# Patient Record
Sex: Female | Born: 1950
Health system: Southern US, Community
[De-identification: ages and names within clinical notes are randomized; demographics above are authoritative.]

## PROBLEM LIST (undated history)

## (undated) ENCOUNTER — Inpatient Hospital Stay: Admission: AD | Payer: Self-pay | Source: Ambulatory Visit | Admitting: Internal Medicine

## (undated) ENCOUNTER — Inpatient Hospital Stay: Payer: Self-pay | Admitting: Family Medicine

## (undated) DIAGNOSIS — Z828 Family history of other disabilities and chronic diseases leading to disablement, not elsewhere classified: Secondary | ICD-10-CM

## (undated) DIAGNOSIS — F32A Depression, unspecified: Secondary | ICD-10-CM

## (undated) DIAGNOSIS — E785 Hyperlipidemia, unspecified: Secondary | ICD-10-CM

## (undated) DIAGNOSIS — I471 Supraventricular tachycardia, unspecified: Secondary | ICD-10-CM

## (undated) DIAGNOSIS — C50919 Malignant neoplasm of unspecified site of unspecified female breast: Secondary | ICD-10-CM

## (undated) DIAGNOSIS — I10 Essential (primary) hypertension: Secondary | ICD-10-CM

## (undated) DIAGNOSIS — E039 Hypothyroidism, unspecified: Secondary | ICD-10-CM

## (undated) DIAGNOSIS — Z9289 Personal history of other medical treatment: Secondary | ICD-10-CM

## (undated) DIAGNOSIS — R002 Palpitations: Secondary | ICD-10-CM

## (undated) DIAGNOSIS — Z9889 Other specified postprocedural states: Secondary | ICD-10-CM

## (undated) DIAGNOSIS — Z720 Tobacco use: Secondary | ICD-10-CM

## (undated) DIAGNOSIS — F329 Major depressive disorder, single episode, unspecified: Secondary | ICD-10-CM

## (undated) DIAGNOSIS — K219 Gastro-esophageal reflux disease without esophagitis: Secondary | ICD-10-CM

## (undated) DIAGNOSIS — F419 Anxiety disorder, unspecified: Secondary | ICD-10-CM

## (undated) DIAGNOSIS — I340 Nonrheumatic mitral (valve) insufficiency: Secondary | ICD-10-CM

## (undated) HISTORY — DX: Major depressive disorder, single episode, unspecified: F32.9

## (undated) HISTORY — DX: Malignant neoplasm of unspecified site of unspecified female breast: C50.919

## (undated) HISTORY — DX: Palpitations: R00.2

## (undated) HISTORY — DX: Supraventricular tachycardia, unspecified: I47.10

## (undated) HISTORY — DX: Hypothyroidism, unspecified: E03.9

## (undated) HISTORY — DX: Essential (primary) hypertension: I10

## (undated) HISTORY — DX: Gastro-esophageal reflux disease without esophagitis: K21.9

## (undated) HISTORY — DX: Family history of other disabilities and chronic diseases leading to disablement, not elsewhere classified: Z82.8

## (undated) HISTORY — DX: Tobacco use: Z72.0

## (undated) HISTORY — DX: Supraventricular tachycardia: I47.1

## (undated) HISTORY — DX: Personal history of other medical treatment: Z92.89

## (undated) HISTORY — DX: Nonrheumatic mitral (valve) insufficiency: I34.0

## (undated) HISTORY — DX: Hyperlipidemia, unspecified: E78.5

## (undated) HISTORY — DX: Depression, unspecified: F32.A

## (undated) HISTORY — DX: Other specified postprocedural states: Z98.890

## (undated) HISTORY — DX: Anxiety disorder, unspecified: F41.9

---

## 1995-03-07 HISTORY — PX: MASTECTOMY: SHX3

## 1996-03-06 DIAGNOSIS — Z8489 Family history of other specified conditions: Secondary | ICD-10-CM

## 1996-03-06 HISTORY — DX: Family history of other specified conditions: Z84.89

## 2000-03-02 ENCOUNTER — Other Ambulatory Visit: Admission: RE | Admit: 2000-03-02 | Discharge: 2000-03-02 | Payer: Self-pay | Admitting: Oral Surgery

## 2000-03-15 ENCOUNTER — Encounter: Admission: RE | Admit: 2000-03-15 | Discharge: 2000-04-30 | Payer: Self-pay | Admitting: *Deleted

## 2003-08-23 ENCOUNTER — Emergency Department (HOSPITAL_COMMUNITY): Admission: EM | Admit: 2003-08-23 | Discharge: 2003-08-23 | Payer: Self-pay | Admitting: Emergency Medicine

## 2006-12-02 HISTORY — PX: CARDIAC CATHETERIZATION: SHX172

## 2007-11-05 DIAGNOSIS — Z9889 Other specified postprocedural states: Secondary | ICD-10-CM

## 2007-11-05 DIAGNOSIS — I340 Nonrheumatic mitral (valve) insufficiency: Secondary | ICD-10-CM

## 2007-11-05 HISTORY — DX: Other specified postprocedural states: Z98.890

## 2007-11-05 HISTORY — DX: Nonrheumatic mitral (valve) insufficiency: I34.0

## 2007-11-30 ENCOUNTER — Inpatient Hospital Stay (HOSPITAL_COMMUNITY): Admission: EM | Admit: 2007-11-30 | Discharge: 2007-12-02 | Payer: Self-pay | Admitting: Emergency Medicine

## 2007-11-30 ENCOUNTER — Ambulatory Visit: Payer: Self-pay | Admitting: *Deleted

## 2007-12-02 ENCOUNTER — Encounter (INDEPENDENT_AMBULATORY_CARE_PROVIDER_SITE_OTHER): Payer: Self-pay | Admitting: Internal Medicine

## 2007-12-24 ENCOUNTER — Ambulatory Visit: Payer: Self-pay | Admitting: Cardiology

## 2007-12-24 LAB — CONVERTED CEMR LAB
Cholesterol: 218 mg/dL (ref 0–200)
Total CHOL/HDL Ratio: 5.5
VLDL: 31 mg/dL (ref 0–40)

## 2007-12-25 ENCOUNTER — Ambulatory Visit: Payer: Self-pay

## 2008-02-04 ENCOUNTER — Ambulatory Visit: Payer: Self-pay | Admitting: Cardiology

## 2008-02-16 DIAGNOSIS — F17200 Nicotine dependence, unspecified, uncomplicated: Secondary | ICD-10-CM | POA: Insufficient documentation

## 2008-02-16 DIAGNOSIS — I471 Supraventricular tachycardia: Secondary | ICD-10-CM

## 2008-06-23 ENCOUNTER — Ambulatory Visit: Payer: Self-pay | Admitting: Cardiology

## 2008-06-23 LAB — CONVERTED CEMR LAB
Cholesterol: 255 mg/dL — ABNORMAL HIGH (ref 0–200)
Direct LDL: 187.6 mg/dL
HDL: 43.6 mg/dL (ref >39.00)
Triglycerides: 129 mg/dL (ref 0.0–149.0)

## 2008-06-30 ENCOUNTER — Encounter (INDEPENDENT_AMBULATORY_CARE_PROVIDER_SITE_OTHER): Payer: Self-pay | Admitting: *Deleted

## 2008-06-30 ENCOUNTER — Telehealth (INDEPENDENT_AMBULATORY_CARE_PROVIDER_SITE_OTHER): Payer: Self-pay | Admitting: *Deleted

## 2008-07-01 ENCOUNTER — Telehealth: Payer: Self-pay | Admitting: Cardiology

## 2008-07-09 ENCOUNTER — Telehealth: Payer: Self-pay | Admitting: Cardiology

## 2008-08-19 DIAGNOSIS — I10 Essential (primary) hypertension: Secondary | ICD-10-CM

## 2008-08-19 DIAGNOSIS — R002 Palpitations: Secondary | ICD-10-CM

## 2008-08-31 ENCOUNTER — Ambulatory Visit: Payer: Self-pay | Admitting: Cardiology

## 2008-08-31 DIAGNOSIS — R5383 Other fatigue: Secondary | ICD-10-CM

## 2008-08-31 DIAGNOSIS — E785 Hyperlipidemia, unspecified: Secondary | ICD-10-CM

## 2008-09-14 ENCOUNTER — Telehealth: Payer: Self-pay | Admitting: Cardiology

## 2008-11-16 ENCOUNTER — Ambulatory Visit: Payer: Self-pay | Admitting: Cardiology

## 2008-11-24 LAB — CONVERTED CEMR LAB
Bilirubin, Direct: 0 mg/dL (ref 0.0–0.3)
Direct LDL: 168.1 mg/dL
HDL: 42.2 mg/dL (ref >39.00)
Total Bilirubin: 0.8 mg/dL (ref 0.3–1.2)
Total CHOL/HDL Ratio: 5
Triglycerides: 97 mg/dL (ref 0.0–149.0)
VLDL: 19.4 mg/dL (ref 0.0–40.0)

## 2009-01-14 ENCOUNTER — Telehealth: Payer: Self-pay | Admitting: Cardiology

## 2009-03-12 ENCOUNTER — Ambulatory Visit: Payer: Self-pay | Admitting: Cardiology

## 2009-03-18 ENCOUNTER — Ambulatory Visit: Payer: Self-pay | Admitting: Cardiology

## 2009-03-18 LAB — CONVERTED CEMR LAB
Albumin: 3.6 g/dL (ref 3.5–5.2)
Alkaline Phosphatase: 73 units/L (ref 39–117)
Cholesterol: 230 mg/dL — ABNORMAL HIGH (ref 0–200)
Direct LDL: 175.3 mg/dL
HDL: 42 mg/dL (ref >39.00)
Total CHOL/HDL Ratio: 5
Total Protein: 6.8 g/dL (ref 6.0–8.3)
Triglycerides: 87 mg/dL (ref 0.0–149.0)
VLDL: 17.4 mg/dL (ref 0.0–40.0)

## 2009-04-02 ENCOUNTER — Telehealth: Payer: Self-pay | Admitting: Cardiology

## 2009-04-07 ENCOUNTER — Telehealth: Payer: Self-pay | Admitting: Cardiology

## 2009-04-21 ENCOUNTER — Ambulatory Visit: Payer: Self-pay | Admitting: Cardiology

## 2009-04-23 ENCOUNTER — Telehealth: Payer: Self-pay | Admitting: Cardiology

## 2009-04-23 LAB — CONVERTED CEMR LAB
BUN: 6 mg/dL (ref 6–23)
Chloride: 93 meq/L — ABNORMAL LOW (ref 96–112)
GFR calc non Af Amer: 67.65 mL/min (ref >60)
Potassium: 4.1 meq/L (ref 3.5–5.1)
Sodium: 133 meq/L — ABNORMAL LOW (ref 135–145)

## 2009-07-23 ENCOUNTER — Encounter (INDEPENDENT_AMBULATORY_CARE_PROVIDER_SITE_OTHER): Payer: Self-pay | Admitting: *Deleted

## 2009-09-07 ENCOUNTER — Ambulatory Visit: Payer: Self-pay | Admitting: Cardiology

## 2010-04-03 LAB — CONVERTED CEMR LAB
CO2: 31 meq/L (ref 19–32)
Calcium: 9.6 mg/dL (ref 8.4–10.5)
GFR calc non Af Amer: 82.12 mL/min (ref >60)
Potassium: 3.3 meq/L — ABNORMAL LOW (ref 3.5–5.1)
Sodium: 131 meq/L — ABNORMAL LOW (ref 135–145)

## 2010-04-05 NOTE — Assessment & Plan Note (Signed)
Summary: PER CHECK OUT/SF   Visit Type:  Follow-up Primary Provider:  Dr. Arlyce Dice, Silvestre Gunner  CC:  No complains.  History of Present Illness: 60 yo with history of SVT, hypertension, and hyperlipidemia presents for followup.  Her LDL has been quite high.  She had myalgias with Crestor, stomach burning with Lipitor, and both myalgias and stomach burning with pravastatin so was unable to take any of these statins.  She is still smoking.  She has lost about 7 lbs since I last saw her and BP is now under good control.  She has had no chest pain or significant exertional dyspnea (though she is not exerting herself much in general).  She has occasional heart fluttering but no long runs consistent with recurrent SVT.   Labs (4/10): TG 129, HDL 44, LDL 188 Labs (6/10): TSH normal Labs (1/11): LDL 175, HDL 42 Labs (2/11): K 4.1, creatinine 0.9  Current Medications (verified): 1)  Toprol Xl 100 Mg Xr24h-Tab (Metoprolol Succinate) .... Take 1 Tablet By Mouth Once A Day 2)  Nexium 40 Mg Pack (Esomeprazole Magnesium) .... Once Daily 3)  Aspirin 81 Mg Tabs (Aspirin) .... Once Daily 4)  Levothroid 25 Mcg Tabs (Levothyroxine Sodium) .... Once Daily 5)  Vitamin D 1000 Unit Tabs (Cholecalciferol) .... Once Daily 6)  Fish Oil  Oil (Fish Oil) .... Once Daily 7)  Chlorthalidone 25 Mg Tabs (Chlorthalidone) .... One Tablet Daily 8)  Potassium Chloride Crys Cr 20 Meq Cr-Tabs (Potassium Chloride Crys Cr) .... Take One Tablet By Mouth Daily- On Hold For About 2 Weeks  Allergies (verified): No Known Drug Allergies  Past History:  Past Medical History: 1. Supraventricular tachycardia.  The patient had an episode of SVT in September 2009.  Her heart rate was reported to be around 240 by EMS, However, there were no strips to evaluate.  It did terminate with adenosine.  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  The patient did have an event monitor done in October 2009 that showed only normal sinus rhythm. 2. Left  heart catheterization in September 2009.  This showed only  luminal irregularities.  EF was 60%.  Transthoracic echocardiogram also in September 2009 showed an EF of 55-60% with mild mitral regurgitation. 3. History of breast cancer.  The patient did have a mastectomy in 1997.  She had Adriamycin chemotherapy in 1998.  She had radiation to her left chest in 2008. 4. Hypothyroidism. 5. Gastroesophageal reflux disease. 6. Anxiety. 7. Tobacco abuse.  8. HYPERTENSION (ICD-401.9) 9. PALPITATIONS (ICD-785.1) 10. Hyperlipidemia: Unable to tolerate Crestor, Lipitor, or pravastatin.  11. Depression.  Family History: Reviewed history from 03/18/2009 and no changes required. The patient's father had a myocardial infarction at the age of 1.  She had a sister who had a pacemaker put in after an episode of syncope and she has a brother who died of esophageal cancer.   Social History: Reviewed history from 08/31/2008 and no changes required. The patient is a divorced Counsellor.  She does smoke a pack a day, has done so far about 40 years.  She drinks minimal alcohol.   Vital Signs:  Patient profile:   60 year old female Height:      66 inches Weight:      204.50 pounds BMI:     33.13 Pulse rate:   69 / minute Pulse rhythm:   regular Resp:     18 per minute BP sitting:   104 / 80  (right arm) Cuff size:  large  Vitals Entered By: Vikki Ports (September 07, 2009 8:43 AM)  Physical Exam  General:  Well developed, well nourished, in no acute distress. Obese.  Neck:  Neck supple, no JVD. No masses, thyromegaly or abnormal cervical nodes. Lungs:  Clear bilaterally to auscultation and percussion. Heart:  Non-displaced PMI, chest non-tender; regular rate and rhythm, S1, S2 without murmurs, rubs or gallops. Carotid upstroke normal, no bruit.  Pedals normal pulses. No edema, no varicosities. Abdomen:  Bowel sounds positive; abdomen soft and non-tender without masses, organomegaly, or hernias noted.  No hepatosplenomegaly. Extremities:  No clubbing or cyanosis. Neurologic:  Alert and oriented x 3. Psych:  Normal affect.   Impression & Recommendations:  Problem # 1:  HYPERLIPIDEMIA-MIXED (ICD-272.4) Patient has been unable to tolerate multiple statins.  LDL is quite high.  She does not have known CAD.  She has begun to lose some weight so hopefully lipids are improving.  At this time, I think that the best strategy would be diet and exercise.  She could try Zetia, but I do not have any evidence that this would alter her long-term outcomes.   Problem # 2:  HYPERTENSION (ICD-401.9) BP is at goal.  Continue current meds.  Check K on chlorthalidone since she is no longer taking her KCl supplement.   Problem # 3:  PALPITATIONS (ICD-785.1) History of SVT.  No symptomatic recurrence.  Continue Toprol XL.   Problem # 4:  TOBACCO ABUSE (ICD-305.1) Needs to quit smoking.  I have suggested nicotine patches.   Other Orders: TLB-BMP (Basic Metabolic Panel-BMET) (80048-METABOL)  Patient Instructions: 1)  Your physician recommends that you have lab today--BMP 401.1 v58.69/al 2)  Your physician wants you to follow-up in: 1 year with Dr Shirlee Latch.  You will receive a reminder letter in the mail two months in advance. If you don't receive a letter, please call our office to schedule the follow-up appointment.

## 2010-04-05 NOTE — Letter (Signed)
Summary: Appointment - Reminder 2  [facility], Main Office  [address]   [place], [place] [address]   Phone: [phone]  Fax: [phone]     Jul 23, 2009 MRN: [id number]   Theresa Levine [address] [place], [place]  [address]   Dear Ms. [name],  Our records indicate that it is time to schedule a follow-up appointment with Dr. Shirlee Latch. It is very important that we reach you to schedule this appointment. We look forward to participating in your health care needs. Please contact us at the number listed above at your earliest convenience to schedule your appointment.  If you are unable to make an appointment at this time, give [place] a call so we can update our records.     Sincerely,   Migdalia Dk [facility] Scheduling Team

## 2010-04-05 NOTE — Progress Notes (Signed)
Summary: started new meds  Phone Note Call from Patient Call back at Home Phone [phone] Call back at Work Phone [phone]   Caller: Patient Reason for Call: Talk to Nurse Details for Reason: started new med on yesterday has question .  Initial call taken by: Lorne Skeens,  April 07, 2009 11:06 AM  Follow-up for Phone Call        talked with patient--she asked if she needed to take Chlothalidone and Toprol together

## 2010-04-05 NOTE — Progress Notes (Signed)
Summary: B/P readings--04-23-09  Phone Note Outgoing Call   Call placed by: Katina Dung, RN, BSN,  April 23, 2009 10:40 AM Call placed to: Patient Summary of Call: B/P readings  Follow-up for Phone Call        discussed B/P readings with pt--B/P for the last week  110-120/60-78--seems to be staying in this range--pt feeling better --will forward to Dr Shirlee Latch for review     Appended Document: B/P readings--04-23-09 good BP   Appended Document: B/P readings--04-23-09 discussed with patient by telephone

## 2010-04-05 NOTE — Progress Notes (Signed)
Summary: B/P reading**04-02-09  Phone Note Outgoing Call   Call placed by: Katina Dung, RN, BSN,  April 02, 2009 12:38 PM Call placed to: Patient Summary of Call: B/P readings  Follow-up for Phone Call        called pt to get B/P readings -- 03-25-09 109/93 64 03-27-09 174/98  66 03-29-09 155/85  61 03-30-09 170/98 63  168/87  62  146/76  62 148/85  58  03-31-09 146/85  62  146/75  65 04-01-09 150/85 66  145/83  63 04-02-09  151/83  62  162/84  57  reviewed meds with patient  pt took Pravastatin for 1 week --hurt in esophagus and made constipation worse will review with Dr Shirlee Latch         Current Medications (verified): 1)  Toprol Xl 100 Mg Xr24h-Tab (Metoprolol Succinate) .... Take 1 Tablet By Mouth Once A Day 2)  Nexium 40 Mg Pack (Esomeprazole Magnesium) .... Once Daily 3)  Aspirin 81 Mg Tabs (Aspirin) .... Once Daily 4)  Levothroid 25 Mcg Tabs (Levothyroxine Sodium) .... Once Daily 5)  Vitamin D 1000 Unit Tabs (Cholecalciferol) .... Once Daily 6)  Fish Oil  Oil (Fish Oil) .... Once Daily  Allergies: No Known Drug Allergies   Appended Document: B/P reading**04-02-09 BP running high.  Would have her start chlorthalidone 25 mg daily with KCl 20 mEq daily.  BMET and BP check in 2 wks.  She did not tolerate pravastatin. She has been intolerant to Crestor, lipitor, and pravastatin.  Doubt she would be able to tolerate Niaspan.  See if she will try Zetia 10 mg daily.   Appended Document: B/P reading**04-02-09 talked with patient--agreed to start Chlorthalidone and KCL--repeat BMP 04-21-09--will continue to take and record B/P--she will think about Zetia   Clinical Lists Changes  Medications: Added new medication of CHLORTHALIDONE 25 MG TABS (CHLORTHALIDONE) one tablet daily - Signed Added new medication of POTASSIUM CHLORIDE CRYS CR 20 MEQ CR-TABS (POTASSIUM CHLORIDE CRYS CR) Take one tablet by mouth daily - Signed Rx of CHLORTHALIDONE 25 MG TABS (CHLORTHALIDONE) one tablet  daily;  #30 x 3;  Signed;  Entered by: Katina Dung, RN, BSN;  Authorized by: Marca Ancona, MD;  Method used: [facility]  [place] [address], [place], [place]  [address], Ph: [phone] or [phone], Fax: [phone] Rx of POTASSIUM CHLORIDE CRYS CR 20 MEQ CR-TABS (POTASSIUM CHLORIDE CRYS CR) Take one tablet by mouth daily;  #30 x 3;  Signed;  Entered by: Katina Dung, RN, BSN;  Authorized by: Marca Ancona, MD;  Method used: [facility]  [place] [address], [place], [place]  [address], Ph: [phone] or [phone], Fax: [phone] Observations: Added new observation of MEDRECON: current updated (04/06/2009 9:14)    Prescriptions: POTASSIUM CHLORIDE CRYS CR 20 MEQ CR-TABS (POTASSIUM CHLORIDE CRYS CR) Take one tablet by mouth daily  #30 x 3   Entered by:   Katina Dung, RN, BSN   Authorized by:   Marca Ancona, MD   Signed by:   Katina Dung, RN, BSN on 04/06/2009   Method used:   [facility]        [facility]  [place] [address]       [address]       [place], [place]  [address]       Ph: [phone] or [phone]       Fax: [phone]   RxID:  [id number] CHLORTHALIDONE 25 MG TABS (CHLORTHALIDONE) one tablet daily  #30 x 3   Entered by:   Katina Dung, RN, BSN   Authorized by:   Marca Ancona, MD   Signed by:   Katina Dung, RN, BSN on 04/06/2009   Method used:   [facility]        [facility]  [place] [address] (retail)       [address]       [place], [place]  [address]       Ph: [phone] or [phone]       Fax: [phone]   RxID:   [id number]     Current Medications (verified): 1)  Toprol Xl 100 Mg Xr24h-Tab (Metoprolol Succinate) .... Take 1 Tablet By Mouth Once A Day 2)  Nexium 40 Mg Pack (Esomeprazole Magnesium) .... Once Daily 3)  Aspirin 81 Mg Tabs (Aspirin) .... Once Daily 4)  Levothroid 25 Mcg Tabs (Levothyroxine Sodium) .... Once Daily 5)  Vitamin D 1000 Unit Tabs (Cholecalciferol)  .... Once Daily 6)  Fish Oil  Oil (Fish Oil) .... Once Daily 7)  Chlorthalidone 25 Mg Tabs (Chlorthalidone) .... One Tablet Daily 8)  Potassium Chloride Crys Cr 20 Meq Cr-Tabs (Potassium Chloride Crys Cr) .... Take One Tablet By Mouth Daily  Allergies: No Known Drug Allergies

## 2010-04-05 NOTE — Assessment & Plan Note (Signed)
Summary: PER CHECK OUT/SF   CC:  pt following up on blood work.  Pt reports that she is feeling well. She has discontinued the Lipitor and Chantix.  She did not like the way they made her feel.  Theresa Levine  History of Present Illness: 60 yo with history of SVT, hypertension, and hyperlipidemia presents for followup.  Her LDL is quite high.  She had myalgias with Crestor and stomach burning with Lipitor so was unable to take either.  She is still smoking.  BP is elevated today at 152/89.  She has gained about 8 lbs since last appointment and is not exercising.  She is now on Prozac for depression.  She has had no chest pain or significant exertional dyspnea (though she is not exerting herself much in general).  No recurrence of SVT or significant palpitations.    Labs (4/10): TG 129, HDL 44, LDL 188 Labs (6/10): TSH normal Labs (1/11): LDL 175, HDL 42  Current Medications (verified): 1)  Toprol Xl 100 Mg Xr24h-Tab (Metoprolol Succinate) .... Take 1 Tablet By Mouth Once A Day 2)  Nexium 40 Mg Pack (Esomeprazole Magnesium) .... Once Daily 3)  Aspirin 81 Mg Tabs (Aspirin) .... Once Daily 4)  Levothroid 25 Mcg Tabs (Levothyroxine Sodium) .... Once Daily 5)  Vitamin D 1000 Unit Tabs (Cholecalciferol) .... Once Daily 6)  Fish Oil  Oil (Fish Oil) .... Once Daily 7)  Pravastatin Sodium 40 Mg Tabs (Pravastatin Sodium) .... One Tablet in The Evening  Allergies (verified): No Known Drug Allergies  Past History:  Past Surgical History: Last updated: 08/19/2008   CARDIAC CATHETERIZATION   12/02/2007  Bruce R. Juanda Chance, MD  lumpectomy and radical node dissection of the left breast  Family History: Last updated: 03/18/2009 The patient's father had a myocardial infarction at the age of 55.  She had a sister who had a pacemaker put in after an episode of syncope and she has a brother who died of esophageal cancer.   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  She drinks minimal alcohol.   Past Medical History: 1. Supraventricular tachycardia.  The patient had an episode of SVT in September 2009.  Her heart rate was reported to be around 240 by EMS, However, there were no strips to evaluate.  It did terminate with adenosine.  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  The patient did have an event monitor done in October 2009 that showed only normal sinus rhythm. 2. Left heart catheterization in September 2009.  This showed only  luminal irregularities.  EF was 60%.  Transthoracic echocardiogram also in September 2009 showed an EF of 55-60% with mild mitral regurgitation. 3. History of breast cancer.  The patient did have a mastectomy in 1997.  She had Adriamycin chemotherapy in 1998.  She had radiation to her left chest in 2008. 4. Hypothyroidism. 5. Gastroesophageal reflux disease. 6. Anxiety. 7. Tobacco abuse.  8. HYPERTENSION (ICD-401.9) 9. PALPITATIONS (ICD-785.1) 10. Hyperlipidemia: Unable to tolerate Crestor or Lipitor.  11. Depression.  Family History: Reviewed history from 08/31/2008 and no changes required. The patient's father had a myocardial infarction at the age of 1.  She had a sister who had a pacemaker put in after an episode of syncope and she has a brother who died of esophageal cancer.   Social History: Reviewed history from 08/31/2008 and no changes required. The  patient is a divorced Counsellor.  She does smoke a pack a day, has done so far about 40 years.  She drinks minimal alcohol.   Review of Systems       All systems reviewed and negative except as per HPI.   Vital Signs:  Patient profile:   60 year old female Height:      66 inches Weight:      211 pounds BMI:     34.18 Pulse rate:   60 / minute Pulse rhythm:   regular BP sitting:   152 / 89  (right arm) Cuff size:   large  Vitals Entered By: Judithe Modest CMA (March 18, 2009 8:42 AM)  Physical  Exam  General:  Well developed, well nourished, in no acute distress. Obese.  Head:  normocephalic and atraumatic Nose:  no deformity, discharge, inflammation, or lesions Mouth:  Teeth, gums and palate normal. Oral mucosa normal. Neck:  Neck supple, no JVD. No masses, thyromegaly or abnormal cervical nodes. Lungs:  Clear bilaterally to auscultation and percussion. Heart:  Non-displaced PMI, chest non-tender; regular rate and rhythm, S1, S2 without murmurs, rubs or gallops. Carotid upstroke normal, no bruit.  Pedals normal pulses. No edema, no varicosities. Abdomen:  Bowel sounds positive; abdomen soft and non-tender without masses, organomegaly, or hernias noted. Obese.  Extremities:  No clubbing or cyanosis. Neurologic:  Alert and oriented x 3. Psych:  Normal affect.   Impression & Recommendations:  Problem # 1:  HYPERTENSION (ICD-401.9) BP is elevated today.  This is in the setting of an 8 lb weight gain.  I will have her check BPs at home for 2 wks and we will call her to see what BP is running.  If high, will start HCTZ.  I also counseled her on the need for weight loss.   Problem # 2:  HYPERLIPIDEMIA-MIXED (ICD-272.4) Intolerance to Lipitor because of stomach burning.  Will try pravastatin 40 mg daily. Lipids/LFTs in 2 months.   Problem # 3:  SUPRAVENTRICULAR TACHYCARDIA (ICD-427.89) No symptomatic recurrences.  Continue Toprol XL.   Problem # 4:  TOBACCO ABUSE (ICD-305.1) Encouraged her to use a nicotine patch to try to quit.  She could not tolerate Chantix.   Patient Instructions: 1)  Your physician has recommended you make the following change in your medication:  2)  Start Pravastatin 40mg  one in the evening 3)  Your physician recommends that you return for a FASTING lipid profile/liver profile in 2 months --272.0   401.9  4)  Take and record your blood pressure and pulse--I will call you in 2 weeks to get the readings Luana Shu [phone]  5)  Your physician  recommends that you schedule a follow-up appointment in: 6 months with Dr. Marca Ancona  Prescriptions: PRAVASTATIN SODIUM 40 MG TABS (PRAVASTATIN SODIUM) one tablet in the evening  #30 x 3   Entered by:   Katina Dung, RN, BSN   Authorized by:   Marca Ancona, MD   Signed by:   Katina Dung, RN, BSN on 03/18/2009   Method used:   [facility]        [facility]  [place] [address] [facility]       [address]       [place], [place]  [address]       Ph: [phone] or [phone]       Fax: [phone]   RxID:   [id number]

## 2010-04-05 NOTE — Cardiovascular Report (Signed)
Summary: [facility]   [facility]   Imported By: Roderic Ovens 03/18/2009 14:26:27  _____________________________________________________________________  External Attachment:    Type:   Image     Comment:   External Document

## 2010-07-19 NOTE — Assessment & Plan Note (Signed)
[facility]                            CARDIOLOGY OFFICE NOTE   Theresa Levine, Theresa Levine                       MRN:          [id number]  DATE:12/24/2007                            DOB:          [birth year]    PRIMARY CARE PHYSICIAN:  Rema Fendt, FNP, at [facility]   HISTORY OF PRESENT ILLNESS:  This is a 60 year old with a history of  hypertension, anxiety, and hypothyroidism who presents for evaluation of  symptomatic supraventricular tachycardia.  The patient had her first  episode of palpitations in early September 2009.  This episode was  relatively mild, but did make her nauseated.  It resolved in about 5 or  10 minutes.  She then had a second episode of severe palpitations with  her heart racing and severe substernal chest pain later in September  2009.  She called EMS at this time and she was found to be in a  supraventricular tachycardia with a rate in the 240s according to  report.  This resolved with 6 mg of adenosine.  There were no strips  available to evaluate and we are not sure exactly what type of SVT that  she did have.  She came into the emergency department.  Her troponin was  elevated to maximum of 0.32.  She did have a PE CT that was negative for  PE.  Given her mildly elevated troponin and chest pain, she had a left  heart catheterization.  This showed only mild luminal irregularities.  It was thought that her chest pain was demand ischemia from the  extremely high heart rate with her SVT.  She was started on Toprol-XL  100 mg daily, had no further arrhythmias during her stay in the  hospital, and was discharged home.  Since discharge home, the patient  has been doing well.  She has had no episodes of palpitations or  tachycardia.  She has had no chest pain.  She does state that prior to  the two episodes of palpitations in September, she had never noticed  such symptoms before.  She is now back at work.  She  is avoiding  caffeine and over-the-counter cold medications.  She does not exercise  much.  She does not get a whole lot of exercise.  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  Finally, she does continue to smoke.  She did  nicotine gum, but has not tried the use yet.  The patient states that  for the last 6 months, she has had a lot of stress in her life and she  thinks that may be one of the possible triggers of her SVT episodes.   MEDICATIONS:  1. Toprol-XL 100 mg daily.  2. Aspirin 81 mg daily.  3. Nexium 40 mg daily.  4. Levoxyl 25 mcg daily.  5. Famotidine 10 mg daily.  6. Xanax p.r.n.   PAST MEDICAL HISTORY:  1. Supraventricular tachycardia.  The patient had an episode of SVT in      September 2009.  Her heart rate was reported as being around 240 by  EMS; however, there are no strips to evaluate.  We are not sure      what type of SVT she had.  It was terminated with adenosine and has      not recurred.  2. September 2009, left heart catheterization with luminal      irregularities only, EF was 60%.  Transthoracic echocardiogram in      September 2009 showed an EF of 55-60% with mild mitral      regurgitation.  3. History of breast cancer.  The patient did have a mastectomy in      1997.  She had Adriamycin chemotherapy in 1998.  She had radiation      to her left chest in 2008.  4. Hypothyroidism.  5. Gastroesophageal reflux disease.  6. Anxiety.  7. Tobacco abuse.   MEDICATION INTOLERANCES AND ALLERGIES:  The patient did have mild  allergies with use of ZOCOR in the past.   SOCIAL HISTORY:  The patient is a divorced Counsellor.  She does  smoke a pack a day, has done so far about 40 years.  She drinks minimal  alcohol.   FAMILY HISTORY:  The patient's father had a myocardial infarction at the  age of 21.  She had a sister who had a pacemaker put in after an episode  of syncope and she has a brother who died of esophageal cancer.   REVIEW OF SYSTEMS:   Negative except as noted in the history of present  illness.   PHYSICAL EXAMINATION:  VITAL SIGNS:  Blood pressure 134/84, heart rate  76 and regular, weight is 202 pounds.  GENERAL:  This is an obese female in no apparent distress.  NEUROLOGIC:  Alert and oriented x3.  Normal affect.  CARDIOVASCULAR:  Heart regular, S1 and S2.  No S3, no S4.  No murmur.  There is no carotid bruit.  There is no peripheral edema.  There is  dorsalis pedis pulses bilaterally.  ABDOMEN:  Soft, obese, nontender.  No hepatosplenomegaly.  LUNGS:  Clear to auscultation bilaterally with normal respiratory  effort.  NECK:  No thyromegaly, no thyroid nodule, no JVD.  HEENT:  Normal.  EXTREMITIES:  No clubbing or cyanosis.  MUSCULOSKELETAL:  Normal.  SKIN:  Normal.   EKG today shows normal sinus rhythm.  It is a normal EKG.  There is no  evidence for pre-excitation or long QT   ASSESSMENT AND PLAN:  This is a 60 year old with two episodes of  symptomatic palpitations.  She was noted to have supraventricular  tachycardia with a rate in the 240s at the time of the palpitations.  1. Supraventricular tachycardia.  The patient's rate got as high as      about 240 according to the EMS and the supraventricular tachycardia      resolved with adenosine.  Most likely, this is atrioventricular      node reentry tachycardia; however, we do not have any actual      documentation of the episodes and we are not sure exactly what type      of supraventricular tachycardia she had.  Atrial fibrillation would      be unlikely to resolve with adenosine and even if this were atrial      fibrillation, her CHADS2 score is only 1, so it would be reasonable      for her to be maintained on aspirin alone without Coumadin.  She is      currently on Toprol-XL 100 mg daily  and has had no further      symptoms.  I am going to continue her on metoprolol.  I will set      her up for 83-month event monitor.  We will see if we can catch any       episodes of supraventricular tachycardia to help determine exactly      what it is that she has.  If it does turn out she has      atrioventricular nodal reentry tachycardia and she has breakthrough      episodes while on her metoprolol, she would be at that point be a      candidate for supraventricular tachycardia ablation especially      given her symptoms and chest pain with elevated troponin at her      most recent hospitalization.  2. Coronary artery disease.  The patient had luminal irregularities      only in her coronaries.  She did have mildly elevated troponin at      the time of her supraventricular tachycardia admission.  This was      likely a demand ischemia in the setting of very high heart rate.  3. Hypothyroidism.  The patient's free T4 and TSH were normal when      checked in September.  4. We do not have lipids available.  We will go ahead and check the      patient's lipids today.  5. I did talk to the patient about smoking cessation.  She will start      trying to use her nicotine gum.  6. The patient was told to avoid caffeine and over-the-counter cold      medications.     Marca Ancona, MD  Electronically Signed    DM/MedQ  DD: 12/24/2007  DT: 12/25/2007  Job #: [id number]   cc:   Rema Fendt, FNP

## 2010-07-19 NOTE — Assessment & Plan Note (Signed)
[facility]                            CARDIOLOGY OFFICE NOTE   SIMI, BRIEL                       MRN:          [id number]  DATE:02/04/2008                            DOB:          02-28-1951    PRIMARY CARE PHYSICIAN:  Rema Fendt, NP.   HISTORY OF PRESENT ILLNESS:  This is a 60 year old with a history of  hypertension, anxiety, hypothyroidism, and symptomatic supraventricular  tachycardia who presents for followup.  The patient did have an episode  of symptomatic SVT in September 2009.  At this time EMS found her heart  rate to be in the 240s.  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 Since that time the patient has been on  Toprol-XL 100 mg daily.  She has had no further significant symptoms.  She did had an event monitor done in October and that showed only normal  sinus rhythm. She has been doing well.  No syncope or presyncope. She is  not short of breath or exertion.  She has had no episodes of chest pain.  She does have some acid reflux symptoms after meals which is controlled  by famotidine.  She does continue to smoke about a pack a day.   MEDICATIONS:  1. Toprol-XL 100 mg daily.  2. Aspirin 81 mg daily.  3. Nexium 40 mg daily.  4. Levoxyl 25 mcg daily.  5. Xanax 0.25 mg p.r.n.  6. Famotidine 10 mg daily.  7. Omega 3 fatty acids 1200 mg daily.   PAST MEDICAL HISTORY:  1. Supraventricular tachycardia.  The patient had an episode of SVT in      September 2009.  Her heart rate was reported to be around 240 by      EMS, However, there were no strips to evaluate.  It did terminate      with adenosine.  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  The patient      did have an event monitor done in October 2009, that showed only      normal sinus rhythm.  2. Left heart catheterization in September 2009.  This showed only  luminal irregularities.  EF was 60%.  Transthoracic echocardiogram      also in September 2009 showed an EF of 55-60% with mild mitral      regurgitation.  3. History of breast cancer.  The patient did have a mastectomy in      1997.  She had Adriamycin chemotherapy in 1998.  She had radiation      to her left chest in 2008.  4. Hypothyroidism.  5. Gastroesophageal reflux disease.  6. Anxiety.  7. Tobacco abuse.   MEDICATION INTOLERANCES:  The patient did have weakness in her arms and  legs with the use of Zocor in the past.   SOCIAL HISTORY:  The patient is a divorced, Counsellor.  She does  smoke a pack a day.  She has done this for over 40  years.  She drinks  minimal alcohol.   FAMILY HISTORY:  The patient's father had myocardial infarction at the  age of 49.  She had a sister who had a pacemaker put in after an episode  of syncope, and a brother who died of esophageal cancer.   LABORATORY DATA:  Her most recent labs in October 2009.  Total  cholesterol 218, triglycerides 155, HDL 39.5, and LDL 137.   PHYSICAL EXAMINATION:  VITAL SIGNS:  Blood pressure 144/84, weight is  206 pounds up from her 202 pounds at last appointment, heart rate 68 and  regular.  GENERAL:  This is an obese female in no apparent distress.  NEUROLOGIC:  Alert and oriented x3.  Normal affect.  CARDIOVASCULAR:  Heart regular.  S1 and S2.  No S3. No S4.  No murmur.  No carotid bruit.  No peripheral edema.  ABDOMEN:  Soft, obese, and nontender.  No hepatosplenomegaly.  LUNGS:  Clear to auscultation bilaterally with no respiratory effort.  NECK:  No thyromegaly.  No thyroid nodule.  No JVD.   ASSESSMENT AND PLAN:  This 60 year old with history of hypertension and  hyperlipidemia had an episode of symptomatic supraventricular  tachycardia  who presents to Cardiology Clinic for followup.  1. Supraventricular tachycardia.  The patient likely had an episode of      atrioventricular nodal reentry  tachycardia as the heart rate was      240 and the arrhythmia terminated with adenosine. She has been      started on Toprol-XL and has had no further symptoms and had an      event monitor that showed only sinus rhythm.  At this time we will      continue to monitor her for further symptoms.  She will continue on      her Toprol-XL. If she does have breakthrough atrioventricular nodal      reentry tachycardia while taking Toprol-XL, we would probably refer      her for atrioventricular nodal reentry tachycardia ablation if it      is indeed atrioventricular nodal reentry tachycardia.  2. Coronary artery disease.  The patient had luminal irregularities on      her left heart catheterization in September. She had mildly      elevated troponin at the time of her supraventricular tachycardia      admission.  However, this is likely demand ischemia in the setting      of a very high heart rate.  3. Hypothyroidism.  The patient's free T4 and TSH were normal when      checked in September.  4. Hyperlipidemia.  The patient's LDLs were 137.  This is higher than      I would like it to be especially given that she does have the      evidence for some mild coronary artery disease.  She has had      trouble with taking Zocor in the past.  We will have her start on      Crestor 5 mg every other day and see if she has any symptoms with      this.  We will have her back in 3 months to check her lipids.  She      will also increase her omega 3 fatty acids to 2400 mg a day.  5. Smoking.  I did talk to the patient at length about smoking      cessation.  She will try Chantix.  6. I did talk to the patient also about attempt at weight loss with      increased activity and dieting.      She will try to walk 4-5 times a week on her lunch break.  7. The patient was told again to avoid caffeine and over-the-counter      cold medicines.     Marca Ancona, MD  Electronically Signed    DM/MedQ  DD:  02/04/2008  DT: 02/04/2008  Job #: [id number]   cc:   Rema Fendt

## 2010-07-19 NOTE — Consult Note (Signed)
NAME:  Theresa Levine, Theresa Levine NO.:  [id number]   MEDICAL RECORD NO.:  [id number]          PATIENT TYPE:  EMS   LOCATION:  [facility]                         FACILITY:  [facility]   PHYSICIAN:  Renee Ramus, MD       DATE OF BIRTH:  22-Apr-1950   DATE OF CONSULTATION:  11/30/2007  DATE OF DISCHARGE:                                 CONSULTATION   HISTORY OF PRESENT ILLNESS:  The patient is a 60 year old female with  palpitations occurring 1 hour prior to admission.  The patient had a  single episode of approximately 30 minutes of palpitations associated  with tightness in her chest, and some mild dizziness.  The patient  denies having any associated emesis, fevers, chills, night sweats,  diarrhea, constipation, chest pain, or syncope.  The patient says she  has had a previous episode approximately 2 weeks prior.  She has  recently been started on Synthroid, but her first episode preceded her  Synthroid and it is a very small dose at 25 mcg p.o. daily.  The patient  does have a sister that had a problem with bradycardia, and is now  status post pacemaker.  The patient is currently asymptomatic, and  wishes to go home.   PAST MEDICAL HISTORY:  1. Anxiety.  2. Breast cancer.  3. Gastroesophageal reflux disease.  4. Hypothyroid.  5. Status post mastectomy.   FAMILY HISTORY:  Not available.   SOCIAL HISTORY:  The patient does smoke, but note, denies alcohol or any  type of drugs of abuse.  Denies excessive caffeine use.   DRUG ALLERGIES:  NKDA.   MEDICATIONS:  Currently:  1. Nexium 40 mg p.o. daily.  2. Synthroid 25 mcg p.o. daily.  3. Xanax 1 half tablet p.o. daily.   REVIEW OF SYSTEMS:  All other comprehensive review of systems are  negative.   PHYSICAL EXAMINATION:  GENERAL:  This is a 60 year old white female  currently in no apparent distress.  VITAL SIGNS:  Temperature 98.1; pulse 109; respiratory rate 17; and  blood pressure 160/87 initially and decreasing to  123/89.  HEENT:  No jugular venous distention or lymphadenopathy.  Oropharynx is  clear.  Mucous membranes pink and moist.  TMs are clear bilaterally.  Pupils are equal and reactive to light and accommodation.  Extraocular  muscles are intact.  CARDIOVASCULAR:  She has a regular rate and rhythm without murmurs,  rubs, or gallops.  PULMONARY:  Lungs are clear to auscultation bilaterally.  ABDOMEN:  Soft.  Somewhat obese.  Nontender and nondistended without  hepatosplenomegaly.  Bowel sounds are present.  She has no rebound or  guarding.  EXTREMITIES:  She has no clubbing, cyanosis, or edema.  She has good  peripheral pulses in dorsalis pedis, radial artery.  She is able to move  all extremities.  NEUROLOGIC:  Cranial nerves II through XII are grossly intact.  She has  no focal neurological deficits.   LABORATORY DATA:  White count 5.9, H and H 15.2, MCV 91, platelets 171,  and hematocrit of 44.  Sodium 137, potassium 3.5, chloride 109, bicarb  23,  BUN 6, creatinine 0.8, and glucose 118.   STUDIES:  1. EKG shows sinus tachycardia without evidence of acute ischemia or      infarction.  2. Chest x-ray shows postoperative changes in the left wall and      axilla, but no acute cardiopulmonary abnormality.   ASSESSMENT AND PLAN:  1. Chest pressure with supraventricular tachycardia of unknown origin.      The patient is currently in sinus rhythm, and per EMS she was in a      sinus rhythm during her tachycardiac episode.  I am unsure what is      causing these episodes.  It is possible that the patient is      experiencing AVNRT or AVRT.  It is possible that the patient is      experiencing panic attacks, although I find this less likely.  A D-      dimer is currently pending, but I do not believe the patient has      high probability for pulmonary embolism.  I have advised the      patient that we could keep her here to further investigate her      symptoms, keep her on telemetry, and  possibly on Monday get an      echocardiogram or she could be discharged to home with follow up      with Cardiology as an outpatient for probable event monitor and      echocardiogram done as an outpatient.  She prefers this option.      The patient will be discharged to home, pending review of D-dimer      and TSH, both of which were pending.  2. Anxiety, currently stable as above.  The patient does not have      history of panic attacks consistent with these events, and do not      believe that this is the etiology of her symptoms.  3. Gastroesophageal reflux disease.  Continue proton pump inhibitor.  4. Tobacco use.  The patient has been counseled with respect to      tobacco smoking, especially in light of her history of breast      cancer and she recognizes that this is a problem.  5. Hypothyroid.  TSH is currently pending, and I believe it is safe      for her to continue taking her Synthroid.  6. Disposition.  As above, we will review D-dimer and TSH, and if      negative then discharge to home.   A consult note was constructed by reviewing past medical history,  conferring with the emergency medical room physician and reviewing the  emergency medical record.   TIME SPENT:  One hour.      Renee Ramus, MD  Electronically Signed     JF/MEDQ  D:  11/30/2007  T:  12/01/2007  Job:  [id number]

## 2010-07-19 NOTE — Cardiovascular Report (Signed)
NAMEJENNETTE, LEASK NO.:  [id number]   MEDICAL RECORD NO.:  [id number]          PATIENT TYPE:  INP   LOCATION:  2022                         FACILITY:  [facility]   PHYSICIAN:  Bruce R. Juanda Chance, MD, FACCDATE OF BIRTH:  [birth year]   DATE OF PROCEDURE:  12/02/2007  DATE OF DISCHARGE:  12/02/2007                            CARDIAC CATHETERIZATION   CLINICAL HISTORY:  Mrs. Al-Shawa is 60 years old and on Saturday  developed the onset of rapid palpitations and chest pain.  She was  admitted to the hospital and converted before she arrived at the  hospital.  She was told her rate was 240 beats per minute by EMS.  We  did not have any documentation of the strips.  Her troponins returned  positive and she was scheduled for evaluation and catheterization today.  She had an echocardiogram today which preliminary was normal.   PROCEDURE:  The procedure was performed via the right femoral artery  using arterial sheath and 5-French preformed coronary catheters.  A  front-wall arteriogram was performed and Omnipaque contrast was used.  The patient tolerated the procedure well and left the laboratory in  satisfactory condition.   RESULTS:  Left main coronary artery:  The left main coronary artery was  free of significant disease.   Left anterior descending artery:  The left anterior descending artery  gave rise to 2 diagonal branches and several septal perforators.  The  LAD was ectatic in its proximal portion and was irregular throughout.  There was no major obstruction.   Circumflex artery:  The circumflex artery gave rise to an atrial branch  and 2 small marginal branches and second large posterolateral branch.  This was irregular and there was some ectasia in the proximal portion  with no significant obstruction.   Right coronary artery:  The right coronary artery was a moderately large  vessel that gave rise to conus branch, 2 right ventricular branches, a  posterior  descending branch, and 2 posterolateral branches.  There was  30% narrowing in the proximal vessel and 30% narrowing in the mid vessel  and there were irregularities in the proximal mid vessel.   Left ventriculogram:  The left ventriculogram performed in the RAO  projection showed good wall motion with no areas of hypokinesis.  The  estimated ejection fraction was 60%.   CONCLUSION:  Nonobstructive coronary artery disease with ectasia and  irregularities in the left anterior descending and circumflex artery  with TIMI 2 flow in the left anterior descending and 30% narrowing in  the proximal and 30% narrowing in the mid-right coronary artery with  normal left ventricular function.   RECOMMENDATIONS:  It appears that the patient had a demand non-ST-  elevation myocardial infarction related to her arrhythmia which was not  documented.  I think she should have medical treatment for an acute  coronary syndrome including aspirin, Plavix, and statin, and I think she  should be treated with a beta-blocker for her SVT.  Her CHADS score is 0.  So, even if this was atrial fibrillation, I do  not think she will require  Coumadin and also for this reason I think it  is important that we document the arrhythmia and that she has atrial  fibrillation with an event monitor.  I think she can go home and we will  arrange cardiac followup in a couple of weeks.      Bruce Elvera Lennox Juanda Chance, MD, [facility]  Electronically Signed     BRB/MEDQ  D:  12/02/2007  T:  12/03/2007  Job:  [id number]   cc:   [facility]  Rosezella Florida, FNP  Duke Salvia, MD, [facility]  Marca Ancona, MD

## 2010-07-19 NOTE — H&P (Signed)
NAMEMarland Levine  Theresa, TEBBETTS NO.:  [id number]   MEDICAL RECORD NO.:  [id number]          PATIENT TYPE:  INP   LOCATION:  2022                         FACILITY:  [facility]   PHYSICIAN:  Della Goo, M.D. DATE OF BIRTH:  [birth year]   DATE OF ADMISSION:  11/30/2007  DATE OF DISCHARGE:                              HISTORY & PHYSICAL   PRIMARY CARE PHYSICIAN:  [facility]   CHIEF COMPLAINT:  Palpitations.   HISTORY OF PRESENT ILLNESS:  This is a 60 year old female who presents  to the emergency department with complaints of palpitations and symptoms  of her heart racing which started 2 hours prior to her arrival in the  emergency department.  She reports the episode lasted approximately 30  minutes and the patient does report having associated symptoms of chest  tightness, shortness of breath and lightheadedness.  The patient denies  having any nausea, vomiting or syncopal episodes.   The patient was initially evaluated in the emergency department by Dr.  Janice Norrie.  Please refer to the consultation by Dr. Renee Ramus.  The  patient also had laboratory studies which were ordered by Dr. Janice Norrie  which returned with an elevated D-dimer, and the patient was referred  for admission.  Her initial cardiac enzymes were negative with  creatinine kinase of 99, CK-MB of 1.9.  A troponin level of 0.04 and an  EKG which revealed a normal sinus rhythm without acute ST-segment  changes..  The patient also had a beta natriuretic peptide performed,  results of which returned less than 30.0.  The patient also had a chest  x-ray which returned without acute findings.  When the results of the D-  dimer returned, the patient was converted to admission status, and her D-  dimer had been found to be 1.18.  A CT angiogram study was ordered along  with ultrasound studies of both lower extremities.  However, the CT  angiogram study would be performed this evening.   PAST MEDICAL  HISTORY:  Significant for breast CA in the past with  lumpectomy and radical node dissection of the left breast,  hypothyroidism, gastroesophageal reflux disease, and anxiety.   MEDICATIONS:  1. Levothyroxine 25 mcg one p.o. daily.  2. Xanax 0.25 mg one half a tablet p.o. b.i.d. p.r.n.   ALLERGIES:  NO KNOWN DRUG ALLERGIES.   SOCIAL HISTORY:  The patient is a nonsmoker, nondrinker.   FAMILY HISTORY:  Noncontributory.   REVIEW OF SYSTEMS:  Pertinents are mentioned above.   PHYSICAL EXAMINATION:  GENERAL:  This is a pleasant 60 year old well-  nourished, well-developed female in no discomfort or acute distress.  VITAL SIGNS:  Temperature 98.1, blood pressure 160/87, heart rate 109,  respirations 17, O2 saturations 100% on 2 liters nasal cannula oxygen.  HEENT: Normocephalic, atraumatic.  Pupils equally round reactive to  light.  Extraocular movements are intact.  Funduscopic benign.  Oropharynx is clear.  NECK:  Supple.  Full range of motion.  No thyromegaly, adenopathy,  jugulovenous distention.  CARDIOVASCULAR:  Regular rate and rhythm.  No murmurs, gallops or rubs.  LUNGS:  Clear to auscultation  bilaterally.  ABDOMEN:  Positive bowel sounds, soft, nontender, nondistended.  EXTREMITIES:  Without cyanosis, clubbing or edema.  NEUROLOGIC:  Examination nonfocal.   LABORATORY STUDIES:  White blood cell count 5.9, hemoglobin 15.2,  hematocrit 44.5, platelets 171, neutrophils 60%, lymphocytes 29%.  Chemistry with a sodium of 137, potassium 3.5, chloride 109, CO2 23, BUN  6, creatinine 0.79, glucose 118.  Cardiac enzymes as mentioned above.  CK 99, CK-MB 1.9 and troponin 0.04, beta natriuretic peptide less than  30.0 and D-dimer 1.18.  EKG findings mentioned above and chest x-ray  findings mentioned above.   ASSESSMENT:  A 60 year old female being admitted with:  1. Palpitations.  2. Elevated D-dimer.  3. Hypertension.  4. Hypothyroidism.   PLAN:  The patient will be admitted to  telemetry area for cardiac  monitoring.  A CT angiogram study has been ordered, results of which  have returned negative for an acute pulmonary embolism or a pulmonary  embolism.  An ultrasound study of both lower extremities has also been  ordered for the a.m. to evaluate further for a deep venous thrombosis.  The patient does not wish at this time to have Lovenox therapy, and at  this time does not wish to be on Coumadin therapy.  However, if her  results do reveal a pulmonary embolism or an acute cardiopulmonary  problem, this will be revisited with her.  Her regular medications have  been written.  Cardiac enzymes will continue to be performed.  Her  second set of cardiac enzymes have returned more elevated.  Creatinine  kinase 145, CK-MB 3.3, relative index 2.3 and troponin 0.32.  These  results have been discussed with the patient.  She is currently  asymptomatic.  Denies any chest pain or discomfort, and has not been  seen by a cardiologist in the past.  A cardiology consultation will be  placed with  who is on-call for unassigned patients.  A second  EKG has been performed, results of which reveal a normal sinus rhythm  without acute ST-segment changes.  The patient will be placed on topical  nitrate therapy and aspirin therapy at this time.  Beta blocker therapy  will also be started.      Della Goo, M.D.  Electronically Signed     HJ/MEDQ  D:  12/01/2007  T:  12/01/2007  Job:  [id number]   cc:   Ursula Beath, MD

## 2010-07-19 NOTE — Discharge Summary (Signed)
NAMEMarland Kitchen  Theresa Levine, Theresa Levine NO.:  [id number]   MEDICAL RECORD NO.:  [id number]         PATIENT TYPE:  CINP   LOCATION:                               FACILITY:  [facility]   PHYSICIAN:  Renee Ramus, MD       DATE OF BIRTH:  [birth year]   DATE OF ADMISSION:  DATE OF DISCHARGE:                               DISCHARGE SUMMARY   PRIMARY DIAGNOSIS:  Supraventricular tachycardia with rate-related  ischemia.   SECONDARY DIAGNOSES:  1. Hypertension.  2. Hypothyroid.  3. Anxiety.   HOSPITAL COURSE:  1. SVT with rate-related ischemia:  The patient is a 60 year old      female who was admitted secondary to evidence of rate-related      ischemia.  The patient did have an episode of SVT that was self      limited but no associated chest pain.  The patient did have some      chest pressure.  The patient did have an elevation in her troponin      but no elevation in CK or MB.  The patient's total CK was 99 to 145      on 4 sets of enzymes.  Her initial troponin was 0.04, increasing to      0.32, and decreasing to 0.07.  The patient did have a normal EKG.      She had a cardiac catheterization showing a small amount of plaque      in the right circ, 30% proximal and 20% mid but no stentable      lesions and not a large appreciable amount of coronary artery      stenosis.  The patient is now being discharged to home on aspirin      as prophylaxis with followup with Cardiology as an outpatient if      needed for an event monitor for her SVT.  2. Hypertension:  This is relatively well controlled on beta blocker,      and she will be continued on this postdischarge.  3. Hypothyroid:  The patient is currently stable on her thyroid      medication.  She had TSH of 2.3 with a free T4 of 1.2 upon      admission.  4. Anxiety:  The patient will be continued on her antianxiety      medication postdischarge.   Labs of note:  1. Mild increase in blood glucose at 118.  2. Elevated troponin as  previously dictated.  3. Normal thyroid as previously dictated.   STUDIES:  1. Heart catheterization results as previously dictated.  2. CT pulmonary angiogram showing no evidence of acute pulmonary      embolus or acute chest process.  3. Plain chest x-ray showing no acute cardiopulmonary abnormality.   MEDICATIONS ON DISCHARGE:  1. Aspirin 81 mg p.o. daily.  2. Levothyroxine 25 mcg p.o. daily.  3. Toprol-XL 100 mg p.o. daily.  4. Nexium 40 mg p.o. daily.  5. Xanax 0.25 mg one half tablet p.o. b.i.d. p.r.n. anxiety.   No labs or studies are pending at the time of  discharge.  The patient is  in stable condition and anxious for discharge.  Time spent 35 minutes.      Renee Ramus, MD  Electronically Signed     JF/MEDQ  D:  12/02/2007  T:  12/03/2007  Job:  [id number]

## 2010-07-19 NOTE — Consult Note (Signed)
[name], [name] NO.:  [id number]   MEDICAL RECORD NO.:  [id number]          PATIENT TYPE:  INP   LOCATION:  2022                         FACILITY:  [facility]   PHYSICIAN:  Glennie Isle, MD   DATE OF BIRTH:  [birth year]   DATE OF CONSULTATION:  12/01/2007  DATE OF DISCHARGE:                                 CONSULTATION   CHIEF COMPLAINT:  Positive troponins and palpitations.   HISTORY OF PRESENT ILLNESS:  The patient is a 60 year old female with a  history of breast cancer with radiation to the left side of her chest  back in 2008, positive family history of coronary artery disease,  tobacco use, hypothyroidism, currently presents with palpitations.  The  patient states that she had palpitations at 1 p.m. yesterday.  She  stated that the palpitations were also associated with substernal chest  pain radiating to the anterior neck and at 1 p.m. she called EMS.  Palpitations were sudden onset.  The chest pain was 5 out of 10. No  radiation though associated with shortness of breath.  No diaphoresis.  Chest pain resolved by the time EMS arrived.  The patient stated her  heart rate was in the 240s and she was given 6 mg of adenosine and the  SVT was resolved.  Unfortunately, no rhythm strips were obtained.  The  patient has been complaining of fatigue for the past month.  Two weeks  ago she had an episode of lightheadedness/dizziness after urinating and  vomiting.  Lightheadedness was going from sitting to standing.  Currently the patient is chest pain free.  She states never having any  symptoms like this before.   PAST MEDICAL HISTORY:  1. Left breast cancer, status post mastectomy in 1997.  The patient      had radiation to her left chest, 33 treatments, in 2008.  She also      had Adriamycin in 1998, status post four treatments.  2. GERD.  3. Anxiety.  4. Hypothyroidism.  5. History of tobacco use.   ALLERGIES:  ZOCOR causes myalgias.   MEDICATIONS:  1. Xanax 0.25 mg p.o. b.i.d.  2. Synthroid 25 mcg daily.  3. Nexium 40 mg daily.   SOCIAL HISTORY:  Lives alone.  She is divorced.  Positive tobacco one  pack a day x 40 years.  No alcohol or IV drug use.   FAMILY HISTORY:  Father had an MI in age 62.  A sister has a pacemaker  secondary to syncope.  A brother with esophageal cancer and is currently  deceased,.   REVIEW OF SYSTEMS:  A thorough 12-point review of systems was performed  and was negative except as noted per HPI.   CODE STATUS:  Full code.   PHYSICAL EXAMINATION:  VITAL SIGNS:  T-max 96.9, pulse 77, blood  pressure 144/94, oxygen saturation 99% on room air.  GENERAL APPEARANCE:  No apparent distress.  HEENT:  Normocephalic.  Pupils equal and round, reactive to light.  Extraocular movements intact.  NECK:  Supple.  No thyromegaly noted.  No JVD.  No lymphadenopathy.  CARDIOVASCULAR:  S1  and S2 are normal.  There is no systolic murmur.  Grade 1/6 at the apex.  LUNGS:  Clear to auscultation bilaterally.  No crackles or wheezes.  SKIN:  No rashes or lesions are noted.  ABDOMEN:  Soft, nontender.  No rebound or guarding.  Negative for  hepatosplenomegaly.  EXTREMITIES:  No clubbing, cyanosis or edema is noted.  MUSCULOSKELETAL: No joint deformities.  No CVA tenderness.  NEUROLOGIC:  Alert and oriented x3.  Cranial nerves II-XII grossly  intact.  Strength is 5/5 upper and lower extremities.  Normal sensation  throughout.   RADIOLOGY:  CT scan angiogram for PE protocol was performed and was  negative per PE.  EKG shows normal sinus rhythm, no ST changes; however,  low voltage.  The patient's heart rate was 92.   LABORATORY DATA:  Troponin was 0.04, then 0.032 which is positive.  CK-  MB was 1.9 to 3.3.   CBC:  Hemoglobin was 15.2, platelets 171, white count of 5.9.  BMP:  Potassium 3.5, BUN 6, creatinine 0.8.  D-dimer 1.18, TSH 2.3, T4 1.2.   ASSESSMENT/PLAN:  1. Palpitations, questionable supraventricular  tachycardia.  We have      no rhythm strips.  In the meantime we will continue beta blocker.      Goal heart rate of 60 to 70.  The patient may need an event      recorder as an outpatient.  In the meantime we will continue      telemetry and continue beta blocker, goal heart rate of 60 to 70.  2. Positive troponins.  The patient does have multiple risk factors.      This may be due to demand ischemia from supraventricular      tachycardia.  However, the patient will probably require cardiac      catheterization for further risk stratification. In the meantime I      would treat this as an acute coronary syndrome as the patient did      say that she had some chest pain and such that this is a positive      stress test with the tachycardia.  I would recommend continuing      aspirin 81 mg daily, titrating with Metoprolol to heart rate of 60      to 70.  We may initiate Lipitor 20 mg daily and start the patient      on heparin drip.  In the meantime I will hold ACE inhibitors as the      patient does state that her blood pressure typically runs in the      120s and as we titrate the beta blocker, we may consider adding ACE      inhibitor in case she is still hypertensive.  We will should check      an echocardiogram in the morning.  We recommend continuing to cycle      enzymes and monitor  the patient's telemetry.      Glennie Isle, MD  Electronically Signed     SS/MEDQ  D:  12/01/2007  T:  12/01/2007  Job:  [id number]

## 2010-07-25 ENCOUNTER — Telehealth: Payer: Self-pay | Admitting: Cardiology

## 2010-07-25 NOTE — Telephone Encounter (Signed)
Pt needs chlorthalione 25 mg once a day to be call in to [facility] [phone]

## 2010-07-26 MED ORDER — CHLORTHALIDONE 25 MG PO TABS: 25.0000 mg | ORAL_TABLET | Freq: Every day | ORAL | Status: DC

## 2010-08-09 ENCOUNTER — Encounter: Payer: Self-pay | Admitting: Cardiology

## 2010-09-08 ENCOUNTER — Ambulatory Visit (INDEPENDENT_AMBULATORY_CARE_PROVIDER_SITE_OTHER): Payer: BC Managed Care – PPO | Admitting: Cardiology

## 2010-09-08 ENCOUNTER — Encounter: Payer: Self-pay | Admitting: Cardiology

## 2010-09-08 DIAGNOSIS — I10 Essential (primary) hypertension: Secondary | ICD-10-CM

## 2010-09-08 DIAGNOSIS — E785 Hyperlipidemia, unspecified: Secondary | ICD-10-CM

## 2010-09-08 DIAGNOSIS — F17200 Nicotine dependence, unspecified, uncomplicated: Secondary | ICD-10-CM

## 2010-09-08 MED ORDER — CHLORTHALIDONE 25 MG PO TABS: 12.5000 mg | ORAL_TABLET | Freq: Every day | ORAL | Status: DC

## 2010-09-08 NOTE — Progress Notes (Signed)
PCP: Arlyce Dice  60 yo with history of SVT, hypertension, and hyperlipidemia presents for followup. Her LDL has been quite high. She had myalgias with Crestor, stomach burning with Lipitor, and both myalgias and stomach burning with pravastatin so was unable to take any of these statins. She is still smoking and has no desire to quit.  She has had no chest pain or significant exertional dyspnea (though she is not exerting herself much in general).  Rare palpitations.  She has lost 10 lbs over the last year and BP has been running lower, always < 140 and sometimes in the 100s systolic.  Main complaint is stress at work.  She recently had lipids done at [facility].   Labs (4/10): TG 129, HDL 44, LDL 188  Labs (6/10): TSH normal  Labs (1/11): LDL 175, HDL 42  Labs (2/11): K 4.1, creatinine 0.9   Allergies (verified):  No Known Drug Allergies   Past Medical History:  1. Supraventricular tachycardia. The patient had an episode of SVT in September 2009. Her heart rate was reported to be around 240 by EMS, However, there were no strips to evaluate. It did terminate with adenosine. 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 The patient did have an event monitor done in October 2009 that showed only normal sinus rhythm.  2. Left heart catheterization in September 2009. This showed only luminal irregularities. EF was 60%. Transthoracic echocardiogram also in September 2009 showed an EF of 55-60% with mild mitral regurgitation.  3. History of breast cancer. The patient did have a mastectomy in 1997. She had Adriamycin chemotherapy in 1998. She had radiation to her left chest in 2008.  4. Hypothyroidism.  5. Gastroesophageal reflux disease.  6. Anxiety.  7. Tobacco abuse.  8. HYPERTENSION (ICD-401.9)  9. PALPITATIONS (ICD-785.1)  10. Hyperlipidemia: Unable to tolerate Crestor, Lipitor, or pravastatin.  11. Depression.   Family History:  Reviewed history from 03/18/2009 and no changes required.   The patient's father had a myocardial infarction at the age of 23. She had a sister who had a pacemaker put in after an episode of syncope and she has a brother who died of esophageal cancer.   Social History:  Reviewed history from 08/31/2008 and no changes required.  The patient is a divorced Counsellor. She does smoke a pack a day, has done so far about 40 years. She drinks minimal alcohol.   Current Outpatient Prescriptions  Medication Sig Dispense Refill  . aspirin 81 MG tablet Take 81 mg by mouth daily.        . chlorthalidone (HYGROTON) 25 MG tablet Take 1 tablet (25 mg total) by mouth daily.  30 tablet  11  . cholecalciferol (VITAMIN D) 1000 UNITS tablet 1 tab a couple times weekly per pt      . esomeprazole (NEXIUM) 40 MG packet Take 40 mg by mouth daily.        Marland Kitchen levothyroxine (LEVOTHROID) 25 MCG tablet Take 25 mcg by mouth daily.        . metoprolol (TOPROL-XL) 100 MG 24 hr tablet Take 100 mg by mouth daily.        . potassium chloride SA (K-DUR,KLOR-CON) 20 MEQ tablet 2 tabs a week per pt      . DISCONTD: fish oil-omega-3 fatty acids 1000 MG capsule Take 1 capsule by mouth daily.          BP 100/80  Pulse 75  Resp 14  Ht 5\' 6"  (1.676 m)  Wt 195 lb (88.451 kg)  BMI 31.47 kg/m2 General: NAD, overweight.  Neck: No JVD, no thyromegaly or thyroid nodule.  Lungs: Clear to auscultation bilaterally with normal respiratory effort. CV: Nondisplaced PMI.  Heart regular S1/S2, no S3/S4, no murmur.  No peripheral edema.  No carotid bruit.  Normal pedal pulses.  Abdomen: Soft, nontender, no hepatosplenomegaly, no distention.  Neurologic: Alert and oriented x 3.  Psych: Normal affect. Extremities: No clubbing or cyanosis.

## 2010-09-08 NOTE — Assessment & Plan Note (Signed)
BP running lower with weight loss. Think it would be ok to lower chlorthalidone to 12.5 mg daily.  Will get a BMET in 2 wks on lower dose as PCP recently told her that her potassium level was low.

## 2010-09-08 NOTE — Patient Instructions (Signed)
Your physician has recommended you make the following change in your medication: decrease Chlorithiladone  Your physician recommends that you have a bmet drawn at your primary care doctor in 2 weeks.  Your physician recommends that you schedule a follow-up appointment in: 1 year with Dr. Shirlee Latch

## 2010-09-08 NOTE — Assessment & Plan Note (Signed)
LDL has been quite high in the past.  She cannot tolerate statins.  Will get recent lipids from PCP, would consider Niaspan if LDL remains very high.

## 2010-09-08 NOTE — Assessment & Plan Note (Signed)
I again strongly advised her to quit smoking.  She is not ready yet.

## 2010-10-14 ENCOUNTER — Other Ambulatory Visit: Payer: Self-pay | Admitting: *Deleted

## 2010-10-14 MED ORDER — METOPROLOL SUCCINATE ER 100 MG PO TB24: 100.0000 mg | ORAL_TABLET | Freq: Every day | ORAL | Status: DC

## 2010-11-14 ENCOUNTER — Telehealth: Payer: Self-pay | Admitting: Cardiology

## 2010-11-14 NOTE — Telephone Encounter (Signed)
LMTCB

## 2010-11-14 NOTE — Telephone Encounter (Signed)
Pt calling wanting to schedule appt to see MD. Pt c/o not feeling well, pt said maybe meds need to be adjusted. Pt says her BP and pulse are low and she stays tired all the time. Please return call to discuss further.

## 2010-11-14 NOTE — Telephone Encounter (Signed)
Returning call to [place]. Please call back.

## 2010-11-16 ENCOUNTER — Telehealth: Payer: Self-pay | Admitting: Cardiology

## 2010-11-16 ENCOUNTER — Ambulatory Visit (INDEPENDENT_AMBULATORY_CARE_PROVIDER_SITE_OTHER): Payer: BC Managed Care – PPO | Admitting: Cardiology

## 2010-11-16 VITALS — BP 121/84 | HR 78 | Ht 66.0 in | Wt 195.5 lb

## 2010-11-16 DIAGNOSIS — R42 Dizziness and giddiness: Secondary | ICD-10-CM

## 2010-11-16 MED ORDER — MECLIZINE HCL 25 MG PO TABS: ORAL_TABLET | ORAL | Status: DC

## 2010-11-16 NOTE — Telephone Encounter (Signed)
LMTCB

## 2010-11-16 NOTE — Patient Instructions (Signed)
Use Meclizine 25mg  every 8 hours as needed for vertigo/dizziness.  Lab today--BMP   Your physician wants you to follow-up in: July 2013. You will receive a reminder letter in the mail two months in advance. If you don't receive a letter, please call our office to schedule the follow-up appointment.

## 2010-11-16 NOTE — Telephone Encounter (Signed)
I talked with pt . Pt states when she woke up Sunday morning she had dizziness and feeling like the room was spinning. She went back to bed for a couple of hours and when she woke up she had the same feeling of dizziness. Pt states her BP was in the 90/60 range and her heart rate was 76. Pt states she has felt better the last couple of days. I reviewed with Dr Shirlee Latch. He recommended follow-up appt for pt with him in the office. Pt to see Dr Shirlee Latch today at 3:15pm.

## 2010-11-16 NOTE — Telephone Encounter (Signed)
I talked with pt. See phone note dated 11/16/10.

## 2010-11-16 NOTE — Telephone Encounter (Signed)
Pt called and requested appt to be seen for issues with blood pressure.  I offered 10-4.  She stated she had called several times and just played phone tag with nurse.  She would not accept the appt. And did not want a call put into the nurse.  She stated she could not believe that we were that busy.  Patient did not wish to be called back.

## 2010-11-17 ENCOUNTER — Encounter: Payer: Self-pay | Admitting: Cardiology

## 2010-11-17 DIAGNOSIS — R42 Dizziness and giddiness: Secondary | ICD-10-CM | POA: Insufficient documentation

## 2010-11-17 LAB — BASIC METABOLIC PANEL
BUN: 5 mg/dL — ABNORMAL LOW (ref 6–23)
CO2: 29 mEq/L (ref 19–32)
Chloride: 93 mEq/L — ABNORMAL LOW (ref 96–112)
Potassium: 3.2 mEq/L — ABNORMAL LOW (ref 3.5–5.1)

## 2010-11-17 NOTE — Progress Notes (Signed)
PCP: Arlyce Dice  60 yo with history of SVT, hypertension, and hyperlipidemia presents for acute visit.  On Sunday morning, she woke up at 7:30 am and noted that she was dizzy, with a spinning sensation when she stood up.  It was not really a lightheaded feeling.  She went back to bed and noted a similar spinning sensation when she got up later in the morning.  She went back to bed again, and when she got up the third time, the sensation was gone.  No further dizzy spells since Sunday.  No tachypalpitations.  She did not pass out.    Labs (4/10): TG 129, HDL 44, LDL 188  Labs (6/10): TSH normal  Labs (1/11): LDL 175, HDL 42  Labs (2/11): K 4.1, creatinine 0.9  Labs (5/12): LDL 146, HDL 45  Allergies (verified):  No Known Drug Allergies   Past Medical History:  1. Supraventricular tachycardia. The patient had an episode of SVT in September 2009. Her heart rate was reported to be around 240 by EMS, However, there were no strips to evaluate. It did terminate with adenosine. 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 The patient did have an event monitor done in October 2009 that showed only normal sinus rhythm.  2. Left heart catheterization in September 2009. This showed only luminal irregularities. EF was 60%. Transthoracic echocardiogram also in September 2009 showed an EF of 55-60% with mild mitral regurgitation.  3. History of breast cancer. The patient did have a mastectomy in 1997. She had Adriamycin chemotherapy in 1998. She had radiation to her left chest in 2008.  4. Hypothyroidism.  5. Gastroesophageal reflux disease.  6. Anxiety.  7. Tobacco abuse.  8. HYPERTENSION (ICD-401.9)  9. PALPITATIONS (ICD-785.1)  10. Hyperlipidemia: Unable to tolerate Crestor, Lipitor, or pravastatin.  11. Depression.   Family History:  The patient's father had a myocardial infarction at the age of 73. She had a sister who had a pacemaker put in after an episode of syncope and she has a brother who died of  esophageal cancer.   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 She drinks minimal alcohol.   Current Outpatient Prescriptions  Medication Sig Dispense Refill  . aspirin 81 MG tablet Take 81 mg by mouth daily.        . chlorthalidone (HYGROTON) 25 MG tablet Take 0.5 tablets (12.5 mg total) by mouth daily.  30 tablet  11  . cholecalciferol (VITAMIN D) 1000 UNITS tablet 1 tab a couple times weekly per pt      . esomeprazole (NEXIUM) 40 MG packet Take 40 mg by mouth daily.        Marland Kitchen levothyroxine (LEVOTHROID) 25 MCG tablet Take 25 mcg by mouth daily.        . metoprolol (TOPROL-XL) 100 MG 24 hr tablet Take 1 tablet (100 mg total) by mouth daily.  30 tablet  6  . potassium chloride SA (K-DUR,KLOR-CON) 20 MEQ tablet 2 tabs a week per pt.  Pt. Buys OTC and only takes half tablet      . meclizine (ANTIVERT) 25 MG tablet Take one every 8 hours as needed for dizziness/vertigo  30 tablet  0    BP 121/84  Pulse 78  Ht 5\' 6"  (1.676 m)  Wt 195 lb 8 oz (88.678 kg)  BMI 31.55 kg/m2 General: NAD, overweight.  Neck: No JVD, no thyromegaly or thyroid nodule.  Lungs: Clear to auscultation bilaterally with normal respiratory effort. CV: Nondisplaced PMI.  Heart regular S1/S2, no S3/S4, no murmur.  No peripheral edema.  No carotid bruit.  Normal pedal pulses.  Abdomen: Soft, nontender, no hepatosplenomegaly, no distention.  Neurologic: Alert and oriented x 3.  Psych: Normal affect. Extremities: No clubbing or cyanosis.

## 2010-11-17 NOTE — Assessment & Plan Note (Signed)
Patient's symptoms last Sunday are concerning for vertigo.  They have resolved since then.  ? BPPV versus labyrinthitis.  I will give her a prescription for meclizine.  If they return, she may need to see an ENT.

## 2010-12-05 LAB — CBC
Hemoglobin: 15.2 — ABNORMAL HIGH
MCHC: 34
MCHC: 34.2
MCV: 91.4
Platelets: 154
RBC: 4.38
RBC: 4.87
RDW: 13.3
RDW: 13.4
RDW: 13.5

## 2010-12-05 LAB — BASIC METABOLIC PANEL
BUN: 5 — ABNORMAL LOW
CO2: 23
Calcium: 8.9
Chloride: 109
GFR calc Af Amer: >60
GFR calc non Af Amer: >60
Glucose, Bld: 108 — ABNORMAL HIGH
Glucose, Bld: 118 — ABNORMAL HIGH
Sodium: 137

## 2010-12-05 LAB — CARDIAC PANEL(CRET KIN+CKTOT+MB+TROPI)
CK, MB: 1.9
CK, MB: 2.5
Relative Index: 1.8
Total CK: 126

## 2010-12-05 LAB — DIFFERENTIAL
Lymphocytes Relative: 29
Lymphs Abs: 1.7
Monocytes Absolute: 0.5
Monocytes Relative: 9
Neutro Abs: 3.5

## 2010-12-05 LAB — D-DIMER, QUANTITATIVE: D-Dimer, Quant: 1.18 — ABNORMAL HIGH

## 2010-12-05 LAB — B-NATRIURETIC PEPTIDE (CONVERTED LAB): Pro B Natriuretic peptide (BNP): <30

## 2010-12-05 LAB — PROTIME-INR: Prothrombin Time: 15.2

## 2010-12-05 LAB — TSH: TSH: 2.302

## 2010-12-05 LAB — CK TOTAL AND CKMB (NOT AT ARMC)
CK, MB: 3.3
Total CK: 145

## 2010-12-05 LAB — HEPARIN LEVEL (UNFRACTIONATED): Heparin Unfractionated: 0.63

## 2011-05-16 ENCOUNTER — Other Ambulatory Visit: Payer: Self-pay | Admitting: Cardiology

## 2011-12-15 ENCOUNTER — Other Ambulatory Visit: Payer: Self-pay | Admitting: Cardiology

## 2012-01-14 ENCOUNTER — Other Ambulatory Visit: Payer: Self-pay | Admitting: Cardiology

## 2012-01-30 ENCOUNTER — Ambulatory Visit (INDEPENDENT_AMBULATORY_CARE_PROVIDER_SITE_OTHER): Payer: BC Managed Care – PPO | Admitting: Physician Assistant

## 2012-01-30 ENCOUNTER — Encounter: Payer: Self-pay | Admitting: Physician Assistant

## 2012-01-30 VITALS — BP 124/80 | HR 74 | Ht 66.0 in | Wt 192.6 lb

## 2012-01-30 DIAGNOSIS — I2510 Atherosclerotic heart disease of native coronary artery without angina pectoris: Secondary | ICD-10-CM | POA: Insufficient documentation

## 2012-01-30 DIAGNOSIS — F17200 Nicotine dependence, unspecified, uncomplicated: Secondary | ICD-10-CM

## 2012-01-30 DIAGNOSIS — I10 Essential (primary) hypertension: Secondary | ICD-10-CM

## 2012-01-30 DIAGNOSIS — I498 Other specified cardiac arrhythmias: Secondary | ICD-10-CM

## 2012-01-30 MED ORDER — LISINOPRIL 10 MG PO TABS
10.0000 mg | ORAL_TABLET | Freq: Every day | ORAL | Status: DC
Start: 2012-01-30 — End: 2012-04-01

## 2012-01-30 NOTE — Patient Instructions (Addendum)
Your physician recommends that you return for lab work in: 02/09/12 FOR BMET THIS WILL BE 10 DAYS AFTER STARTING THE LISINOPRIL  STOP NORVASC  CHECK BLOOD PRESSURE ONCE DAILY AND BRING RECORDINGS WITH YOU WHEN YOU COME IN FOR LAB 02/09/12 AND PLEASE GIVE TO SCOTT WEAVER,PAC OR CAROL FIATO, CMA  Your physician wants you to follow-up in: 6 MONTHS WITH DR. Shirlee Latch. You will receive a letter in the mail two months in advance. If you don't receive a letter, please call our office to schedule the follow-up appointment.

## 2012-01-30 NOTE — Progress Notes (Signed)
[address], [address] [place], [place]  [address] Phone: [phone], Fax:  [phone]  Date:  01/30/2012   Name:  Theresa Levine   DOB:  1951/01/23   MRN:  [id number]  PCP:  Mady Gemma, PA  Primary Cardiologist:  Dr. Marca Ancona  Primary Electrophysiologist:  None    History of Present Illness: Theresa Levine is a 61 y.o. female who returns for follow up on BP.  She has a hx of SVT, HTN, HL, vertigo.  She had a Type II NSTEMI in the setting of SVT in 2009.  LHC demonstrated minimal non-obstructive CAD.  Her LDL has been quite high. She had myalgias with Crestor, stomach burning with Lipitor, and both myalgias and stomach burning with pravastatin so was unable to take any of these statins.   She was recently taken off of chlorthalidone by her PCP secondary to hyponatremia. She has been placed on amlodipine. She does not like the way that this has made her feel. She feels swollen. She also feels congestion and chest tightness. Prior to starting amlodipine, she denies having any chest pain or shortness of breath. She did feel fatigued with her hyponatremia. This is resolved. She denies orthopnea, PND. She has minimal pedal edema. She denies syncope.  Labs (4/10):    TG 129, HDL 44, LDL 188  Labs (6/10):    TSH normal  Labs (1/11):    LDL 175, HDL 42  Labs (2/11):    K 4.1, creatinine 0.9  Labs (5/12):    LDL 146, HDL 45  Labs (9/12):    K 3.2, creatinine 0.8  Wt Readings from Last 3 Encounters:  01/30/12 192 lb 9.6 oz (87.363 kg)  11/16/10 195 lb 8 oz (88.678 kg)  09/08/10 195 lb (88.451 kg)    Past Medical History:  1. Supraventricular tachycardia. The patient had an episode of SVT in September 2009. Her heart rate was reported to be around 240 by EMS, However, there were no strips to evaluate. It did terminate with adenosine. 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 The patient did have an event monitor done in October 2009 that showed only normal sinus rhythm.    2. Left heart catheterization in September 2009. This showed only luminal irregularities. EF was 60%. Transthoracic echocardiogram also in September 2009 showed an EF of 55-60% with mild mitral regurgitation.  3. History of breast cancer. The patient did have a mastectomy in 1997. She had Adriamycin chemotherapy in 1998. She had radiation to her left chest in 2008.  4. Hypothyroidism.  5. Gastroesophageal reflux disease.  6. Anxiety.  7. Tobacco abuse.  8. HYPERTENSION (ICD-401.9)  9. PALPITATIONS (ICD-785.1)  10. Hyperlipidemia: Unable to tolerate Crestor, Lipitor, or pravastatin.  11. Depression.    Current Outpatient Prescriptions  Medication Sig Dispense Refill  . amLODipine (NORVASC) 5 MG tablet       . aspirin 81 MG tablet Take 81 mg by mouth daily.        . cholecalciferol (VITAMIN D) 1000 UNITS tablet 1 tab a couple times weekly per pt      . esomeprazole (NEXIUM) 40 MG packet Take 40 mg by mouth daily.        Marland Kitchen levothyroxine (LEVOTHROID) 25 MCG tablet Take 25 mcg by mouth daily.        . meclizine (ANTIVERT) 25 MG tablet Take one every 8 hours as needed for dizziness/vertigo  30 tablet  0  . metoprolol succinate (TOPROL-XL) 100 MG 24 hr  tablet TAKE 1 TABLET BY MOUTH DAILY  30 tablet  0  . potassium chloride SA (K-DUR,KLOR-CON) 20 MEQ tablet 2 tabs a week per pt.  Pt. Buys OTC and only takes half tablet      . chlorthalidone (HYGROTON) 25 MG tablet Take 0.5 tablets (12.5 mg total) by mouth daily.  30 tablet  11    Allergies: Allergies  Allergen Reactions  . Sulfa Antibiotics     Social History:  The patient  reports that she has been smoking Cigarettes.  She has a 40 pack-year smoking history. She does not have any smokeless tobacco history on file. She reports that she drinks alcohol.   ROS:  Please see the history of present illness.   All other systems reviewed and negative.   PHYSICAL EXAM: VS:  BP 124/80  Pulse 74  Ht 5\' 6"  (1.676 m)  Wt 192 lb 9.6 oz (87.363  kg)  BMI 31.09 kg/m2 Well nourished, well developed, in no acute distress HEENT: normal Neck: no JVD Vascular: No carotid bruits Endocrine: No thyromegaly Cardiac:  normal S1, S2; RRR; no murmur Lungs:  clear to auscultation bilaterally, no wheezing, rhonchi or rales Abd: soft, nontender, no hepatomegaly Ext: no edema Skin: warm and dry Neuro:  CNs 2-12 intact, no focal abnormalities noted  EKG:  NSR, HR 74, nonspecific ST-T wave changes      ASSESSMENT AND PLAN:  1. Hypertension:   Blood pressure is controlled. However, she is having side effects to amlodipine. We had a long discussion regarding changing to a different medication. I would try to avoid diuretics given that she has a hx of hyponatremia.  I have recommended stopping amlodipine and starting lisinopril 10 mg daily. I have asked her to refrain from taking chlorthalidone as well as her potassium. She may use chlorthalidone on a when necessary basis for LE edema only. She will keep an eye on her blood pressures over the next one to 2 weeks. She will come in for a repeat basic metabolic panel in one week. If her blood pressures are stable, she can continue her current dose. We will make adjustments as necessary.  2. Supraventricular Tachycardia:   Well controlled on Toprol. Continue current therapy.  3. Coronary Artery Disease:   She had minimal non-obstructive coronary artery disease at catheterization in 2009. She has been intolerant to statins. Continue aspirin.  4. Tobacco Abuse:   She is not interested in quitting smoking.  5. Disposition:   Followup with Dr. Shirlee Latch in 6 months.  Luna Glasgow, PA-C  8:33 AM 01/30/2012

## 2012-02-09 ENCOUNTER — Ambulatory Visit (INDEPENDENT_AMBULATORY_CARE_PROVIDER_SITE_OTHER): Payer: BC Managed Care – PPO | Admitting: *Deleted

## 2012-02-09 DIAGNOSIS — I10 Essential (primary) hypertension: Secondary | ICD-10-CM

## 2012-02-09 DIAGNOSIS — R0989 Other specified symptoms and signs involving the circulatory and respiratory systems: Secondary | ICD-10-CM

## 2012-02-10 LAB — BASIC METABOLIC PANEL
BUN: 5 mg/dL — ABNORMAL LOW (ref 6–23)
Potassium: 3.8 mEq/L (ref 3.5–5.3)
Sodium: 136 mEq/L (ref 135–145)

## 2012-02-12 ENCOUNTER — Telehealth: Payer: Self-pay | Admitting: Cardiology

## 2012-02-12 NOTE — Telephone Encounter (Signed)
F/u   Returning call back to lynn

## 2012-02-12 NOTE — Telephone Encounter (Signed)
Spoke with pt about recent lab results 

## 2012-02-14 ENCOUNTER — Other Ambulatory Visit: Payer: Self-pay | Admitting: Cardiovascular Disease

## 2012-03-16 ENCOUNTER — Other Ambulatory Visit: Payer: Self-pay | Admitting: Cardiovascular Disease

## 2012-03-28 ENCOUNTER — Telehealth: Payer: Self-pay | Admitting: Cardiology

## 2012-03-28 DIAGNOSIS — I10 Essential (primary) hypertension: Secondary | ICD-10-CM

## 2012-03-28 NOTE — Telephone Encounter (Signed)
Pt requesting a call from Dr Shirlee Latch re medication scott put her on back in November, doesn't want the nurse to call , aware Dr Shirlee Latch out until Monday

## 2012-04-01 MED ORDER — LOSARTAN POTASSIUM 25 MG PO TABS: 25.0000 mg | ORAL_TABLET | Freq: Every day | ORAL | Status: DC

## 2012-04-01 NOTE — Telephone Encounter (Signed)
Pt advised,verbalized understanding. 

## 2012-04-01 NOTE — Telephone Encounter (Signed)
Please have Theresa Levine stop lisinopril and start losartan 25 mg daily.  See me in the office in 1 month with a BMET at that time.

## 2012-05-02 ENCOUNTER — Encounter: Payer: Self-pay | Admitting: Cardiology

## 2012-05-02 ENCOUNTER — Ambulatory Visit (INDEPENDENT_AMBULATORY_CARE_PROVIDER_SITE_OTHER): Payer: BC Managed Care – PPO | Admitting: Cardiology

## 2012-05-02 ENCOUNTER — Telehealth: Payer: Self-pay | Admitting: Cardiology

## 2012-05-02 VITALS — BP 128/76 | HR 68 | Resp 16 | Ht 66.0 in | Wt 189.4 lb

## 2012-05-02 DIAGNOSIS — I498 Other specified cardiac arrhythmias: Secondary | ICD-10-CM

## 2012-05-02 DIAGNOSIS — F17200 Nicotine dependence, unspecified, uncomplicated: Secondary | ICD-10-CM

## 2012-05-02 DIAGNOSIS — E785 Hyperlipidemia, unspecified: Secondary | ICD-10-CM

## 2012-05-02 DIAGNOSIS — I10 Essential (primary) hypertension: Secondary | ICD-10-CM

## 2012-05-02 LAB — BASIC METABOLIC PANEL
CO2: 25 mEq/L (ref 19–32)
GFR: 67.86 mL/min (ref >60.00)
Glucose, Bld: 101 mg/dL — ABNORMAL HIGH (ref 70–99)
Potassium: 4.1 mEq/L (ref 3.5–5.1)
Sodium: 135 mEq/L (ref 135–145)

## 2012-05-02 LAB — LIPID PANEL
HDL: 35.2 mg/dL — ABNORMAL LOW (ref >39.00)
Triglycerides: 167 mg/dL — ABNORMAL HIGH (ref 0.0–149.0)
VLDL: 33.4 mg/dL (ref 0.0–40.0)

## 2012-05-02 MED ORDER — BUPROPION HCL ER (SR) 150 MG PO TB12: ORAL_TABLET | ORAL | Status: DC

## 2012-05-02 NOTE — Telephone Encounter (Signed)
Pt called because she said Dr. Shirlee Latch and the order for Niacin is not in the [facility] when she went to peak it up. Pt was made aware that on today's office note  Dr. Shirlee Latch writes " Pt does not tolerates multiple statins. will see what her numbers looks like"  Pt had   lab today. Md will review labs  then MD will decide if pt needs to be on this medication. Pt verbalized understanding.

## 2012-05-02 NOTE — Telephone Encounter (Signed)
Pt was to get a new rx for niacin called into [facility], one rx was there but not niacin

## 2012-05-02 NOTE — Progress Notes (Signed)
Patient ID: Theresa Levine, female   DOB: April 12, 1950, 62 y.o.   MRN: [id number] PCP: Arlyce Dice  62 yo with history of SVT, hypertension, and hyperlipidemia presents for cardiology followup.  She has had trouble recently with her BP meds.  Chlorthalidone apparently was stopped due to hyponatremia.  She was started on lisinopril but it made her feel "bad."  She then started amlodipine but this made her ankles swell.  Finally, I put her on losartan which she has tolerated.  BP is good today and has been ok at home when she checks.  She is under a lot of stress.  Chronically ill sister is now living with her.  Still working full-time.  No tachypalpitations.  Mild dyspnea with stairs.  No shortness of breath on flat ground and no exertional chest pain.  She continues to smoke.   Labs (4/10): TG 129, HDL 44, LDL 188  Labs (6/10): TSH normal  Labs (1/11): LDL 175, HDL 42  Labs (2/11): K 4.1, creatinine 0.9  Labs (5/12): LDL 146, HDL 45 Labs (12/13): K 3.8, creatinine 0.8  Allergies (verified):  No Known Drug Allergies   Past Medical History:  1. Supraventricular tachycardia. The patient had an episode of SVT in September 2009. Her heart rate was reported to be around 240 by EMS, However, there were no strips to evaluate. It did terminate with adenosine. 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 The patient did have an event monitor done in October 2009 that showed only normal sinus rhythm.  2. Left heart catheterization in September 2009. This showed only luminal irregularities. EF was 60%. Transthoracic echocardiogram also in September 2009 showed an EF of 55-60% with mild mitral regurgitation.  3. History of breast cancer. The patient did have a mastectomy in 1997. She had Adriamycin chemotherapy in 1998. She had radiation to her left chest in 2008.  4. Hypothyroidism.  5. Gastroesophageal reflux disease.  6. Anxiety.  7. Tobacco abuse.  8. HYPERTENSION (ICD-401.9): has not tolerated lisinopril or  amlodipine.  Hyponatremia on chlorthalidone.  9. PALPITATIONS (ICD-785.1)  10. Hyperlipidemia: Unable to tolerate Crestor, Lipitor, or pravastatin.  11. Depression.   Family History:  The patient's father had a myocardial infarction at the age of 68. She had a sister who had a pacemaker put in after an episode of syncope and she has a brother who died of esophageal cancer.   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 She drinks minimal alcohol.   ROS: All systems reviewed and negative except as per HPI.   Current Outpatient Prescriptions  Medication Sig Dispense Refill  . aspirin 81 MG tablet Take 81 mg by mouth daily.        Marland Kitchen levothyroxine (LEVOTHROID) 25 MCG tablet Take 25 mcg by mouth daily.        Marland Kitchen losartan (COZAAR) 25 MG tablet Take 1 tablet (25 mg total) by mouth daily.  30 tablet  3  . meclizine (ANTIVERT) 25 MG tablet Take one every 8 hours as needed for dizziness/vertigo  30 tablet  0  . metoprolol succinate (TOPROL-XL) 100 MG 24 hr tablet TAKE 1 TABLET BY MOUTH EVERY DAY  30 tablet  3  . buPROPion (WELLBUTRIN SR) 150 MG 12 hr tablet Take one tablet by mouth once daily for 3 days and then increase to 1 tablet by mouth twice daily  60 tablet  6  . chlorthalidone (  HYGROTON) 25 MG tablet Take 0.5 tablets (12.5 mg total) by mouth daily.  30 tablet  11  . cholecalciferol (VITAMIN D) 1000 UNITS tablet 1 tab a couple times weekly per pt      . esomeprazole (NEXIUM) 40 MG packet Take 40 mg by mouth daily.        . potassium chloride SA (K-DUR,KLOR-CON) 20 MEQ tablet 2 tabs a week per pt.  Pt. Buys OTC and only takes half tablet       No current facility-administered medications for this visit.    BP 128/76  Pulse 68  Resp 16  Ht 5\' 6"  (1.676 m)  Wt 189 lb 6.4 oz (85.911 kg)  BMI 30.58 kg/m2 General: NAD, overweight.  Neck: No JVD, no thyromegaly or thyroid nodule.  Lungs: Clear to auscultation  bilaterally with normal respiratory effort. CV: Nondisplaced PMI.  Heart regular S1/S2, no S3/S4, no murmur.  No peripheral edema.  No carotid bruit.  Normal pedal pulses.  Abdomen: Soft, nontender, no hepatosplenomegaly, no distention.  Neurologic: Alert and oriented x 3.  Psych: Normal affect. Extremities: No clubbing or cyanosis.   Assessment/Plan: 1. HTN: She seems to be tolerating losartan and BP is controlled.  Continue this medication.  Will get BMET today.  2. SVT: No tachypalpitations.  Continue Toprol XL.  3. Hyperlipidemia: Will get lipids today.  LDL has been high in the past.  She has not tolerated multiple statins.  Will see what her numbers look like but she would be willing to try Niaspan.  4. Smoking: She continues to smoke but is thinking about quitting now.  She will try Wellbutrin for smoking cessation.   Marca Ancona 05/02/2012 9:18 AM

## 2012-05-02 NOTE — Patient Instructions (Addendum)
Your physician has recommended you make the following change in your medication: START Wellbutrin 150 mg once daily for 3 days and then increase to one tablet twice daily.   Otherwise, your physician recommends that you continue on your current medications as directed. Please refer to the Current Medication list given to you today.   Your physician recommends that you return for lab work in: today (bmet, lipid)  Your physician wants you to follow-up in: 1 year.   You will receive a reminder letter in the mail two months in advance. If you don't receive a letter, please call our office to schedule the follow-up appointment.

## 2012-05-06 ENCOUNTER — Other Ambulatory Visit: Payer: Self-pay | Admitting: *Deleted

## 2012-05-06 DIAGNOSIS — E785 Hyperlipidemia, unspecified: Secondary | ICD-10-CM

## 2012-05-06 MED ORDER — NIACIN ER (ANTIHYPERLIPIDEMIC) 500 MG PO TBCR: 500.0000 mg | EXTENDED_RELEASE_TABLET | Freq: Every day | ORAL | Status: DC

## 2012-07-09 ENCOUNTER — Ambulatory Visit (INDEPENDENT_AMBULATORY_CARE_PROVIDER_SITE_OTHER): Payer: BC Managed Care – PPO

## 2012-07-09 DIAGNOSIS — I10 Essential (primary) hypertension: Secondary | ICD-10-CM

## 2012-07-09 DIAGNOSIS — E785 Hyperlipidemia, unspecified: Secondary | ICD-10-CM

## 2012-07-09 LAB — HEPATIC FUNCTION PANEL
ALT: 16 U/L (ref 0–35)
AST: 19 U/L (ref 0–37)
Alkaline Phosphatase: 66 U/L (ref 39–117)
Bilirubin, Direct: 0.1 mg/dL (ref 0.0–0.3)
Total Protein: 7 g/dL (ref 6.0–8.3)

## 2012-07-09 LAB — BASIC METABOLIC PANEL
Calcium: 9.2 mg/dL (ref 8.4–10.5)
Creatinine, Ser: 0.8 mg/dL (ref 0.4–1.2)
GFR: 74.54 mL/min (ref >60.00)
Glucose, Bld: 96 mg/dL (ref 70–99)
Sodium: 135 mEq/L (ref 135–145)

## 2012-07-09 LAB — LIPID PANEL: HDL: 39.1 mg/dL (ref >39.00)

## 2012-07-12 ENCOUNTER — Other Ambulatory Visit: Payer: Self-pay | Admitting: Cardiovascular Disease

## 2012-07-26 ENCOUNTER — Other Ambulatory Visit: Payer: Self-pay | Admitting: Cardiology

## 2012-09-23 ENCOUNTER — Other Ambulatory Visit: Payer: Self-pay | Admitting: Cardiology

## 2012-09-24 ENCOUNTER — Other Ambulatory Visit: Payer: Self-pay | Admitting: Cardiovascular Disease

## 2012-12-06 ENCOUNTER — Telehealth: Payer: Self-pay | Admitting: Cardiology

## 2012-12-06 NOTE — Telephone Encounter (Signed)
LMTCB 12:42pm 12/06/12 PE

## 2012-12-06 NOTE — Telephone Encounter (Addendum)
Pt having problem with ankles swelling since yesterday, right one worse, pls advise, pt has gained 3lbs since then, pls call [phone]

## 2013-01-21 ENCOUNTER — Other Ambulatory Visit: Payer: Self-pay | Admitting: Cardiology

## 2013-03-13 ENCOUNTER — Other Ambulatory Visit: Payer: Self-pay | Admitting: Cardiology

## 2013-03-13 ENCOUNTER — Other Ambulatory Visit: Payer: Self-pay

## 2013-03-13 MED ORDER — METOPROLOL SUCCINATE ER 100 MG PO TB24: ORAL_TABLET | ORAL | Status: DC

## 2013-07-14 ENCOUNTER — Other Ambulatory Visit: Payer: Self-pay | Admitting: Cardiology

## 2013-07-23 NOTE — Telephone Encounter (Signed)
We cannot refill meds if she has never been seen here.

## 2013-07-25 ENCOUNTER — Other Ambulatory Visit: Payer: Self-pay | Admitting: Cardiology

## 2013-07-25 NOTE — Telephone Encounter (Signed)
Can this be refilled? I don't see where the patient has ever been seen here. Please advise. Thanks, MI

## 2013-07-25 NOTE — Telephone Encounter (Signed)
If the patient has never been seen here we cannot refill her medication.

## 2013-07-29 NOTE — Telephone Encounter (Signed)
It looks like her name has been changed, we may have her as Catherine Harrison. She note from pharmacy.

## 2013-08-03 NOTE — Telephone Encounter (Signed)
OK to approve. 

## 2013-09-08 ENCOUNTER — Other Ambulatory Visit: Payer: Self-pay | Admitting: Family Medicine

## 2013-09-08 DIAGNOSIS — E049 Nontoxic goiter, unspecified: Secondary | ICD-10-CM

## 2013-09-09 ENCOUNTER — Other Ambulatory Visit: Payer: Self-pay

## 2013-09-10 ENCOUNTER — Other Ambulatory Visit: Payer: Self-pay | Admitting: Family Medicine

## 2013-09-10 ENCOUNTER — Ambulatory Visit
Admission: RE | Admit: 2013-09-10 | Discharge: 2013-09-10 | Disposition: A | Discharge status: Home / Self Care | Payer: BC Managed Care – PPO | Source: Ambulatory Visit | Attending: Family Medicine | Admitting: Family Medicine

## 2013-09-10 ENCOUNTER — Encounter (INDEPENDENT_AMBULATORY_CARE_PROVIDER_SITE_OTHER): Payer: Self-pay

## 2013-09-10 ENCOUNTER — Other Ambulatory Visit: Payer: Self-pay

## 2013-09-10 DIAGNOSIS — E010 Iodine-deficiency related diffuse (endemic) goiter: Secondary | ICD-10-CM

## 2013-10-09 ENCOUNTER — Ambulatory Visit (INDEPENDENT_AMBULATORY_CARE_PROVIDER_SITE_OTHER): Payer: BC Managed Care – PPO | Admitting: Physician Assistant

## 2013-10-09 ENCOUNTER — Telehealth: Payer: Self-pay | Admitting: Cardiology

## 2013-10-09 ENCOUNTER — Encounter: Payer: Self-pay | Admitting: Physician Assistant

## 2013-10-09 VITALS — BP 130/82 | HR 70 | Ht 66.0 in | Wt 189.0 lb

## 2013-10-09 DIAGNOSIS — F411 Generalized anxiety disorder: Secondary | ICD-10-CM

## 2013-10-09 DIAGNOSIS — I498 Other specified cardiac arrhythmias: Secondary | ICD-10-CM

## 2013-10-09 DIAGNOSIS — F419 Anxiety disorder, unspecified: Secondary | ICD-10-CM

## 2013-10-09 DIAGNOSIS — R079 Chest pain, unspecified: Secondary | ICD-10-CM

## 2013-10-09 DIAGNOSIS — I10 Essential (primary) hypertension: Secondary | ICD-10-CM

## 2013-10-09 DIAGNOSIS — R002 Palpitations: Secondary | ICD-10-CM

## 2013-10-09 DIAGNOSIS — F17200 Nicotine dependence, unspecified, uncomplicated: Secondary | ICD-10-CM

## 2013-10-09 NOTE — Patient Instructions (Signed)
Your physician recommends that you schedule a follow-up appointment in: 3 MONTHS WITH DR. Aundra Dubin  YOU CAN TRY INCREASING NEXIUM TO TWICE A DAY FOR 4-5 DAYS THEN GO BACK TO ONCE DAY TO SEE IF THIS HELPS YOUR SYMPTOMS  IF YOUR SYMPTOMS CONTINUE OR GET WORSE THEN CALL [phone] TO SCHEDULE FOR A STRESS TEST AND TO WEAR AN EVENT HEART MONITOR

## 2013-10-09 NOTE — Progress Notes (Signed)
Cardiology Office Note    Date:  10/09/2013   ID:  Theresa Levine, DOB [birth year], MRN [id number]  PCP:  Tula Nakayama  Cardiologist:  Dr. Loralie Champagne      History of Present Illness: Theresa Levine is a 63 y.o. female with a history of SVT, HTN, HL.  Patient called in today with complaints of chest discomfort and dizziness. She was added on to my schedule for further evaluation.  Patient had 2 episodes this week of a strange feeling in her chest that lasted about 60 seconds or less.  She does note increased stress over the last 2 years. 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 She denies any radiating symptoms or associated shortness of breath. She may have been nauseated. She denies diaphoresis. She denies syncope or near syncope. She denies a recent history of exertional chest pain or shortness breath. She denies orthopnea, PND or edema.  Studies:  - LHC (9/09):  LAD and circumflex with irregularities and ectasia, proximal and mid RCA 30%, EF 60%.  - Echo (9/09):  EF 55-65%, mild MR   Recent Labs/Images: No results found for requested labs within last 365 days.     Wt Readings from Last 3 Encounters:  10/09/13 189 lb (85.73 kg)  05/02/12 189 lb 6.4 oz (85.911 kg)  01/30/12 192 lb 9.6 oz (87.363 kg)     Past Medical History: 1. Supraventricular tachycardia. The patient had an episode of SVT in September 2009. Her heart rate was reported to be around 240 by EMS, However, there were no strips to evaluate. It did terminate with adenosine. 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 The patient did have an event monitor done in October 2009 that showed only normal sinus rhythm.  2. Left heart catheterization in September 2009. This showed only luminal irregularities. EF was 60%. Transthoracic echocardiogram also in September 2009 showed an EF of 55-60% with mild mitral regurgitation.  3. History of breast cancer. The patient did have a  mastectomy in 1997. She had Adriamycin chemotherapy in 1998. She had radiation to her left chest in 2008.  4. Hypothyroidism.  5. Gastroesophageal reflux disease.  6. Anxiety.  7. Tobacco abuse.  8. HYPERTENSION (ICD-401.9): has not tolerated lisinopril or amlodipine. Hyponatremia on chlorthalidone.  9. PALPITATIONS (ICD-785.1)  10. Hyperlipidemia: Unable to tolerate Crestor, Lipitor, or pravastatin.  11. Depression.   Past Medical History  Diagnosis Date  . Supraventricular tachycardia     The patient had an episode of SVT in September 2009. Her heart rate was reported  to be around 240 by EMS., However, there were no strips to evaluate. It did terminate with  adenosine. 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 The patient did have an event monitor done in October 2009 that showed only normal sinus rhythm.   . History of percutaneous left heart catheterization September 2009    This showed only luminal irregularities, EF was 60%   . Mild mitral regurgitation by prior echocardiogram September 2009    Showed an EF of 55-60%  . FHx: chemotherapy 1998    Radiation left in chest in 2008  . Breast cancer   . Hypothyroidism   . Gastroesophageal reflux disease   . Anxiety   . Hypertension   . Tobacco user   . Palpitations   . Hyperlipidemia     unable to tolerate Crestor, Lipitor, or pravastatin  . Depression     Current  Outpatient Prescriptions  Medication Sig Dispense Refill  . aspirin 81 MG tablet Take 81 mg by mouth daily.        Marland Kitchen buPROPion (WELLBUTRIN SR) 150 MG 12 hr tablet Take one tablet by mouth once daily for 3 days and then increase to 1 tablet by mouth twice daily  60 tablet  6  . chlorthalidone (HYGROTON) 25 MG tablet Take 0.5 tablets (12.5 mg total) by mouth daily.  30 tablet  11  . cholecalciferol (VITAMIN D) 1000 UNITS tablet 1 tab a couple times weekly per pt      . esomeprazole (NEXIUM) 40 MG packet Take 40 mg by mouth daily.        Marland Kitchen levothyroxine (LEVOTHROID) 25 MCG  tablet Take 25 mcg by mouth daily.        Marland Kitchen losartan (COZAAR) 25 MG tablet TAKE 1 TABLET BY MOUTH DAILY  30 tablet  11  . meclizine (ANTIVERT) 25 MG tablet Take one every 8 hours as needed for dizziness/vertigo  30 tablet  0  . metoprolol succinate (TOPROL-XL) 100 MG 24 hr tablet TAKE 1 TABLET BY MOUTH EVERY DAY  30 tablet  3  . niacin (NIASPAN) 500 MG CR tablet TAKE 1 TABLET BY MOUTH AT BEDTIME  30 tablet  3  . potassium chloride SA (K-DUR,KLOR-CON) 20 MEQ tablet 2 tabs a week per pt.  Pt. Buys OTC and only takes half tablet       No current facility-administered medications for this visit.     Allergies:   Sulfa antibiotics   Social History:  The patient  reports that she has been smoking Cigarettes.  She has a 40 pack-year smoking history. She does not have any smokeless tobacco history on file. She reports that she drinks alcohol.   Family History:  The patient's family history includes Esophageal cancer in her brother; Fainting in her sister; Heart attack (age of onset: 16) in her father.   ROS:  Please see the history of present illness.      All other systems reviewed and negative.   PHYSICAL EXAM: VS:  BP 130/82  Pulse 70  Ht 5\' 6"  (1.676 m)  Wt 189 lb (85.73 kg)  BMI 30.52 kg/m2 Well nourished, well developed, in no acute distress HEENT: normal Neck: no JVD Cardiac:  normal S1, S2; RRR; no murmur Lungs:  clear to auscultation bilaterally, no wheezing, rhonchi or rales Abd: soft, nontender, no hepatomegaly Ext: no edema Skin: warm and dry Neuro:  CNs 2-12 intact, no focal abnormalities noted  EKG:  NSR, HR 70, normal axis, no ST changes     ASSESSMENT AND PLAN:  Chest pain, unspecified chest pain type:  Symptoms are very atypical. They were quite brief. They seem to be related to emotional stress. I offered to arrange an exercise treadmill test for risk stratification. She prefers to hold off for now. If she has recurrent symptoms, she will call to arrange an  ETT.  Unspecified essential hypertension:  controlled.  SUPRAVENTRICULAR TACHYCARDIA:  She has not had any clear symptoms of rapid palpitations. Question if the symptoms she had today and 2 days ago may have been related to recurrent SVT. If she has more symptoms, consider event monitor.  TOBACCO ABUSE:  I have recommended cessation.  Anxiety:  I have recommended that she follow up with her primary care provider to discuss further evaluation and management.    Disposition:  Follow up with Dr. Aundra Dubin in 3 months.   Signed,  Versie Starks, MHS 10/09/2013 3:42 PM    [facility] [address], [place], [place]  [address] Phone: [phone]; Fax: [phone]

## 2013-10-09 NOTE — Telephone Encounter (Signed)
New problem   Pt need to speak to nurse concerning mild chest pain and dizziness. Pt isn't having any chest pain at present.

## 2013-10-09 NOTE — Telephone Encounter (Signed)
Pt states on Tuesday while at work at her desk she had chest tightness/aching in her chest associated with a feeling of coming apart inside. The chest tightness lasted a few minutes. Pt states she went home for the rest of the day.   Shortly after arriving at work this morning she had some dizziness associated with chest tightness. This lasted a couple of minutes. Pt denies any other symptoms, including lightheadedness/dizziness or palpitations.   Pt feels her symptoms are anxiety related but is requesting an appt for an EKG.  Pt is asymptomatic at this time.   I reviewed with Dr Angelena Form and he agreed OK for pt to come to office today to see PA and have EKG.   I have given pt appt at 3pm today with [facility].  Pt advised not to drive to appt.  Pt advised if she has any more symptoms at all, she seek medical care at [facility].   Pt verbalized understanding.

## 2013-11-14 ENCOUNTER — Other Ambulatory Visit: Payer: Self-pay | Admitting: Cardiology

## 2013-11-26 ENCOUNTER — Other Ambulatory Visit: Payer: Self-pay | Admitting: Cardiology

## 2013-11-27 ENCOUNTER — Other Ambulatory Visit: Payer: Self-pay

## 2013-12-18 ENCOUNTER — Other Ambulatory Visit: Payer: Self-pay | Admitting: Cardiology

## 2013-12-19 NOTE — Telephone Encounter (Signed)
I cannot find a record that Dr Shirlee LatchMcLean has seen this patient.

## 2014-01-14 ENCOUNTER — Other Ambulatory Visit: Payer: Self-pay | Admitting: Cardiology

## 2014-01-20 NOTE — Telephone Encounter (Signed)
If the patient has never been seen here we should not refill the medication.

## 2014-01-21 ENCOUNTER — Ambulatory Visit (INDEPENDENT_AMBULATORY_CARE_PROVIDER_SITE_OTHER): Payer: BC Managed Care – PPO | Admitting: Cardiology

## 2014-01-21 ENCOUNTER — Encounter: Payer: Self-pay | Admitting: Cardiology

## 2014-01-21 VITALS — BP 110/80 | HR 79 | Ht 66.0 in | Wt 194.0 lb

## 2014-01-21 DIAGNOSIS — Z720 Tobacco use: Secondary | ICD-10-CM

## 2014-01-21 DIAGNOSIS — R002 Palpitations: Secondary | ICD-10-CM

## 2014-01-21 DIAGNOSIS — F17200 Nicotine dependence, unspecified, uncomplicated: Secondary | ICD-10-CM

## 2014-01-21 DIAGNOSIS — I10 Essential (primary) hypertension: Secondary | ICD-10-CM

## 2014-01-21 NOTE — Patient Instructions (Signed)
Your physician recommends that you have  lab work today--Lipid profile/BMET.   Your physician wants you to follow-up in: 1 year with Dr Aundra Dubin. (November 2016).  You will receive a reminder letter in the mail two months in advance. If you don't receive a letter, please call our office to schedule the follow-up appointment.

## 2014-01-22 LAB — LIPID PANEL
CHOLESTEROL: 224 mg/dL — AB (ref 0–200)
HDL: 37.3 mg/dL — ABNORMAL LOW (ref >39.00)
NonHDL: 186.7
Total CHOL/HDL Ratio: 6
Triglycerides: 300 mg/dL — ABNORMAL HIGH (ref 0.0–149.0)
VLDL: 60 mg/dL — ABNORMAL HIGH (ref 0.0–40.0)

## 2014-01-22 LAB — BASIC METABOLIC PANEL
BUN: 6 mg/dL (ref 6–23)
CO2: 27 mEq/L (ref 19–32)
CREATININE: 0.9 mg/dL (ref 0.4–1.2)
Calcium: 9.1 mg/dL (ref 8.4–10.5)
Chloride: 102 mEq/L (ref 96–112)
GFR: 68.38 mL/min (ref >60.00)
GLUCOSE: 99 mg/dL (ref 70–99)
Potassium: 3.6 mEq/L (ref 3.5–5.1)
Sodium: 136 mEq/L (ref 135–145)

## 2014-01-22 LAB — LDL CHOLESTEROL, DIRECT: Direct LDL: 143.9 mg/dL

## 2014-01-22 NOTE — Progress Notes (Signed)
Patient ID: Theresa Levine, female   DOB: Jul 29, 1950, 63 y.o.   MRN: [id number] PCP: Deatra Ina  63 yo with history of SVT, hypertension, and hyperlipidemia presents for cardiology followup.  She is tolerating her current blood pressure regimen and pressure is controlled.  She is still smoking.  She has not had any recent chest pain.  No exertional dyspnea.  No palpitations.  Overall, she feels ok.   Still working full time.   Labs (4/10): TG 129, HDL 44, LDL 188  Labs (6/10): TSH normal  Labs (1/11): LDL 175, HDL 42  Labs (2/11): K 4.1, creatinine 0.9  Labs (5/12): LDL 146, HDL 45 Labs (12/13): K 3.8, creatinine 0.8  Allergies (verified):  No Known Drug Allergies   Past Medical History:  1. Supraventricular tachycardia. The patient had an episode of SVT in September 2009. Her heart rate was reported to be around 240 by EMS, However, there were no strips to evaluate. It did terminate with adenosine. 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 The patient did have an event monitor done in October 2009 that showed only normal sinus rhythm.  2. Left heart catheterization in September 2009. This showed only luminal irregularities. EF was 60%. Transthoracic echocardiogram also in September 2009 showed an EF of 55-60% with mild mitral regurgitation.  3. History of breast cancer. The patient did have a mastectomy in 1997. She had Adriamycin chemotherapy in 1998. She had radiation to her left chest in 2008.  4. Hypothyroidism.  5. Gastroesophageal reflux disease.  6. Anxiety.  7. Tobacco abuse.  8. HYPERTENSION (ICD-401.9): has not tolerated lisinopril or amlodipine.  Hyponatremia on chlorthalidone.  9. PALPITATIONS (ICD-785.1)  10. Hyperlipidemia: Unable to tolerate Crestor, Lipitor, or pravastatin.  11. Depression.   Family History:  The patient's father had a myocardial infarction at the age of 65. She had a sister who had a pacemaker put in after an episode of syncope and she has a brother who died of  esophageal cancer.   Social History:  The patient is a divorced Occupational hygienist. She does smoke a pack a day, has done so far about 40 years. She drinks minimal alcohol.   ROS: All systems reviewed and negative except as per HPI.   Current Outpatient Prescriptions  Medication Sig Dispense Refill  . aspirin 81 MG tablet Take 81 mg by mouth daily.      . cholecalciferol (VITAMIN D) 1000 UNITS tablet 1 tab a couple times weekly per pt    . esomeprazole (NEXIUM) 40 MG packet Take 40 mg by mouth daily.      Marland Kitchen levothyroxine (LEVOTHROID) 25 MCG tablet Take 25 mcg by mouth daily.      Marland Kitchen losartan (COZAAR) 25 MG tablet TAKE 1 TABLET BY MOUTH DAILY 30 tablet 11  . meclizine (ANTIVERT) 25 MG tablet Take one every 8 hours as needed for dizziness/vertigo 30 tablet 0  . metoprolol succinate (TOPROL-XL) 100 MG 24 hr tablet TAKE 1 TABLET BY MOUTH EVERY DAY 30 tablet 3  . niacin (NIASPAN) 500 MG CR tablet TAKE 1 TABLET BY MOUTH AT BEDTIME 30 tablet 3   No current facility-administered medications for this visit.    BP 110/80 mmHg  Pulse 79  Ht 5\' 6"  (1.676 m)  Wt 194 lb (87.998 kg)  BMI 31.33 kg/m2 General: NAD, overweight.  Neck: No JVD, no thyromegaly or thyroid nodule.  Lungs: Clear to auscultation bilaterally with normal respiratory effort. CV: Nondisplaced PMI.  Heart regular S1/S2, no S3/S4,  no murmur.  No peripheral edema.  No carotid bruit.  Normal pedal pulses.  Abdomen: Soft, nontender, no hepatosplenomegaly, no distention.  Neurologic: Alert and oriented x 3.  Psych: Normal affect. Extremities: No clubbing or cyanosis.   Assessment/Plan: 1. HTN: Good BP control on losartan and Toprol XL. BMET today.   2. SVT: No tachypalpitations.  Continue Toprol XL.  3. Hyperlipidemia: Will get lipids today.  LDL has been high in the past.  She has not tolerated multiple statins.  She is currently on Niaspan.   4. Smoking: I strongly counseled her to quit.    Loralie Champagne 01/22/2014 9:30 PM

## 2014-01-28 ENCOUNTER — Telehealth: Payer: Self-pay

## 2014-01-28 DIAGNOSIS — E785 Hyperlipidemia, unspecified: Secondary | ICD-10-CM

## 2014-01-28 MED ORDER — EZETIMIBE 10 MG PO TABS: 10.0000 mg | ORAL_TABLET | Freq: Every day | ORAL | Status: DC

## 2014-01-28 NOTE — Telephone Encounter (Signed)
I spoke with the pt and made her aware of lab results.  The pt will start Zetia 10mg  daily and have a lipid and liver profile checked in 2 months.

## 2014-02-12 ENCOUNTER — Other Ambulatory Visit: Payer: Self-pay | Admitting: Cardiology

## 2014-03-21 ENCOUNTER — Other Ambulatory Visit: Payer: Self-pay | Admitting: Cardiology

## 2014-04-03 ENCOUNTER — Other Ambulatory Visit: Payer: Self-pay

## 2014-04-24 ENCOUNTER — Other Ambulatory Visit: Payer: Self-pay | Admitting: Cardiology

## 2014-05-26 ENCOUNTER — Other Ambulatory Visit: Payer: Self-pay | Admitting: Cardiology

## 2014-05-28 ENCOUNTER — Other Ambulatory Visit: Payer: Self-pay

## 2014-05-28 MED ORDER — METOPROLOL SUCCINATE ER 100 MG PO TB24: ORAL_TABLET | ORAL | Status: DC

## 2014-06-24 ENCOUNTER — Other Ambulatory Visit: Payer: Self-pay | Admitting: *Deleted

## 2014-06-24 MED ORDER — METOPROLOL SUCCINATE ER 100 MG PO TB24: ORAL_TABLET | ORAL | Status: DC

## 2014-06-24 MED ORDER — LOSARTAN POTASSIUM 25 MG PO TABS: 25.0000 mg | ORAL_TABLET | Freq: Every day | ORAL | Status: DC

## 2014-06-25 ENCOUNTER — Other Ambulatory Visit: Payer: Self-pay | Admitting: Cardiology

## 2014-10-29 ENCOUNTER — Other Ambulatory Visit: Payer: Self-pay | Admitting: Cardiology

## 2015-01-26 ENCOUNTER — Ambulatory Visit (INDEPENDENT_AMBULATORY_CARE_PROVIDER_SITE_OTHER): Payer: PPO | Admitting: Nurse Practitioner

## 2015-01-26 ENCOUNTER — Encounter: Payer: Self-pay | Admitting: Nurse Practitioner

## 2015-01-26 VITALS — BP 140/92 | HR 76 | Ht 66.0 in | Wt 192.8 lb

## 2015-01-26 DIAGNOSIS — I471 Supraventricular tachycardia: Secondary | ICD-10-CM | POA: Diagnosis not present

## 2015-01-26 DIAGNOSIS — Z720 Tobacco use: Secondary | ICD-10-CM | POA: Diagnosis not present

## 2015-01-26 DIAGNOSIS — R002 Palpitations: Secondary | ICD-10-CM

## 2015-01-26 DIAGNOSIS — E785 Hyperlipidemia, unspecified: Secondary | ICD-10-CM | POA: Diagnosis not present

## 2015-01-26 LAB — BASIC METABOLIC PANEL
BUN: 6 mg/dL — ABNORMAL LOW (ref 7–25)
CO2: 25 mmol/L (ref 20–31)
Calcium: 9.2 mg/dL (ref 8.6–10.4)
Chloride: 100 mmol/L (ref 98–110)
Creat: 0.77 mg/dL (ref 0.50–0.99)
Glucose, Bld: 92 mg/dL (ref 65–99)
Potassium: 4.2 mmol/L (ref 3.5–5.3)
Sodium: 134 mmol/L — ABNORMAL LOW (ref 135–146)

## 2015-01-26 LAB — HEPATIC FUNCTION PANEL
ALT: 16 U/L (ref 6–29)
AST: 19 U/L (ref 10–35)
Albumin: 4 g/dL (ref 3.6–5.1)
Alkaline Phosphatase: 68 U/L (ref 33–130)
Bilirubin, Direct: 0.1 mg/dL (ref <=0.2)
Indirect Bilirubin: 0.5 mg/dL (ref 0.2–1.2)
Total Bilirubin: 0.6 mg/dL (ref 0.2–1.2)
Total Protein: 6.7 g/dL (ref 6.1–8.1)

## 2015-01-26 LAB — LIPID PANEL
Cholesterol: 202 mg/dL — ABNORMAL HIGH (ref 125–200)
HDL: 39 mg/dL — ABNORMAL LOW (ref >=46)
LDL Cholesterol: 130 mg/dL — ABNORMAL HIGH (ref <130)
Total CHOL/HDL Ratio: 5.2 Ratio — ABNORMAL HIGH (ref <=5.0)
Triglycerides: 164 mg/dL — ABNORMAL HIGH (ref <150)
VLDL: 33 mg/dL — ABNORMAL HIGH (ref <30)

## 2015-01-26 LAB — TSH: TSH: 3.291 u[IU]/mL (ref 0.350–4.500)

## 2015-01-26 NOTE — Patient Instructions (Addendum)
We will be checking the following labs today - BMET, HPF, Lipids & TSH   Medication Instructions:    Continue with your current medicines.     Testing/Procedures To Be Arranged:  N/A  Follow-Up:   See Dr. Aundra Dubin in one year    Other Special Instructions:   Continue to monitor your BP at home  Try to work on your smoking  More regular exercise is encouraged  Consider seeing the pharmacist in the lipid clinic  Let us know if you would like to have CT scanning of your chest     If you need a refill on your cardiac medications before your next appointment, please call your pharmacy.   Call the [place] office at [phone] if you have any questions, problems or concerns.

## 2015-01-26 NOTE — Progress Notes (Signed)
CARDIOLOGY OFFICE NOTE  Date:  01/26/2015    Theresa Levine Date of Birth: 10/23/1950 Medical Record [id number]  PCP:  Tula Nakayama  Cardiologist:  Aundra Dubin    Chief Complaint  Patient presents with  . Irregular Heart Beat    Annual check - seen for Dr. Aundra Dubin  . Hypertension  . Hyperlipidemia    History of Present Illness: Theresa Levine is a 64 y.o. female who presents today for a follow up visit. Seen for Dr. Aundra Dubin - this is a one year check. She has a history of SVT, hypertension, and hyperlipidemia.  She continues to smoke.   Last seen one year ago and other than her smoking, was doing well.  Comes back today.  Here alone. Says she is doing well. No chest pain. Does have some DOE - this is even with housework. She says it is the same. She continues to smoke - 1 1/2 packs per day and has done so since the age of 62. BP typically ok at home. She has stopped all of her cholesterol medicines - says she does not tolerate anything. She is fasting today. Says she is tolerating her medicines. No refills needed today. No regular exercise. Has gained a little weight since last here.   Past Medical History:  1. Supraventricular tachycardia. The patient had an episode of SVT in September 2009. Her heart rate was reported to be around 240 by EMS, However, there were no strips to evaluate. It did terminate with adenosine. 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 The patient did have an event monitor done in October 2009 that showed only normal sinus rhythm.  2. Left heart catheterization in September 2009. This showed only luminal irregularities. EF was 60%. Transthoracic echocardiogram also in September 2009 showed an EF of 55-60% with mild mitral regurgitation.  3. History of breast cancer. The patient did have a mastectomy in 1997. She had Adriamycin chemotherapy in 1998. She had radiation to her left chest in 2008.  4. Hypothyroidism.  5. Gastroesophageal reflux disease.  6.  Anxiety.  7. Tobacco abuse.  8. HYPERTENSION (ICD-401.9): has not tolerated lisinopril or amlodipine. Hyponatremia on chlorthalidone.  9. PALPITATIONS (ICD-785.1)  10. Hyperlipidemia: Unable to tolerate Crestor, Lipitor, or pravastatin.  11. Depression.    Past Medical History  Diagnosis Date  . Supraventricular tachycardia ([place])     The patient had an episode of SVT in September 2009. Her heart rate was reported  to be around 240 by EMS., However, there were no strips to evaluate. It did terminate with  adenosine. 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 The patient did have an event monitor done in October 2009 that showed only normal sinus rhythm.   . History of percutaneous left heart catheterization September 2009    This showed only luminal irregularities, EF was 60%   . Mild mitral regurgitation by prior echocardiogram September 2009    Showed an EF of 55-60%  . FHx: chemotherapy 1998    Radiation left in chest in 2008  . Breast cancer ([place])   . Hypothyroidism   . Gastroesophageal reflux disease   . Anxiety   . Hypertension   . Tobacco user   . Palpitations   . Hyperlipidemia     unable to tolerate Crestor, Lipitor, or pravastatin  . Depression     Past Surgical History  Procedure Laterality Date  . Mastectomy  1997  . Cardiac catheterization  12/02/2006    [facility]  Olevia Perches, MD. Lumpectomy and radical nide dissection of the left breast     Medications: Current Outpatient Prescriptions  Medication Sig Dispense Refill  . aspirin 81 MG tablet Take 81 mg by mouth daily.      Marland Kitchen esomeprazole (NEXIUM) 40 MG packet Take 40 mg by mouth daily.      Marland Kitchen levothyroxine (LEVOTHROID) 25 MCG tablet Take 25 mcg by mouth daily.      Marland Kitchen losartan (COZAAR) 25 MG tablet TAKE 1 TABLET (25 MG TOTAL) BY MOUTH DAILY. 90 tablet 0  . meclizine (ANTIVERT) 25 MG tablet Take one every 8 hours as needed for dizziness/vertigo 30 tablet 0  . metoprolol succinate (TOPROL-XL) 100 MG 24 hr tablet TAKE 1  TABLET BY MOUTH EVERY DAY 90 tablet 0  . Vitamin D, Ergocalciferol, (DRISDOL) 50000 UNITS CAPS capsule TAKE 1 CAPSULE WEEKLY.  1   No current facility-administered medications for this visit.    Allergies: Allergies  Allergen Reactions  . Sulfa Antibiotics     Social History: The patient  reports that she has been smoking Cigarettes.  She has a 40 pack-year smoking history. She does not have any smokeless tobacco history on file. She reports that she drinks alcohol.   Family History: The patient's family history includes Esophageal cancer in her brother; Fainting in her sister; Heart attack (age of onset: 15) in her father; Stroke in her paternal grandfather.   Review of Systems: Please see the history of present illness.   Otherwise, the review of systems is positive for DOE, depression, back pain, easy bruising and anxiety.   All other systems are reviewed and negative.   Physical Exam: VS:  BP 140/92 mmHg  Pulse 76  Ht 5\' 6"  (1.676 m)  Wt 192 lb 12.8 oz (87.454 kg)  BMI 31.13 kg/m2 .  BMI Body mass index is 31.13 kg/(m^2).  Wt Readings from Last 3 Encounters:  01/26/15 192 lb 12.8 oz (87.454 kg)  01/21/14 194 lb (87.998 kg)  10/09/13 189 lb (85.73 kg)    General: Pleasant. She is obese. She is in no acute distress. Seems a little depressed to me.  HEENT: Normal. Neck: Supple, no JVD, carotid bruits, or masses noted.  Cardiac: Regular rate and rhythm. No murmurs, rubs, or gallops. No edema.  Respiratory:  Lungs are clear to auscultation bilaterally with normal work of breathing.  GI: Soft and nontender.  MS: No deformity or atrophy. Gait and ROM intact. Skin: Warm and dry. Color is normal.  Neuro:  Strength and sensation are intact and no gross focal deficits noted.  Psych: Alert, appropriate and with normal affect.   LABORATORY DATA:  EKG:  EKG is ordered today. This demonstrates NSR.  Lab Results  Component Value Date   WBC 5.2 12/02/2007   HGB 13.1 12/02/2007    HCT 38.1 12/02/2007   PLT 154 12/02/2007   GLUCOSE 99 01/21/2014   CHOL 224* 01/21/2014   TRIG 300.0* 01/21/2014   HDL 37.30* 01/21/2014   LDLDIRECT 143.9 01/21/2014   ALT 16 07/09/2012   AST 19 07/09/2012   NA 136 01/21/2014   K 3.6 01/21/2014   CL 102 01/21/2014   CREATININE 0.9 01/21/2014   BUN 6 01/21/2014   CO2 27 01/21/2014   TSH 3.22 08/31/2008   INR 1.2 12/02/2007    BNP (last 3 results) No results for input(s): BNP in the last 8760 hours.  ProBNP (last 3 results) No results for input(s): PROBNP in the last 8760 hours.  Other Studies Reviewed Today:   Assessment/Plan:  1. HTN: Recheck by me is 130/90 - says she has better control at home. Encouraged her to continue to monitor.    2. SVT: No tachypalpitations. Continue Toprol XL.   3. Hyperlipidemia: Will get lipids today. LDL has been high in the past. She has not tolerated multiple agents.    4. Smoking: I strongly counseled her to quit. She does not seem ready to quit. Did discuss the CT scanning for lung cancer - she will think about.   5. Obesity - needs to work on diet/exercise/weight loss. She does not seem to be motivated to make changes.   Current medicines are reviewed with the patient today.  The patient does not have concerns regarding medicines other than what has been noted above.  The following changes have been made:  See above.  Labs/ tests ordered today include:    Orders Placed This Encounter  Procedures  . Basic metabolic panel  . Hepatic function panel  . Lipid panel  . TSH  . EKG 12-Lead     Disposition:   FU with Dr. Aundra Dubin in one year.  Patient is agreeable to this plan and will call if any problems develop in the interim.   Signed: Burtis Junes, RN, ANP-C 01/26/2015 11:03 AM  [facility] [address] [place], [place]  [address] Phone: [phone] Fax: [phone]

## 2015-02-03 ENCOUNTER — Ambulatory Visit (INDEPENDENT_AMBULATORY_CARE_PROVIDER_SITE_OTHER): Payer: PPO | Admitting: Pharmacist

## 2015-02-03 DIAGNOSIS — E785 Hyperlipidemia, unspecified: Secondary | ICD-10-CM | POA: Diagnosis not present

## 2015-02-03 MED ORDER — ROSUVASTATIN CALCIUM 10 MG PO TABS: ORAL_TABLET | ORAL | Status: DC

## 2015-02-03 NOTE — Patient Instructions (Signed)
Start taking Crestor 10mg  3 times a week. Start going for walks for 30 minutes each day. Switch your Suezanne Jacquet and Jerry's ice cream to a different brand with lower fat You can pick up Nicorette gum over the counter (4mg ) to help with quitting smoking Call Nalla Purdy in lipid clinic with any questions at [phone] [facility] labs on February 28. Lab opens at 7:30am, come in any time after for fasting lipid panel

## 2015-02-03 NOTE — Progress Notes (Signed)
Patient ID: Theresa Levine                 DOB: Aug 26, 1950, 64 yo                         MRN: [id number]     HPI: Theresa Levine is a 64 y.o. female patient referred to lipid clinic by Truitt Merle. PMH is significant for HTN, HLD, SVT, non-obstructive CAD, and NSTEMI in 2009. Patient has a history of intolerances to multiple statins and presents to lipid clinic for further management of her cholesterol.  Current Medications: none Intolerances: Lipitor (stomach tightness), Crestor (myalgias), pravastatin (stomach tightness and myalgias), Zetia - was discontinued at last office visit but patient does not recall any intolerances with this. She also took niacin in the past and reports that she tolerated it but that it was not effective. Risk Factors: ASCVD - NSTEMI in 2009, non-obstructive CAD LDL goal: 70mg /dL  Diet: Eats about once a day. Likes lean cuisines, PB crackers, yogurt, sandwich. Doesn't cook much, doesn't eat much meat or veggies. Loves ice cream, eats [place] and Jerry's.  Exercise: Does housework, no other real exercise.  Family History: Heart attack in her father (age 76), stroke in her paternal grandfather.  Social History: Patient reports that she has been smoking cigarettes. She has a 40 pack-year smoking history. She smokes 1 and 1/2 packs per day. She reports that she drinks alcohol.  Labs: 01/2015: TC 202, TG 164, HDL 39, LDL 130, LFTs wnl (on no therapy)  Past Medical History  Diagnosis Date  . Supraventricular tachycardia ([place])     The patient had an episode of SVT in September 2009. Her heart rate was reported  to be around 240 by EMS., However, there were no strips to evaluate. It did terminate with  adenosine. 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 The patient did have an event monitor done in October 2009 that showed only normal sinus rhythm.   . History of percutaneous left heart catheterization September 2009    This showed only luminal irregularities, EF was 60%     . Mild mitral regurgitation by prior echocardiogram September 2009    Showed an EF of 55-60%  . FHx: chemotherapy 1998    Radiation left in chest in 2008  . Breast cancer ([place])   . Hypothyroidism   . Gastroesophageal reflux disease   . Anxiety   . Hypertension   . Tobacco user   . Palpitations   . Hyperlipidemia     unable to tolerate Crestor, Lipitor, or pravastatin  . Depression     Current Outpatient Prescriptions on File Prior to Visit  Medication Sig Dispense Refill  . aspirin 81 MG tablet Take 81 mg by mouth daily.      Marland Kitchen esomeprazole (NEXIUM) 40 MG packet Take 40 mg by mouth daily.      Marland Kitchen levothyroxine (LEVOTHROID) 25 MCG tablet Take 25 mcg by mouth daily.      Marland Kitchen losartan (COZAAR) 25 MG tablet TAKE 1 TABLET (25 MG TOTAL) BY MOUTH DAILY. 90 tablet 0  . meclizine (ANTIVERT) 25 MG tablet Take one every 8 hours as needed for dizziness/vertigo 30 tablet 0  . metoprolol succinate (TOPROL-XL) 100 MG 24 hr tablet TAKE 1 TABLET BY MOUTH EVERY DAY 90 tablet 0  . Vitamin D, Ergocalciferol, (DRISDOL) 50000 UNITS CAPS capsule TAKE 1 CAPSULE WEEKLY.  1   No current facility-administered medications on file  prior to visit.    Allergies  Allergen Reactions  . Sulfa Antibiotics     Assessment/Plan:  1. Hyperlipidemia - Patient's LDL currently above goal 70mg /dL given ASCVD (CAD and NSTEMI in 2009). Pt has intolerances to Crestor, Lipitor, and pravastatin although she does not recall doses. Pt willing to rechallenge with low dose statin. Will start Crestor 10mg  TIW with directions to titrate up or down as tolerated. She will call clinic if she cannot tolerate it. Would start Zetia at that time. Patient will also work to increase her exercise and go for daily walks and switch her ice cream to a type with less fat.  Will recheck labs in 3 months.  2. Smoking cessation - Discussed with patient that quitting smoking is one of the most important decisions she can make for her health. She  states that some days she wants to quit, other days she does not. Educated patient on Nicorette gum and chewing technique - she will consider buying this OTC to help with cravings.   Megan E. Supple, PharmD [place] [id number], [place], [place] [address] Phone: [phone]; Fax: [phone] 02/03/2015 12:16 PM

## 2015-03-11 DIAGNOSIS — K1121 Acute sialoadenitis: Secondary | ICD-10-CM | POA: Diagnosis not present

## 2015-03-22 ENCOUNTER — Other Ambulatory Visit: Payer: Self-pay | Admitting: Cardiology

## 2015-04-19 ENCOUNTER — Other Ambulatory Visit: Payer: Self-pay | Admitting: Cardiology

## 2015-05-04 ENCOUNTER — Other Ambulatory Visit: Payer: PPO

## 2015-05-11 ENCOUNTER — Other Ambulatory Visit (INDEPENDENT_AMBULATORY_CARE_PROVIDER_SITE_OTHER): Payer: PPO | Admitting: *Deleted

## 2015-05-11 DIAGNOSIS — E785 Hyperlipidemia, unspecified: Secondary | ICD-10-CM | POA: Diagnosis not present

## 2015-05-11 DIAGNOSIS — Z1231 Encounter for screening mammogram for malignant neoplasm of breast: Secondary | ICD-10-CM | POA: Diagnosis not present

## 2015-05-12 LAB — HEPATIC FUNCTION PANEL
ALBUMIN: 4.2 g/dL (ref 3.6–5.1)
ALT: 16 U/L (ref 6–29)
AST: 22 U/L (ref 10–35)
Alkaline Phosphatase: 64 U/L (ref 33–130)
Bilirubin, Direct: 0.1 mg/dL (ref <=0.2)
Indirect Bilirubin: 0.6 mg/dL (ref 0.2–1.2)
Total Bilirubin: 0.7 mg/dL (ref 0.2–1.2)
Total Protein: 7.3 g/dL (ref 6.1–8.1)

## 2015-05-12 LAB — LIPID PANEL
CHOL/HDL RATIO: 3.3 ratio (ref <=5.0)
CHOLESTEROL: 145 mg/dL (ref 125–200)
HDL: 44 mg/dL — ABNORMAL LOW (ref >=46)
LDL Cholesterol: 66 mg/dL (ref <130)
Triglycerides: 177 mg/dL — ABNORMAL HIGH (ref <150)
VLDL: 35 mg/dL — ABNORMAL HIGH (ref <30)

## 2015-05-21 DIAGNOSIS — M25561 Pain in right knee: Secondary | ICD-10-CM | POA: Diagnosis not present

## 2015-05-21 DIAGNOSIS — M25562 Pain in left knee: Secondary | ICD-10-CM | POA: Diagnosis not present

## 2015-05-26 ENCOUNTER — Telehealth: Payer: Self-pay | Admitting: Pharmacist

## 2015-05-26 NOTE — Telephone Encounter (Signed)
Pt called and stated she is having problems tolerating Crestor.  She started 10mg  three days a week in December.  She did fine until middle of January when she started noticing knee pain and difficulty walking.  She states she also started having more dizziness and forgetfulness.  She stopped the Crestor on 3/15 but has not noticed much of a difference in her symptoms.  She did see her PCP last week.  They thought she may have osteoarthritis or brusitis in her knee and did an Xray.  This came back normal.    Informed patient that these side effects are not unusual with Crestor.  Given she has not taken a dose in a week, would expect more of an improvement in symptoms.  Suggest she stay off Crestor and if her symptoms have not improved within another week would suggest follow up with PCP to look for other causes of her symptoms.

## 2015-06-03 ENCOUNTER — Telehealth: Payer: Self-pay | Admitting: Pharmacist

## 2015-06-03 NOTE — Telephone Encounter (Signed)
Pt called to report that her joint aches are still persisting despite discontinuing Crestor about 2 weeks ago. She states that her joints feel better when she is active, but that they freeze up when she sits down. Advised her to stop her Crestor. Symptoms should resolve if aches related to statin use. Advised that if myalgias continue to see her PCP again. She has been using about 2g of Tylenol a day to help with the pain and that BioFreeze helps a bit. Advised her that BioFreeze is fine to take, and that she could try using diclofenac sparingly to better help with inflammation. She does know to limit its use d/t it being an NSAID with her cardiac disease. She will call once she is feeling better - goal is to start a trial of Zetia if she is agreeable to trying another medication.

## 2015-06-21 DIAGNOSIS — M25562 Pain in left knee: Secondary | ICD-10-CM | POA: Diagnosis not present

## 2015-06-21 DIAGNOSIS — M25561 Pain in right knee: Secondary | ICD-10-CM | POA: Diagnosis not present

## 2015-07-08 ENCOUNTER — Telehealth: Payer: Self-pay | Admitting: Pharmacist

## 2015-07-08 NOTE — Telephone Encounter (Signed)
Patient called to report that she is still having knee pain although it is improved since stopping Crestor a month ago. She is already intolerant to Lipitor and pravastatin. She states that she does not want to take any more statins and that she is done with cholesterol medicine. Discussed trialing Zetia when we spoke a few weeks ago, she states that she has made up her mind and will not take any cholesterol lowering medications. Discussed benefits of lowering her cholesterol extensively to help prevent a future heart attack and she is aware that her risk for future events is higher if she does not take any lipid lowering therapy. Also discussed PCSK9i briefly - she has Medicare and copay would be too expensive. She is not interested in being in one of the upcoming lipid trials at [facility], either. Asked pt to call if she changes her mind about a trial of Zetia and she expressed understanding.

## 2015-07-17 ENCOUNTER — Other Ambulatory Visit: Payer: Self-pay | Admitting: Cardiology

## 2015-08-11 ENCOUNTER — Telehealth: Payer: Self-pay | Admitting: Cardiology

## 2015-08-11 NOTE — Telephone Encounter (Signed)
New message   Patient c/o Palpitations:  High priority if patient c/o lightheadedness and shortness of breath.  1. How long have you been having palpitations? On and off for 2 weeks 2. Are you currently experiencing lightheadedness and shortness of breath? no 3. Have you checked your BP and heart rate? (document readings) no  4. Are you experiencing any other symptoms? no

## 2015-08-13 NOTE — Telephone Encounter (Signed)
Pt states that she has been having palpitations at night for about a 1 -1 1/2 weeks.  Pt states palpitations usually lasts for 5-10 minutes.

## 2015-08-13 NOTE — Telephone Encounter (Signed)
Pt states she has not had palpitations in 2 days.  Pt states she feels tired frequently and feels this may be a result of taking rosuvastatin in the past. Pt scheduled to see Richardson Dopp, PA 08/16/15 at 11:45 AM.    Note--this message was sent to me 08/11/15. I was off 08/11/15 and 08/12/15, my out of office notification was on for the dates.  I have discussed with Corene Cornea and apologized to the patient for the delay in calling back.

## 2015-08-15 NOTE — Progress Notes (Signed)
Cardiology Office Note:    Date:  08/16/2015   ID:  Theresa Levine, DOB [birth year], MRN [id number]  PCP:  Tula Nakayama  Cardiologist:  Dr. Loralie Champagne   Electrophysiologist:  n/a  Referring MD: Aletha Halim., PA-C   Chief Complaint  Patient presents with  . Palpitations    History of Present Illness:     Theresa Levine is a 65 y.o. female with a hx of SVT, HTN, HL  Last seen by Truitt Merle, NP in 11/16.  She was referred to the [facility] for management of HL given intol of statins in the past.  She tried Crestor again but could not tolerate it.    She returns today for further evaluation of palpitations. She notes skipped heartbeats most nights for the past 2 weeks. However, for the past several days, she has not had further symptoms. She denies associated symptoms. She has not started any new medications. She denies excessive caffeine use. She is concerned that it is all related to Crestor. When she resumed Crestor in January, she started having significant arthralgias. She had difficulty ambulating secondary to knee pain. She notes significant fatigue. She also notes dyspnea with moderate to extreme activities. She denies orthopnea, PND or edema. She denies syncope. She continues to smoke cigarettes. She notes occasional cough as well as occasional wheezing. She's not certain if she has a history of snoring.   Past Medical History  Diagnosis Date  . Supraventricular tachycardia ([place])     The patient had an episode of SVT in September 2009. Her heart rate was reported  to be around 240 by EMS., However, there were no strips to evaluate. It did terminate with  adenosine. 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 The patient did have an event monitor done in October 2009 that showed only normal sinus rhythm.   . History of percutaneous left heart catheterization September 2009    This showed only luminal irregularities, EF was 60%   . Mild mitral regurgitation by prior  echocardiogram September 2009    Showed an EF of 55-60%  . FHx: chemotherapy 1998    Radiation left in chest in 2008  . Breast cancer ([place])   . Hypothyroidism   . Gastroesophageal reflux disease   . Anxiety   . Hypertension   . Tobacco user   . Palpitations   . Hyperlipidemia     unable to tolerate Crestor, Lipitor, or pravastatin  . Depression   Past Medical History: 1. Supraventricular tachycardia. The patient had an episode of SVT in September 2009. Her heart rate was reported to be around 240 by EMS, However, there were no strips to evaluate. It did terminate with adenosine. 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 The patient did have an event monitor done in October 2009 that showed only normal sinus rhythm.  2. Left heart catheterization in September 2009. This showed only luminal irregularities. EF was 60%. Transthoracic echocardiogram also in September 2009 showed an EF of 55-60% with mild mitral regurgitation.  3. History of breast cancer. The patient did have a mastectomy in 1997. She had Adriamycin chemotherapy in 1998. She had radiation to her left chest in 2008.  4. Hypothyroidism.  5. Gastroesophageal reflux disease.  6. Anxiety.  7. Tobacco abuse.  8. HYPERTENSION (ICD-401.9): has not tolerated lisinopril or amlodipine. Hyponatremia on chlorthalidone.  9. PALPITATIONS (ICD-785.1)  10. Hyperlipidemia: Unable to tolerate Crestor, Lipitor, or pravastatin.  11. Depression.  Past Surgical History  Procedure Laterality Date  . Mastectomy  1997  . Cardiac catheterization  12/02/2006    Eustace Quail, MD. Lumpectomy and radical nide dissection of the left breast    Current Medications: Outpatient Prescriptions Prior to Visit  Medication Sig Dispense Refill  . aspirin 81 MG tablet Take 81 mg by mouth daily.      Marland Kitchen esomeprazole (NEXIUM) 40 MG packet Take 40 mg by mouth daily.      Marland Kitchen levothyroxine (LEVOTHROID) 25 MCG tablet Take 25 mcg by mouth daily.      Marland Kitchen losartan (COZAAR)  25 MG tablet TAKE 1 TABLET (25 MG TOTAL) BY MOUTH DAILY. 90 tablet 1  . meclizine (ANTIVERT) 25 MG tablet Take one every 8 hours as needed for dizziness/vertigo 30 tablet 0  . metoprolol succinate (TOPROL-XL) 100 MG 24 hr tablet TAKE 1 TABLET BY MOUTH EVERY DAY 90 tablet 2  . Vitamin D, Ergocalciferol, (DRISDOL) 50000 UNITS CAPS capsule Reported on 08/16/2015  1   No facility-administered medications prior to visit.      Allergies:   Crestor; Sulfa antibiotics; and Sulfacetamide sodium   Social History   Social History  . Marital Status: Widowed    Spouse Name: N/A  . Number of Children: N/A  . Years of Education: N/A   Occupational History  . Occupational hygienist    Social History Main Topics  . Smoking status: Current Every Day Smoker -- 1.00 packs/day for 40 years    Types: Cigarettes  . Smokeless tobacco: None  . Alcohol Use: 0.0 oz/week    0 Standard drinks or equivalent per week     Comment: Minimal  . Drug Use: None  . Sexual Activity: Not Asked   Other Topics Concern  . None   Social History Narrative     Family History:  The patient's family history includes Esophageal cancer in her brother; Fainting in her sister; Heart attack (age of onset: 53) in her father; Stroke in her paternal grandfather.   ROS:   Please see the history of present illness.    Review of Systems  Constitution: Positive for malaise/fatigue.  Cardiovascular: Positive for dyspnea on exertion and irregular heartbeat.  Neurological: Positive for dizziness.   All other systems reviewed and are negative.   Physical Exam:    VS:  BP 140/80 mmHg  Pulse 72  Ht 5\' 6"  (1.676 m)  Wt 192 lb 6.4 oz (87.272 kg)  BMI 31.07 kg/m2   Physical Exam  Constitutional: She is oriented to person, place, and time. She appears well-developed and well-nourished.  HENT:  Head: Normocephalic and atraumatic.  Neck: Normal range of motion. No JVD present.  Cardiovascular: Normal rate, regular rhythm and normal  heart sounds.   No murmur heard. Pulmonary/Chest: Effort normal. She has no wheezes. She has no rales.  Abdominal: Soft. There is no tenderness.  Musculoskeletal: Normal range of motion. She exhibits no edema.  Neurological: She is alert and oriented to person, place, and time.  Skin: Skin is warm and dry.  Psychiatric: She has a normal mood and affect.    Wt Readings from Last 3 Encounters:  08/16/15 192 lb 6.4 oz (87.272 kg)  01/26/15 192 lb 12.8 oz (87.454 kg)  01/21/14 194 lb (87.998 kg)      Studies/Labs Reviewed:     EKG:  EKG is   ordered today.  The ekg ordered today demonstrates NSR, HR 73, normal axis, QTC 440 ms  Recent Labs: 01/26/2015: BUN 6*; Creat 0.77;  Potassium 4.2; Sodium 134*; TSH 3.291 05/11/2015: ALT 16   Recent Lipid Panel    Component Value Date/Time   CHOL 145 05/11/2015 1438   TRIG 177* 05/11/2015 1438   HDL 44* 05/11/2015 1438   CHOLHDL 3.3 05/11/2015 1438   VLDL 35* 05/11/2015 1438   LDLCALC 66 05/11/2015 1438   LDLDIRECT 143.9 01/21/2014 1638    Additional studies/ records that were reviewed today include:   - LHC (9/09): LAD and circumflex with irregularities and ectasia, proximal and mid RCA 30%, EF 60%. - Echo (9/09): EF 55-65%, mild MR   ASSESSMENT:     1. Palpitations   2. DOE (dyspnea on exertion)   3. Essential hypertension   4. Hyperlipidemia   5. Other fatigue   6. TOBACCO ABUSE     PLAN:     In order of problems listed above:  1. Palpitations - Etiology not entirely clear. She does have a history of SVT.  Her symptoms have resolved. We discussed the possibility of proceeding with Holter versus event monitoring. Given resolution of her symptoms, I will hold off on this for now. If she has recurrent palpitations, she will call to arrange 30 day event monitor. Obtain BMET, magnesium today.   2. Dyspnea  - She has noted dyspnea with some activities. She does not appear volume overloaded on exam. She does continue to smoke.  She is concerned that her fatigue and dyspnea may be related to Crestor. She stopped this several months ago. She had minimal plaque on her cardiac catheterization in 2009. She does continue to smoke.   -  Obtain echocardiogram. If PASP elevated, consider sleep study.   -  Obtain plain exercise treadmill test.    3.  HTN  -  Blood pressure has been uncontrolled. Repeat blood pressure by me is 144/90. Given her recent palpitations, I will increase her Toprol XL to 150 mg daily.   4. HL  - She has been intolerant to statins. She is currently not interested in trying alternate therapies.   5. Fatigue - Suspect she may have sleep apnea. She has a questionable history of snoring but does live alone. If PASP elevated, arrange sleep study.   6. Tobacco abuse - I have advised her to quit smoking.     Medication Adjustments/Labs and Tests Ordered: Current medicines are reviewed at length with the patient today.  Concerns regarding medicines are outlined above.  Medication changes, Labs and Tests ordered today are outlined in the Patient Instructions noted below. Patient Instructions  Medication Instructions:  START TAKING TOPROL XL 150 MG ONCE A DAY  If you need a refill on your cardiac medications before your next appointment, please call your pharmacy. Labwork: BMET AND MAG TODAY  Testing/Procedures:  Your physician has requested that you have an echocardiogram. Echocardiography is a painless test that uses sound waves to create images of your heart. It provides your doctor with information about the size and shape of your heart and how well your heart's chambers and valves are working. This procedure takes approximately one hour. There are no restrictions for this procedure. Your physician has requested that you have an exercise tolerance test. For further information please visit HugeFiesta.tn. Please also follow instruction sheet, as given. Follow-Up:  IN 3 MONTHS WITH DR [facility] OR  Dino [facility]treger Any Other Special Instructions Will Be Listed Below (If Applicable).   Signed, Richardson Dopp, PA-C  08/16/2015 1:01 PM    [place]  [address], [place], [place]  [address] Phone: [phone]; Fax: [phone]

## 2015-08-16 ENCOUNTER — Ambulatory Visit (INDEPENDENT_AMBULATORY_CARE_PROVIDER_SITE_OTHER): Payer: PPO | Admitting: Physician Assistant

## 2015-08-16 ENCOUNTER — Encounter: Payer: Self-pay | Admitting: Physician Assistant

## 2015-08-16 VITALS — BP 140/80 | HR 72 | Ht 66.0 in | Wt 192.4 lb

## 2015-08-16 DIAGNOSIS — R002 Palpitations: Secondary | ICD-10-CM

## 2015-08-16 DIAGNOSIS — R0609 Other forms of dyspnea: Secondary | ICD-10-CM | POA: Diagnosis not present

## 2015-08-16 DIAGNOSIS — I10 Essential (primary) hypertension: Secondary | ICD-10-CM

## 2015-08-16 DIAGNOSIS — E785 Hyperlipidemia, unspecified: Secondary | ICD-10-CM | POA: Diagnosis not present

## 2015-08-16 DIAGNOSIS — F17200 Nicotine dependence, unspecified, uncomplicated: Secondary | ICD-10-CM

## 2015-08-16 DIAGNOSIS — R5383 Other fatigue: Secondary | ICD-10-CM

## 2015-08-16 MED ORDER — METOPROLOL SUCCINATE ER 100 MG PO TB24: ORAL_TABLET | ORAL | Status: DC

## 2015-08-16 NOTE — Patient Instructions (Addendum)
Medication Instructions:  START TAKING TOPROL XL 150 MG ONCE A DAY  If you need a refill on your cardiac medications before your next appointment, please call your pharmacy. Labwork: BMET AND MAG TODAY  Testing/Procedures:  Your physician has requested that you have an echocardiogram. Echocardiography is a painless test that uses sound waves to create images of your heart. It provides your doctor with information about the size and shape of your heart and how well your heart's chambers and valves are working. This procedure takes approximately one hour. There are no restrictions for this procedure. Your physician has requested that you have an exercise tolerance test. For further information please visit HugeFiesta.tn. Please also follow instruction sheet, as given. Follow-Up:  IN 3 MONTHS WITH DR [facility] OR [facility] Any Other Special Instructions Will Be Listed Below (If Applicable).

## 2015-08-17 ENCOUNTER — Telehealth: Payer: Self-pay | Admitting: Cardiology

## 2015-08-17 LAB — BASIC METABOLIC PANEL
BUN: 6 mg/dL — AB (ref 7–25)
CO2: 24 mmol/L (ref 20–31)
CREATININE: 0.82 mg/dL (ref 0.50–0.99)
Calcium: 9.8 mg/dL (ref 8.6–10.4)
Chloride: 100 mmol/L (ref 98–110)
GLUCOSE: 97 mg/dL (ref 65–99)
Potassium: 4.5 mmol/L (ref 3.5–5.3)
Sodium: 137 mmol/L (ref 135–146)

## 2015-08-17 LAB — MAGNESIUM: Magnesium: 1.8 mg/dL (ref 1.5–2.5)

## 2015-08-17 NOTE — Telephone Encounter (Signed)
Spoke with patient about lab results from 08/16/15

## 2015-08-17 NOTE — Telephone Encounter (Signed)
New message     The pt is returning the nurses call about the lab results.

## 2015-08-24 DIAGNOSIS — E039 Hypothyroidism, unspecified: Secondary | ICD-10-CM | POA: Diagnosis not present

## 2015-08-24 DIAGNOSIS — E559 Vitamin D deficiency, unspecified: Secondary | ICD-10-CM | POA: Diagnosis not present

## 2015-08-24 DIAGNOSIS — E785 Hyperlipidemia, unspecified: Secondary | ICD-10-CM | POA: Diagnosis not present

## 2015-09-01 ENCOUNTER — Encounter: Payer: Self-pay | Admitting: Physician Assistant

## 2015-09-01 ENCOUNTER — Ambulatory Visit (INDEPENDENT_AMBULATORY_CARE_PROVIDER_SITE_OTHER): Payer: PPO

## 2015-09-01 ENCOUNTER — Ambulatory Visit (HOSPITAL_COMMUNITY): Payer: PPO | Attending: Cardiology

## 2015-09-01 ENCOUNTER — Other Ambulatory Visit: Payer: Self-pay

## 2015-09-01 DIAGNOSIS — E785 Hyperlipidemia, unspecified: Secondary | ICD-10-CM | POA: Insufficient documentation

## 2015-09-01 DIAGNOSIS — R0600 Dyspnea, unspecified: Secondary | ICD-10-CM | POA: Diagnosis not present

## 2015-09-01 DIAGNOSIS — I340 Nonrheumatic mitral (valve) insufficiency: Secondary | ICD-10-CM | POA: Insufficient documentation

## 2015-09-01 DIAGNOSIS — Z720 Tobacco use: Secondary | ICD-10-CM | POA: Insufficient documentation

## 2015-09-01 DIAGNOSIS — Z8249 Family history of ischemic heart disease and other diseases of the circulatory system: Secondary | ICD-10-CM | POA: Diagnosis not present

## 2015-09-01 DIAGNOSIS — I10 Essential (primary) hypertension: Secondary | ICD-10-CM | POA: Insufficient documentation

## 2015-09-01 DIAGNOSIS — R0609 Other forms of dyspnea: Secondary | ICD-10-CM | POA: Insufficient documentation

## 2015-09-01 DIAGNOSIS — R5383 Other fatigue: Secondary | ICD-10-CM

## 2015-09-01 LAB — EXERCISE TOLERANCE TEST
CHL RATE OF PERCEIVED EXERTION: 17
CSEPEDS: 0 s
Estimated workload: 4.6 METS
Exercise duration (min): 3 min
MPHR: 153 {beats}/min
Peak HR: 141 {beats}/min
Percent HR: 92 %
Rest HR: 74 {beats}/min

## 2015-09-02 ENCOUNTER — Encounter: Payer: Self-pay | Admitting: *Deleted

## 2015-09-02 ENCOUNTER — Telehealth: Payer: Self-pay | Admitting: *Deleted

## 2015-09-02 DIAGNOSIS — I10 Essential (primary) hypertension: Secondary | ICD-10-CM

## 2015-09-02 MED ORDER — LOSARTAN POTASSIUM 50 MG PO TABS: 50.0000 mg | ORAL_TABLET | Freq: Every day | ORAL | Status: DC

## 2015-09-02 NOTE — Telephone Encounter (Signed)
This encounter was created in error - please disregard.

## 2015-09-02 NOTE — Telephone Encounter (Signed)
Pt notified of both echo and stress test results by phone with verbal understanding. Pt aware Dr. Aundra Dubin still to review echo and that we will call her after he has reviewed echo with any recommendations he may have. Pt agreeable to plan of care.

## 2015-09-06 ENCOUNTER — Telehealth: Payer: Self-pay | Admitting: *Deleted

## 2015-09-06 NOTE — Telephone Encounter (Signed)
S/w pt today in regards to recommendation per Dr. Aundra Dubin to schedule a myoview for pt. I went over recommendation w/pt. I explained to pt how the myoview can help see the heart a little better, Inferior wall not moving correctly. Went over some instructions and when I got to the time frame of 3-4 hours pt stated she does not want to do this. Pt said she cannot lay down for that long. I stated that she will not be laying down all that time. I explained to pt that this takes 3-4 hours due to it is done in segments. Checking the heart at different times to help r/o any possible blockages. Pt asked if this is like the angiogram, like a heart cath. I said no the heart cath is an invasive test where as the myoview is non invasive. Pt states she has had a cath before and states she told she had a 20 % blockage on the back part of her heart, per pt. Pt asked is this the same area that Dr. Aundra Dubin is looking at on the echo. I advised pt no that he is seeing the front part of the heart wall motion not moving correctly, so the myoview can really help get a better look at this. Pt stated again she is not really wanting to do a test that takes 3-4 hours. I stated I will let Dr. Aundra Dubin know her discission. Pt said thank you. I advised pt if she changes her mind to call the office so that we may schedule test. Pt said ok.

## 2015-09-09 ENCOUNTER — Other Ambulatory Visit (INDEPENDENT_AMBULATORY_CARE_PROVIDER_SITE_OTHER): Payer: PPO

## 2015-09-09 ENCOUNTER — Telehealth: Payer: Self-pay | Admitting: *Deleted

## 2015-09-09 DIAGNOSIS — I2510 Atherosclerotic heart disease of native coronary artery without angina pectoris: Secondary | ICD-10-CM

## 2015-09-09 DIAGNOSIS — R002 Palpitations: Secondary | ICD-10-CM

## 2015-09-09 DIAGNOSIS — I10 Essential (primary) hypertension: Secondary | ICD-10-CM

## 2015-09-09 DIAGNOSIS — R5382 Chronic fatigue, unspecified: Secondary | ICD-10-CM

## 2015-09-09 LAB — BASIC METABOLIC PANEL
BUN: 5 mg/dL — AB (ref 7–25)
CO2: 27 mmol/L (ref 20–31)
CREATININE: 0.84 mg/dL (ref 0.50–0.99)
Calcium: 9 mg/dL (ref 8.6–10.4)
Chloride: 98 mmol/L (ref 98–110)
Glucose, Bld: 106 mg/dL — ABNORMAL HIGH (ref 65–99)
Potassium: 4.7 mmol/L (ref 3.5–5.3)
Sodium: 130 mmol/L — ABNORMAL LOW (ref 135–146)

## 2015-09-09 NOTE — Telephone Encounter (Signed)
S/w pt today in regards to [facility]. Pt was given the option to do a stress echo per PA. Went over in lengthy detail with the pt the difference between the 2 test and what's involved. Pt has agreed to the stress echo. Advised [facility] will call her with appt. I will placed order in Epic today and mail out instructions. I did go over verbal instructions as well with pt verbalized understanding. I was going to advise pt to hold Toprol the morning of stress echo when pt states she takes it in the evening around 7 pm. I asked pt if she would take Toprol about 5 pm the night before the stress echo, pt agreeable. I will let Dr. Aundra Dubin and Richardson Dopp, [facility] that we will proceed with the Stress Echo.

## 2015-09-10 ENCOUNTER — Telehealth: Payer: Self-pay | Admitting: *Deleted

## 2015-09-10 DIAGNOSIS — R5382 Chronic fatigue, unspecified: Secondary | ICD-10-CM

## 2015-09-10 DIAGNOSIS — I10 Essential (primary) hypertension: Secondary | ICD-10-CM

## 2015-09-10 NOTE — Telephone Encounter (Signed)
Pt notified of lab results and findings by phone with verbal understanding. Pt advised to cut back on fluids to about 1500 cc's daily, bmet 7/13. Pt also advised on upcoming stress echo test to hold Toprol the PM before test. Pt agreeable to plan of care

## 2015-09-16 ENCOUNTER — Other Ambulatory Visit (INDEPENDENT_AMBULATORY_CARE_PROVIDER_SITE_OTHER): Payer: PPO | Admitting: *Deleted

## 2015-09-16 DIAGNOSIS — I10 Essential (primary) hypertension: Secondary | ICD-10-CM | POA: Diagnosis not present

## 2015-09-16 DIAGNOSIS — R5382 Chronic fatigue, unspecified: Secondary | ICD-10-CM

## 2015-09-16 LAB — BASIC METABOLIC PANEL
BUN: 8 mg/dL (ref 7–25)
CHLORIDE: 101 mmol/L (ref 98–110)
CO2: 26 mmol/L (ref 20–31)
Calcium: 9.2 mg/dL (ref 8.6–10.4)
Creat: 0.92 mg/dL (ref 0.50–0.99)
Glucose, Bld: 94 mg/dL (ref 65–99)
POTASSIUM: 4.2 mmol/L (ref 3.5–5.3)
SODIUM: 135 mmol/L (ref 135–146)

## 2015-09-17 ENCOUNTER — Telehealth: Payer: Self-pay | Admitting: *Deleted

## 2015-09-17 NOTE — Telephone Encounter (Signed)
Pt notified of lab results by phone with vebral understanding.

## 2015-09-24 ENCOUNTER — Telehealth (HOSPITAL_COMMUNITY): Payer: Self-pay | Admitting: *Deleted

## 2015-09-24 NOTE — Telephone Encounter (Signed)
Left message on voicemail per DPR in reference to upcoming appointment scheduled on 09/28/15 at 2:30 with detailed instructions given per Stress Test Requisition Sheet for the test. LM to arrive 30 minutes early, and that it is imperative to arrive on time for appointment to keep from having the test rescheduled. If you need to cancel or reschedule your appointment, please call the office within 24 hours of your appointment. Failure to do so may result in a cancellation of your appointment, and a $50 no show fee. Phone number given for call back for any questions. Veronia Beets

## 2015-09-28 ENCOUNTER — Other Ambulatory Visit (HOSPITAL_COMMUNITY): Payer: Self-pay

## 2015-10-14 ENCOUNTER — Ambulatory Visit (HOSPITAL_COMMUNITY): Payer: PPO

## 2015-10-14 ENCOUNTER — Other Ambulatory Visit (HOSPITAL_COMMUNITY): Payer: Self-pay

## 2015-10-14 ENCOUNTER — Ambulatory Visit (HOSPITAL_COMMUNITY): Payer: PPO | Attending: Cardiovascular Disease

## 2015-10-14 ENCOUNTER — Encounter (INDEPENDENT_AMBULATORY_CARE_PROVIDER_SITE_OTHER): Payer: Self-pay

## 2015-10-14 DIAGNOSIS — R5383 Other fatigue: Secondary | ICD-10-CM | POA: Diagnosis not present

## 2015-10-14 DIAGNOSIS — I2510 Atherosclerotic heart disease of native coronary artery without angina pectoris: Secondary | ICD-10-CM | POA: Diagnosis not present

## 2015-10-14 DIAGNOSIS — E785 Hyperlipidemia, unspecified: Secondary | ICD-10-CM | POA: Diagnosis not present

## 2015-10-14 DIAGNOSIS — E039 Hypothyroidism, unspecified: Secondary | ICD-10-CM | POA: Diagnosis not present

## 2015-10-14 DIAGNOSIS — R5382 Chronic fatigue, unspecified: Secondary | ICD-10-CM | POA: Diagnosis not present

## 2015-10-14 DIAGNOSIS — R002 Palpitations: Secondary | ICD-10-CM

## 2015-10-14 DIAGNOSIS — I10 Essential (primary) hypertension: Secondary | ICD-10-CM | POA: Diagnosis not present

## 2015-10-15 ENCOUNTER — Encounter: Payer: Self-pay | Admitting: Physician Assistant

## 2015-10-15 ENCOUNTER — Telehealth: Payer: Self-pay | Admitting: *Deleted

## 2015-10-15 NOTE — Telephone Encounter (Signed)
Pt notified of stress echo results by phone with verbal understanding.

## 2015-10-25 ENCOUNTER — Telehealth: Payer: Self-pay | Admitting: Cardiology

## 2015-10-25 ENCOUNTER — Telehealth: Payer: Self-pay | Admitting: Physician Assistant

## 2015-10-25 NOTE — Telephone Encounter (Signed)
Pt advised.

## 2015-10-25 NOTE — Telephone Encounter (Signed)
Pt advised Dr Aundra Dubin said he thought it Biotin and Magnesium would be okay.

## 2015-10-25 NOTE — Telephone Encounter (Signed)
Theresa Levine is calling to find out if she can take over the counter supplements like Biotin and Magnesium , wants to be sure that will not have any effect on her heart medicine.

## 2015-10-25 NOTE — Telephone Encounter (Signed)
I think those would be ok.

## 2015-10-25 NOTE — Telephone Encounter (Signed)
I will forward to Dr McLean for review 

## 2015-11-01 NOTE — Telephone Encounter (Signed)
Closed encounter °

## 2015-11-04 ENCOUNTER — Other Ambulatory Visit: Payer: Self-pay | Admitting: Cardiology

## 2015-11-09 ENCOUNTER — Telehealth: Payer: Self-pay | Admitting: Cardiology

## 2015-11-09 DIAGNOSIS — L638 Other alopecia areata: Secondary | ICD-10-CM | POA: Diagnosis not present

## 2015-11-09 DIAGNOSIS — L821 Other seborrheic keratosis: Secondary | ICD-10-CM | POA: Diagnosis not present

## 2015-11-09 NOTE — Telephone Encounter (Signed)
New message       Pt c/o medication issue:  1. Name of Medication: losartan 2. How are you currently taking this medication (dosage and times per day)? 40mg  daily 3. Are you having a reaction (difficulty breathing--STAT)? no 4. What is your medication issue? Since increased dosage, pt states her hair has been falling out. Could it be from the increase dosage in losartan?  Pt is going to see the dermatologist today at 2:30

## 2015-11-09 NOTE — Telephone Encounter (Signed)
Spoke with pt and informed her of what Dr. Aundra Dubin said.  Pt was seen by Dermatologist today and was told the two spots on her head were Alopecia.  Pt would like to try the cream prescribed today and see how things go before lowering her Metoprolol.  Advised pt to call our office if she does decide to lower her Metoprolol so we can update her chart.  Pt verbalized understanding and was very appreciative for assistance.

## 2015-11-09 NOTE — Telephone Encounter (Signed)
Can lower Toprol XL back to prior dose of 100 mg daily. Looks like T[place]ky had increased it to 150 mg daily.

## 2015-11-09 NOTE — Telephone Encounter (Signed)
Pt states that Losartan and Metoprolol were increased about the same time in mid June.  Since increases in meds pt has noticed hair loss which has worsened in the last month.  Spoke with Pharmacist, Jinny Blossom, and she states Metoprolol is probably the cause vs the Losartan.  Pt is going to see Dermatologist today at 2:10PM to make sure there is not another issue that could be causing this.  Pt wants message sent to Dr. Aundra Dubin for review to see if he could adjust meds back to the old dose or change meds that could help prevent some of this hair loss.  Advised pt that I would route message and a triage nurse would call her back with recommendations.

## 2015-11-17 ENCOUNTER — Encounter: Payer: Self-pay | Admitting: Cardiology

## 2015-12-01 ENCOUNTER — Ambulatory Visit (INDEPENDENT_AMBULATORY_CARE_PROVIDER_SITE_OTHER): Payer: PPO | Admitting: Cardiology

## 2015-12-01 VITALS — BP 124/82 | HR 80 | Ht 66.0 in | Wt 192.0 lb

## 2015-12-01 DIAGNOSIS — I10 Essential (primary) hypertension: Secondary | ICD-10-CM

## 2015-12-01 DIAGNOSIS — I471 Supraventricular tachycardia: Secondary | ICD-10-CM

## 2015-12-01 DIAGNOSIS — E785 Hyperlipidemia, unspecified: Secondary | ICD-10-CM

## 2015-12-01 DIAGNOSIS — Z720 Tobacco use: Secondary | ICD-10-CM

## 2015-12-01 DIAGNOSIS — R002 Palpitations: Secondary | ICD-10-CM | POA: Diagnosis not present

## 2015-12-01 NOTE — Patient Instructions (Signed)
Medication Instructions:  Your physician recommends that you continue on your current medications as directed. Please refer to the Current Medication list given to you today.   Labwork: None   Testing/Procedures: none  Follow-Up: Your physician wants you to follow-up in: 1 year with Margaret Pyle. (September 2018).  You will receive a reminder letter in the mail two months in advance. If you don't receive a letter, please call our office to schedule the follow-up appointment.        If you need a refill on your cardiac medications before your next appointment, please call your pharmacy.

## 2015-12-02 ENCOUNTER — Encounter: Payer: Self-pay | Admitting: Cardiology

## 2015-12-02 NOTE — Progress Notes (Signed)
Patient ID: Theresa Levine, female   DOB: 1950-11-22, 65 y.o.   MRN: [id number] PCP: Deatra Ina  65 yo with history of SVT, hypertension, and hyperlipidemia presents for cardiology followup.  She had a normal stress echo in 8/17.  She has been stable recently.  Rare atypical chest pain.  No significant exertional dyspnea.  She was on Crestor but developed myalgias and memory difficulties that resolved off Crestor.  Still smoking.  No tachypalpitations.  Labs (4/10): TG 129, HDL 44, LDL 188  Labs (6/10): TSH normal  Labs (1/11): LDL 175, HDL 42  Labs (2/11): K 4.1, creatinine 0.9  Labs (5/12): LDL 146, HDL 45 Labs (12/13): K 3.8, creatinine 0.8 Labs (7/17): K 4.2, creatinine 0.92  Allergies (verified):  No Known Drug Allergies   Past Medical History:  1. Supraventricular tachycardia. The patient had an episode of SVT in September 2009. Her heart rate was reported to be around 240 by EMS, However, there were no strips to evaluate. It did terminate with adenosine. 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 The patient did have an event monitor done in October 2009 that showed only normal sinus rhythm.  2. Left heart catheterization in September 2009. This showed only luminal irregularities. EF was 60%. Transthoracic echocardiogram also in September 2009 showed an EF of 55-60% with mild mitral regurgitation.  Echo (6/17) with EF 55-60%, ?basal inferior hypokinesis, normal RV size and systolic function.  Given wall motion abnormality on 6/17 echo, she had stress echo in 8/17 that was normal.  3. History of breast cancer. The patient did have a mastectomy in 1997. She had Adriamycin chemotherapy in 1998. She had radiation to her left chest in 2008.  4. Hypothyroidism.  5. Gastroesophageal reflux disease.  6. Anxiety.  7. Tobacco abuse.  8. HYPERTENSION (ICD-401.9): has not tolerated lisinopril or amlodipine.  Hyponatremia on chlorthalidone.  9. PALPITATIONS (ICD-785.1)  10. Hyperlipidemia: Unable to  tolerate Crestor, Lipitor, or pravastatin.  11. Depression.   Family History:  The patient's father had a myocardial infarction at the age of 38. She had a sister who had a pacemaker put in after an episode of syncope and she has a brother who died of esophageal cancer.   Social History:  The patient is a divorced Occupational hygienist. She does smoke a pack a day, has done so far about 40 years. She drinks minimal alcohol.   ROS: All systems reviewed and negative except as per HPI.   Current Outpatient Prescriptions  Medication Sig Dispense Refill  . aspirin 81 MG tablet Take 81 mg by mouth every other day.     . esomeprazole (NEXIUM) 40 MG packet Take 40 mg by mouth daily.      Marland Kitchen levothyroxine (LEVOTHROID) 25 MCG tablet Take 25 mcg by mouth daily.      Marland Kitchen losartan (COZAAR) 50 MG tablet Take 1 tablet (50 mg total) by mouth daily. 90 tablet 3  . meclizine (ANTIVERT) 25 MG tablet Take one every 8 hours as needed for dizziness/vertigo 30 tablet 0  . metoprolol succinate (TOPROL-XL) 100 MG 24 hr tablet TAKE  A TABLET AND HALF ONCE A DAY 135 tablet 5   No current facility-administered medications for this visit.     BP 124/82   Pulse 80   Ht 5\' 6"  (1.676 m)   Wt 192 lb (87.1 kg)   BMI 30.99 kg/m  General: NAD, overweight.  Neck: No JVD, no thyromegaly or thyroid nodule.  Lungs: Clear to auscultation bilaterally  with normal respiratory effort. CV: Nondisplaced PMI.  Heart regular S1/S2, no S3/S4, no murmur.  No peripheral edema.  No carotid bruit.  Normal pedal pulses.  Abdomen: Soft, nontender, no hepatosplenomegaly, no distention.  Neurologic: Alert and oriented x 3.  Psych: Normal affect. Extremities: No clubbing or cyanosis.   Assessment/Plan: 1. HTN: Good BP control on losartan and Toprol XL.    2. SVT: No tachypalpitations.  Continue Toprol XL.  3. Hyperlipidemia: She has not tolerated statins.  We discussed ezetimibe today, but she wants to hold off on other medications for the  time being.   4. Smoking: I strongly counseled her to quit.    Loralie Champagne 12/02/2015

## 2016-01-11 DIAGNOSIS — L821 Other seborrheic keratosis: Secondary | ICD-10-CM | POA: Diagnosis not present

## 2016-01-11 DIAGNOSIS — L638 Other alopecia areata: Secondary | ICD-10-CM | POA: Diagnosis not present

## 2016-01-22 DIAGNOSIS — J209 Acute bronchitis, unspecified: Secondary | ICD-10-CM | POA: Diagnosis not present

## 2016-01-22 DIAGNOSIS — J0111 Acute recurrent frontal sinusitis: Secondary | ICD-10-CM | POA: Diagnosis not present

## 2016-02-01 ENCOUNTER — Telehealth: Payer: Self-pay | Admitting: Cardiology

## 2016-02-01 DIAGNOSIS — E785 Hyperlipidemia, unspecified: Secondary | ICD-10-CM

## 2016-02-01 DIAGNOSIS — I2510 Atherosclerotic heart disease of native coronary artery without angina pectoris: Secondary | ICD-10-CM

## 2016-02-01 MED ORDER — EZETIMIBE-SIMVASTATIN 10-10 MG PO TABS: 1.0000 | ORAL_TABLET | ORAL | 11 refills | Status: DC

## 2016-02-01 NOTE — Telephone Encounter (Signed)
Vytorin has Zetia (Ezetimibe) and Zocor (Simvastatin) in it. Has she every had a problem with Zocor (Simvastatin)? If no and she is willing to try, she can take Vytorin 10/10 mg QD. Would rec she start Vytorin 3 days a week (Mon, Wed, Fri) to start. If she tolerates we can always increase to Once daily  Arrange fasting lipids and LFTs 3 mos after starting Vytorin. Richardson Dopp, PA-C   02/01/2016 2:57 PM

## 2016-02-01 NOTE — Telephone Encounter (Signed)
Pt wants to talk to Dr. Aundra Dubin or Richardson Dopp about a Cholesterol med, Vytorin , not for Cholesterol  per say hers in a little high and other meds "don't agree with her" but for hair growth, was told by Dermatologist it may work for her bald spots--pls advise-uses [facility] on [address]

## 2016-02-01 NOTE — Telephone Encounter (Signed)
I s/w pt in regards to Vytorin that she s/w her Dermatologist said could help with hair growth to help cover some of her bald spots. Pt states that she has not tried Simvastatin the past. Pt states is willing to try the Vytorin 10/10 mg one time a day every Mon, Wed and Fri's,. Pt aware if she does fine with this dose then she may increase to Vytoirn 10/10 to a daily basis. LFT/FLP 3/1. Pt has given verbal understanding to plan of care and all instructions.

## 2016-05-04 ENCOUNTER — Encounter (INDEPENDENT_AMBULATORY_CARE_PROVIDER_SITE_OTHER): Payer: Self-pay

## 2016-05-04 ENCOUNTER — Other Ambulatory Visit: Payer: PPO | Admitting: *Deleted

## 2016-05-04 DIAGNOSIS — I2510 Atherosclerotic heart disease of native coronary artery without angina pectoris: Secondary | ICD-10-CM

## 2016-05-04 DIAGNOSIS — E785 Hyperlipidemia, unspecified: Secondary | ICD-10-CM | POA: Diagnosis not present

## 2016-05-04 LAB — HEPATIC FUNCTION PANEL
ALBUMIN: 4.3 g/dL (ref 3.6–4.8)
ALT: 22 IU/L (ref 0–32)
AST: 23 IU/L (ref 0–40)
Alkaline Phosphatase: 77 IU/L (ref 39–117)
BILIRUBIN TOTAL: 0.5 mg/dL (ref 0.0–1.2)
BILIRUBIN, DIRECT: 0.17 mg/dL (ref 0.00–0.40)
Total Protein: 6.7 g/dL (ref 6.0–8.5)

## 2016-05-04 LAB — LIPID PANEL
CHOL/HDL RATIO: 5.3 ratio — AB (ref 0.0–4.4)
Cholesterol, Total: 223 mg/dL — ABNORMAL HIGH (ref 100–199)
HDL: 42 mg/dL (ref >39)
LDL CALC: 146 mg/dL — AB (ref 0–99)
Triglycerides: 176 mg/dL — ABNORMAL HIGH (ref 0–149)
VLDL Cholesterol Cal: 35 mg/dL (ref 5–40)

## 2016-05-04 NOTE — Addendum Note (Signed)
Addended by: Eulis Foster on: 05/04/2016 08:03 AM   Modules accepted: Orders

## 2016-05-05 ENCOUNTER — Telehealth: Payer: Self-pay | Admitting: *Deleted

## 2016-05-05 DIAGNOSIS — Z79899 Other long term (current) drug therapy: Secondary | ICD-10-CM

## 2016-05-05 NOTE — Telephone Encounter (Signed)
Lmtcb to go over lab results and recommendations to start Zetia and repeat labs in 3 months.

## 2016-05-08 MED ORDER — EZETIMIBE 10 MG PO TABS: 10.0000 mg | ORAL_TABLET | Freq: Every day | ORAL | 3 refills | Status: DC

## 2016-05-08 NOTE — Telephone Encounter (Signed)
F.u message ° °Pt returning RN call about lab work. Please call back to discuss  °

## 2016-05-08 NOTE — Telephone Encounter (Signed)
Notes Recorded by Liliane Shi, PA-C on 05/05/2016 at 1:34 PM EST Please call Theresa Levine with the results of her lab work: Cholesterol numbers are much higher than 1 year ago. Would recommend Ezetimibe 10 mg QD if she is willing with repeat Lipids and LFTs in 3 mos.  Patient aware of results. Sent Ezetimibe 10 mg by mouth daily to pharmacy of patient's choice. Patient will come in June for lab work. Patient stated she was not going to take vytorin due to cost, but will try the zetia. Updated patient's medication list while on phone with patient. Informed patient to be fasting for lab work in June. Patient verbalized understanding.

## 2016-05-10 ENCOUNTER — Telehealth: Payer: Self-pay | Admitting: *Deleted

## 2016-05-10 NOTE — Telephone Encounter (Signed)
Lmtcb x 2 to go over lab results and recommendations to start Zetia.

## 2016-05-30 ENCOUNTER — Encounter: Payer: Self-pay | Admitting: *Deleted

## 2016-05-30 DIAGNOSIS — L638 Other alopecia areata: Secondary | ICD-10-CM | POA: Diagnosis not present

## 2016-06-05 ENCOUNTER — Encounter: Payer: Self-pay | Admitting: *Deleted

## 2016-06-05 NOTE — Telephone Encounter (Signed)
Lmtcb x 3 to go over lab results and recommendations. I will send out letter to pt today to call back for lab results.

## 2016-06-06 ENCOUNTER — Telehealth: Payer: Self-pay | Admitting: Physician Assistant

## 2016-06-06 NOTE — Telephone Encounter (Signed)
Ptcb this morning about her lab results from March. I s/w pt who informs me that she s/w someone from our office on 05/08/16 about lab results and recommendations to start Zetia 10 mg daily. I apologized to the pt for my calling her as well. I explained that I had left a message on both 3/2 and 3/7. I did not see a phone note for 05/08/16 that pt said she s/w our office. I then looked a pt's labs and saw a future lab placed on 05/08/16 by Howie Ill, RN. Nothing was noted on the results note of the call from 05/08/16. I also apologized to the pt that a letter was mailed yesterday for her to call for her results. Pt said this was fine and there was no problem. Pt does states to me though that she was glad that I had called because she wanted to let Nicki Reaper W.PA know that she had started the [place] in March, however started noticing problems with her esophagus feeling tight and weak. Pt states she then started taking Zetia every other day, pt then stopped Zetia all together at this time. 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 Pt was originally given an Rx for Nexium 40 mg daily, though the cost was too much she now takes Nexium 20 mg , 2 tabs daily to still = 40 mg daily. I told pt that she should not take the Zetia until further advised by Brynda Rim. PA. I explained Brynda Rim. PA is out of the office until Friday 4/6 and I will d/w him as to our conversation today for further recommendations. I did tell pt that let's keep her on the lab schedule for 08/08/16 for FLP/LFT. Pt is agreeable to this plan of care. Pt thanked me for my time and my call.

## 2016-06-06 NOTE — Telephone Encounter (Signed)
Ptcb this morning about her lab results from March. I s/w pt who informs me that she s/w someone from our office on 05/08/16 about lab results and recommendations to start Zetia 10 mg daily. I apologized to the pt for my calling her as well. I explained that I had left a message on both 3/2 and 3/7. I did not see a phone note for 05/08/16 that pt said she s/w our office. I then looked a pt's labs and saw a future lab placed on 05/08/16 by Howie Ill, RN. Nothing was noted on the results note of the call from 05/08/16. I also apologized to the pt that a letter was mailed yesterday for her to call for her results. Pt said this was fine and there was no problem. Pt does states to me though that she was glad that I had called because she wanted to let Nicki Reaper W.PA know that she had started the [place] in March, however started noticing problems with her esophagus feeling tight and weak. Pt states she then started taking Zetia every other day, pt then stopped Zetia all together at this time. 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 Pt was originally given an Rx for Nexium 40 mg daily, though the cost was too much she now takes Nexium 20 mg , 2 tabs daily to still = 40 mg daily. I told pt that she should not take the Zetia until further advised by Brynda Rim. PA. I explained Brynda Rim. PA is out of the office until Friday 4/6 and I will d/w him as to our conversation today for further recommendations. I did tell pt that let's keep her on the lab schedule for 08/08/16 for FLP/LFT. Pt is agreeable to this plan of care. Pt thanked me for my time and my call.

## 2016-06-06 NOTE — Telephone Encounter (Signed)
If swallowing symptoms resolved off of Zetia, remain off and list Zetia in allergies. If symptoms did not resolve off of Zetia, ok to resume and patient needs follow up with PCP to evaluate. Richardson Dopp, PA-C   06/06/2016 4:51 PM

## 2016-06-06 NOTE — Telephone Encounter (Signed)
Lmom per Brynda Rim. PA to remain off Zetia since her  symptoms are better off the Zetia. F/u with PCP for further evaluation. If any questions please call [phone].

## 2016-06-06 NOTE — Telephone Encounter (Signed)
New Message ° °Pt returning nurses call °

## 2016-06-06 NOTE — Addendum Note (Signed)
Addended by: Michae Kava on: 06/06/2016 05:17 PM   Modules accepted: Orders

## 2016-08-08 ENCOUNTER — Other Ambulatory Visit: Payer: Self-pay

## 2016-09-13 ENCOUNTER — Telehealth: Payer: Self-pay | Admitting: Pharmacist

## 2016-09-13 NOTE — Telephone Encounter (Signed)
Pt called clinic with reports of diffuse body aches. She stopped Zetia 4 months ago and her symptoms had resolved. She has felt fine until 3 weeks ago when she developed body aches. She states this occurred last year when she took Crestor as well. Advised her that the Zetia has been out of her system for months and that her symptoms are not likely related to cholesterol medication since she d/ced her medication 4 months ago and symptoms presented 3 weeks ago. Advised her she needs to call her PCP to be evaluated for other sources of her pain.

## 2016-09-15 ENCOUNTER — Other Ambulatory Visit: Payer: Self-pay | Admitting: Physician Assistant

## 2016-09-15 DIAGNOSIS — I10 Essential (primary) hypertension: Secondary | ICD-10-CM

## 2016-10-25 DIAGNOSIS — M539 Dorsopathy, unspecified: Secondary | ICD-10-CM | POA: Diagnosis not present

## 2016-10-25 DIAGNOSIS — R52 Pain, unspecified: Secondary | ICD-10-CM | POA: Diagnosis not present

## 2016-11-16 ENCOUNTER — Other Ambulatory Visit: Payer: Self-pay | Admitting: Interventional Cardiology

## 2016-11-16 DIAGNOSIS — I10 Essential (primary) hypertension: Secondary | ICD-10-CM

## 2016-12-05 ENCOUNTER — Ambulatory Visit (INDEPENDENT_AMBULATORY_CARE_PROVIDER_SITE_OTHER): Payer: PPO | Admitting: Physician Assistant

## 2016-12-05 ENCOUNTER — Encounter: Payer: Self-pay | Admitting: Physician Assistant

## 2016-12-05 VITALS — BP 138/62 | HR 74 | Ht 66.0 in | Wt 180.8 lb

## 2016-12-05 DIAGNOSIS — E785 Hyperlipidemia, unspecified: Secondary | ICD-10-CM | POA: Diagnosis not present

## 2016-12-05 DIAGNOSIS — I471 Supraventricular tachycardia: Secondary | ICD-10-CM | POA: Diagnosis not present

## 2016-12-05 DIAGNOSIS — M5432 Sciatica, left side: Secondary | ICD-10-CM

## 2016-12-05 DIAGNOSIS — I10 Essential (primary) hypertension: Secondary | ICD-10-CM

## 2016-12-05 MED ORDER — LOSARTAN POTASSIUM 50 MG PO TABS: 50.0000 mg | ORAL_TABLET | Freq: Every day | ORAL | 3 refills | Status: DC

## 2016-12-05 NOTE — Patient Instructions (Addendum)
Medication Instructions:  1. REFILL HAS BEEN SENT IN FOR LOSARTAN  Labwork: NONE ORDERED TODAY  Testing/Procedures: NONE ORDERED TODAY  Follow-Up: Your physician wants you to follow-up in: [place], [facility]  You will receive a reminder letter in the mail two months in advance. If you don't receive a letter, please call our office to schedule the follow-up appointment.   Any Other Special Instructions Will Be Listed Below (If Applicable).     If you need a refill on your cardiac medications before your next appointment, please call your pharmacy.

## 2016-12-05 NOTE — Progress Notes (Signed)
Cardiology Office Note:    Date:  12/05/2016   ID:  Theresa Levine, DOB March 16, 1950, MRN [id number]  PCP:  Theresa Levine., PA-C  Cardiologist:  Dr. Loralie Levine >> Dr. Kristen Cardinal, PA-C    Referring MD: Theresa Levine., PA-C   Chief Complaint  Patient presents with  . Follow-up    SVT, HTN    History of Present Illness:    Theresa Levine is a 66 y.o. female with a hx of SVT, HTN, HL (intol of statins and ezetimibe).  She had an episode of SVT in 2009 that converted to NSR with Adenosine.  HR was reportedly 240 but no strips were available for review.  An echocardiogram in 6/17 demonstrated questionable inferior hypokinesis. Exercise treadmill test was negative for ischemic changes on ECG but she had poor exercise capacity. Therefore, an exercise echocardiogram was performed. This was normal. Last seen by Dr. Aundra Levine 9/17.  Ms. Theresa Levine returns for follow up.  Unfortunately, she is having significant pain in her left leg secondary to sciatica. She had muscle weakness and stopped taking Zetia. She really prefers not to try any other cholesterol therapy. She has not had chest discomfort, dyspnea, syncope, paroxysmal nocturnal dyspnea or edema. She had some palpitations a few weeks ago. However, this was fairly brief and she has not had a recurrence.  Prior CV studies:   The following studies were reviewed today:  ETT-echocardiogram 10/14/15 No ischemic ECG changes, no echocardiographic evidence for stress-induced ischemia  Exercise treadmill test 08/24/15 No ischemic ECG changes, poor exercise capacity, hypertensive response to stress  Echocardiogram 08/24/15 EF 55-60, inferior HK, grade 1 diastolic dysfunction, GLS -18.8%, trivial MR, normal RVSF, PASP 17  LHC (9/09): LAD and circumflex with irregularities and ectasia, proximal and mid RCA 30%, EF 60%.  Echo (9/09):  EF 55-65%, mild MR  Past Medical History:  Diagnosis Date  . Anxiety   . Breast  cancer ([place])   . Depression   . FHx: chemotherapy 1998   Radiation left in chest in 2008  . Gastroesophageal reflux disease   . History of percutaneous left heart catheterization September 2009   This showed only luminal irregularities, EF was 60%   . History of stress test    ETT 6/17: Poor Ex capacity, Ex 3' (4.6 METs), no ischemic ST changes, hypertensive BP response // ETT-Echo 8/17: normal  . Hyperlipidemia    unable to tolerate Crestor, Lipitor, or pravastatin  . Hypertension   . Hypothyroidism   . Mild mitral regurgitation by prior echocardiogram September 2009   Showed an EF of 55-60%  . Palpitations   . Supraventricular tachycardia ([place])    The patient had an episode of SVT in September 2009. Her heart rate was reported  to be around 240 by EMS., However, there were no strips to evaluate. It did terminate with  adenosine. 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 The patient did have an event monitor done in October 2009 that showed only normal sinus rhythm.   . Tobacco user   1. Supraventricular tachycardia. The patient had an episode of SVT in September 2009. Her heart rate was reported to be around 240 by EMS, However, there were no strips to evaluate. It did terminate with adenosine. 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 The patient did have an event monitor done in October 2009 that showed only normal sinus rhythm.  2. Left heart catheterization in September 2009. This showed only luminal irregularities. EF was  60%. Transthoracic echocardiogram also in September 2009 showed an EF of 55-60% with mild mitral regurgitation.  3. History of breast cancer. The patient did have a mastectomy in 1997. She had Adriamycin chemotherapy in 1998. She had radiation to her left chest in 2008.  4. Hypothyroidism.  5. Gastroesophageal reflux disease.  6. Anxiety.  7. Tobacco abuse.  8. HYPERTENSION (ICD-401.9): has not tolerated lisinopril or amlodipine. Hyponatremia on chlorthalidone.  9. PALPITATIONS  (ICD-785.1)  10. Hyperlipidemia: Unable to tolerate Crestor, Lipitor, or pravastatin.  11. Depression.  Past Surgical History:  Procedure Laterality Date  . CARDIAC CATHETERIZATION  12/02/2006   Eustace Quail, MD. Lumpectomy and radical nide dissection of the left breast  . MASTECTOMY  1997    Current Medications: Current Meds  Medication Sig  . acetaminophen (TYLENOL) 500 MG tablet Take 500 mg by mouth every 4 (four) hours as needed for moderate pain.  Marland Kitchen ALPRAZolam (XANAX) 0.25 MG tablet Take 0.25 mg by mouth at bedtime as needed for anxiety.  Marland Kitchen aspirin 81 MG tablet Take 81 mg by mouth daily.  Marland Kitchen esomeprazole (NEXIUM) 40 MG packet Take 40 mg by mouth daily.    Marland Kitchen levothyroxine (LEVOTHROID) 25 MCG tablet Take 25 mcg by mouth daily.    . meclizine (ANTIVERT) 25 MG tablet Take one every 8 hours as needed for dizziness/vertigo  . metoprolol succinate (TOPROL-XL) 100 MG 24 hr tablet Take 100 mg by mouth daily. Take with or immediately following a meal.     Allergies:   Crestor [rosuvastatin calcium]; Ezetimibe; Rosuvastatin calcium; Sulfa antibiotics; and Sulfacetamide sodium   Social History   Social History  . Marital status: Widowed    Spouse name: N/A  . Number of children: N/A  . Years of education: N/A   Occupational History  . Occupational hygienist    Social History Main Topics  . Smoking status: Current Every Day Smoker    Packs/day: 1.00    Years: 40.00    Types: Cigarettes  . Smokeless tobacco: Never Used  . Alcohol use 0.0 oz/week     Comment: Minimal  . Drug use: No  . Sexual activity: Not Asked   Other Topics Concern  . None   Social History Narrative  . None     Family Hx: The patient's family history includes Esophageal cancer in her brother; Fainting in her sister; Heart attack (age of onset: 48) in her father; Stroke in her paternal grandfather.  ROS:   Please see the history of present illness.    ROS All other systems reviewed and are  negative.   EKGs/Labs/Other Test Reviewed:    EKG:  EKG is  ordered today.  The ekg ordered today demonstrates NSR, HR 74, low voltage, QTc 444 ms, no change from prior tracing  Recent Labs: 05/04/2016: ALT 22   Recent Lipid Panel Lab Results  Component Value Date/Time   CHOL 223 (H) 05/04/2016 08:03 AM   TRIG 176 (H) 05/04/2016 08:03 AM   HDL 42 05/04/2016 08:03 AM   CHOLHDL 5.3 (H) 05/04/2016 08:03 AM   CHOLHDL 3.3 05/11/2015 02:38 PM   LDLCALC 146 (H) 05/04/2016 08:03 AM   LDLDIRECT 143.9 01/21/2014 04:38 PM    Physical Exam:    VS:  BP 138/62   Pulse 74   Ht 5\' 6"  (1.676 m)   Wt 180 lb 12.8 oz (82 kg)   BMI 29.18 kg/m     Wt Readings from Last 3 Encounters:  12/05/16 180 lb 12.8 oz (  82 kg)  12/01/15 192 lb (87.1 kg)  08/16/15 192 lb 6.4 oz (87.3 kg)     Physical Exam  Constitutional: She is oriented to person, place, and time. She appears well-developed and well-nourished. No distress.  HENT:  Head: Normocephalic and atraumatic.  Eyes: No scleral icterus.  Neck: No JVD present.  Cardiovascular: Normal rate and regular rhythm.   No murmur heard. Pulmonary/Chest: Effort normal. She has no rales.  Abdominal: Soft.  Musculoskeletal: She exhibits no edema.  Neurological: She is alert and oriented to person, place, and time.  Skin: Skin is warm and dry.  Psychiatric: She has a normal mood and affect.    ASSESSMENT:    1. PSVT (paroxysmal supraventricular tachycardia) ([place])   2. Essential hypertension   3. Hyperlipidemia, unspecified hyperlipidemia type   4. Sciatica of left side    PLAN:    In order of problems listed above:  1. PSVT (paroxysmal supraventricular tachycardia) ([place]) Prior history of SVT in 2009 that converted to sinus rhythm with adenosine via EMS. She has mainly been controlled on beta blocker therapy. She had a brief episode of palpitations recently. However, these are fairly infrequent. Continue current dose of metoprolol succinate. If  palpitations become more frequent, consider event monitor.  2. Essential hypertension The patient's blood pressure is controlled on her current regimen.  Continue current therapy.    3. Hyperlipidemia, unspecified hyperlipidemia type Intolerant of statins and ezetimibe.  She does not want to not try alternative agents such as PCSK-9 inhibitors.  4. L Sciatica I have asked her to follow up with primary care to have further workup.  Dispo:  Return in about 1 year (around 12/05/2017) for Routine Follow Up, w/ Richardson Dopp, PA-C.   Medication Adjustments/Labs and Tests Ordered: Current medicines are reviewed at length with the patient today.  Concerns regarding medicines are outlined above.  Tests Ordered: Orders Placed This Encounter  Procedures  . EKG 12-Lead   Medication Changes: Meds ordered this encounter  Medications  . losartan (COZAAR) 50 MG tablet    Sig: Take 1 tablet (50 mg total) by mouth daily.    Dispense:  90 tablet    Refill:  3    Signed, Richardson Dopp, PA-C  12/05/2016 4:31 PM    [facility] [place], [place], [place]  [address] Phone: [phone]; Fax: [phone]

## 2016-12-19 ENCOUNTER — Other Ambulatory Visit: Payer: Self-pay | Admitting: Physician Assistant

## 2016-12-20 ENCOUNTER — Other Ambulatory Visit: Payer: Self-pay | Admitting: *Deleted

## 2016-12-20 MED ORDER — METOPROLOL SUCCINATE ER 100 MG PO TB24: 100.0000 mg | ORAL_TABLET | Freq: Every day | ORAL | 3 refills | Status: DC

## 2016-12-20 NOTE — Telephone Encounter (Signed)
New Message   *STAT* If patient is at the pharmacy, call can be transferred to refill team.   1. Which medications need to be refilled? (please list name of each medication and dose if known) Metoprolol 100mg    2. Which pharmacy/location (including street and city if local pharmacy) is medication to be sent to? [facility]  3. Do they need a 30 day or 90 day supply? [place]

## 2016-12-26 DIAGNOSIS — M545 Low back pain: Secondary | ICD-10-CM | POA: Diagnosis not present

## 2016-12-26 DIAGNOSIS — M7541 Impingement syndrome of right shoulder: Secondary | ICD-10-CM | POA: Diagnosis not present

## 2016-12-26 DIAGNOSIS — M48062 Spinal stenosis, lumbar region with neurogenic claudication: Secondary | ICD-10-CM | POA: Diagnosis not present

## 2016-12-26 DIAGNOSIS — M5432 Sciatica, left side: Secondary | ICD-10-CM | POA: Diagnosis not present

## 2016-12-29 ENCOUNTER — Telehealth: Payer: Self-pay | Admitting: Physician Assistant

## 2016-12-29 NOTE — Telephone Encounter (Signed)
New message    Pt isc alling asking for a call back. She said she just has a question and will ask the nurse.

## 2017-01-01 NOTE — Telephone Encounter (Signed)
TENS unit should be safe.  Make sure patient knows to not put it on her chest or trunk. Richardson Dopp, PA-C    01/01/2017 9:52 PM

## 2017-01-01 NOTE — Telephone Encounter (Signed)
Pt states she has Sciatica and states her sister advised to see if she can use a TENS MACHINE to help with her Sciatica. Pt has hx of PSVT and wants to make sure ok with her heart to use TENS MACHINE. Advised I will d/w PA and call back later with recommendations. Pt thanked me for my call.

## 2017-01-02 NOTE — Telephone Encounter (Signed)
Lmtcb so that I may go over recommendation per Brynda Rim. PA about TENS UNIT.

## 2017-01-03 DIAGNOSIS — M545 Low back pain: Secondary | ICD-10-CM | POA: Diagnosis not present

## 2017-01-03 NOTE — Telephone Encounter (Signed)
Tried to return pt's call back. I lmom I will call her back later this afternoon.

## 2017-01-03 NOTE — Telephone Encounter (Signed)
Lmtcb x 2 ok to use TENS UNIT.

## 2017-01-03 NOTE — Telephone Encounter (Signed)
F/u message ° °Pt returning RN call .please call back to discuss  °

## 2017-01-05 NOTE — Telephone Encounter (Signed)
Pt has been notified today ok per Richardson Dopp, PA to use TENS UNIT. Pt advised to NOT place TENS UNIT on her chest or trunk area, ok to use below hip. Pt thanked me for my call.

## 2017-01-08 ENCOUNTER — Other Ambulatory Visit (HOSPITAL_COMMUNITY): Payer: Self-pay | Admitting: Specialist

## 2017-01-08 DIAGNOSIS — C50919 Malignant neoplasm of unspecified site of unspecified female breast: Secondary | ICD-10-CM

## 2017-01-09 ENCOUNTER — Other Ambulatory Visit (HOSPITAL_COMMUNITY): Payer: Self-pay | Admitting: Specialist

## 2017-01-09 ENCOUNTER — Ambulatory Visit (HOSPITAL_COMMUNITY)
Admission: RE | Admit: 2017-01-09 | Discharge: 2017-01-09 | Disposition: A | Discharge status: Home / Self Care | Payer: PPO | Source: Ambulatory Visit | Attending: Specialist | Admitting: Specialist

## 2017-01-09 ENCOUNTER — Ambulatory Visit
Admission: RE | Admit: 2017-01-09 | Discharge: 2017-01-09 | Disposition: A | Discharge status: Home / Self Care | Payer: Self-pay | Source: Ambulatory Visit | Attending: Specialist | Admitting: Specialist

## 2017-01-09 DIAGNOSIS — I10 Essential (primary) hypertension: Secondary | ICD-10-CM | POA: Diagnosis present

## 2017-01-09 DIAGNOSIS — I959 Hypotension, unspecified: Secondary | ICD-10-CM | POA: Diagnosis not present

## 2017-01-09 DIAGNOSIS — M7541 Impingement syndrome of right shoulder: Secondary | ICD-10-CM | POA: Diagnosis present

## 2017-01-09 DIAGNOSIS — C801 Malignant (primary) neoplasm, unspecified: Secondary | ICD-10-CM

## 2017-01-09 DIAGNOSIS — I700 Atherosclerosis of aorta: Secondary | ICD-10-CM | POA: Insufficient documentation

## 2017-01-09 DIAGNOSIS — Z171 Estrogen receptor negative status [ER-]: Secondary | ICD-10-CM | POA: Diagnosis not present

## 2017-01-09 DIAGNOSIS — M545 Low back pain: Secondary | ICD-10-CM | POA: Diagnosis not present

## 2017-01-09 DIAGNOSIS — C50919 Malignant neoplasm of unspecified site of unspecified female breast: Secondary | ICD-10-CM

## 2017-01-09 DIAGNOSIS — Z853 Personal history of malignant neoplasm of breast: Secondary | ICD-10-CM | POA: Diagnosis not present

## 2017-01-09 DIAGNOSIS — F17210 Nicotine dependence, cigarettes, uncomplicated: Secondary | ICD-10-CM | POA: Diagnosis present

## 2017-01-09 DIAGNOSIS — M5432 Sciatica, left side: Secondary | ICD-10-CM | POA: Diagnosis present

## 2017-01-09 DIAGNOSIS — R102 Pelvic and perineal pain: Secondary | ICD-10-CM | POA: Diagnosis not present

## 2017-01-09 DIAGNOSIS — F419 Anxiety disorder, unspecified: Secondary | ICD-10-CM | POA: Diagnosis present

## 2017-01-09 DIAGNOSIS — E871 Hypo-osmolality and hyponatremia: Secondary | ICD-10-CM | POA: Diagnosis present

## 2017-01-09 DIAGNOSIS — C50912 Malignant neoplasm of unspecified site of left female breast: Secondary | ICD-10-CM | POA: Diagnosis not present

## 2017-01-09 DIAGNOSIS — G893 Neoplasm related pain (acute) (chronic): Secondary | ICD-10-CM | POA: Diagnosis not present

## 2017-01-09 DIAGNOSIS — M48061 Spinal stenosis, lumbar region without neurogenic claudication: Secondary | ICD-10-CM | POA: Diagnosis not present

## 2017-01-09 DIAGNOSIS — Z7189 Other specified counseling: Secondary | ICD-10-CM | POA: Diagnosis not present

## 2017-01-09 DIAGNOSIS — N179 Acute kidney failure, unspecified: Secondary | ICD-10-CM | POA: Diagnosis not present

## 2017-01-09 DIAGNOSIS — M5410 Radiculopathy, site unspecified: Secondary | ICD-10-CM | POA: Diagnosis present

## 2017-01-09 DIAGNOSIS — I340 Nonrheumatic mitral (valve) insufficiency: Secondary | ICD-10-CM | POA: Diagnosis present

## 2017-01-09 DIAGNOSIS — M5442 Lumbago with sciatica, left side: Secondary | ICD-10-CM

## 2017-01-09 DIAGNOSIS — C7951 Secondary malignant neoplasm of bone: Secondary | ICD-10-CM

## 2017-01-09 DIAGNOSIS — M549 Dorsalgia, unspecified: Secondary | ICD-10-CM | POA: Diagnosis present

## 2017-01-09 DIAGNOSIS — B961 Klebsiella pneumoniae [K. pneumoniae] as the cause of diseases classified elsewhere: Secondary | ICD-10-CM | POA: Diagnosis present

## 2017-01-09 DIAGNOSIS — R402413 Glasgow coma scale score 13-15, at hospital admission: Secondary | ICD-10-CM | POA: Diagnosis present

## 2017-01-09 DIAGNOSIS — M84452A Pathological fracture, left femur, initial encounter for fracture: Secondary | ICD-10-CM | POA: Diagnosis present

## 2017-01-09 DIAGNOSIS — N390 Urinary tract infection, site not specified: Secondary | ICD-10-CM | POA: Diagnosis present

## 2017-01-09 DIAGNOSIS — R52 Pain, unspecified: Secondary | ICD-10-CM | POA: Diagnosis not present

## 2017-01-09 DIAGNOSIS — M899 Disorder of bone, unspecified: Secondary | ICD-10-CM

## 2017-01-09 DIAGNOSIS — M48062 Spinal stenosis, lumbar region with neurogenic claudication: Secondary | ICD-10-CM | POA: Diagnosis present

## 2017-01-09 DIAGNOSIS — R7881 Bacteremia: Secondary | ICD-10-CM | POA: Diagnosis not present

## 2017-01-09 DIAGNOSIS — M25552 Pain in left hip: Secondary | ICD-10-CM | POA: Diagnosis present

## 2017-01-09 DIAGNOSIS — M84652A Pathological fracture in other disease, left femur, initial encounter for fracture: Secondary | ICD-10-CM | POA: Diagnosis not present

## 2017-01-09 DIAGNOSIS — I2510 Atherosclerotic heart disease of native coronary artery without angina pectoris: Secondary | ICD-10-CM | POA: Diagnosis present

## 2017-01-09 DIAGNOSIS — C799 Secondary malignant neoplasm of unspecified site: Secondary | ICD-10-CM | POA: Diagnosis not present

## 2017-01-09 DIAGNOSIS — E039 Hypothyroidism, unspecified: Secondary | ICD-10-CM | POA: Diagnosis present

## 2017-01-09 DIAGNOSIS — R590 Localized enlarged lymph nodes: Secondary | ICD-10-CM | POA: Insufficient documentation

## 2017-01-09 DIAGNOSIS — M25551 Pain in right hip: Secondary | ICD-10-CM | POA: Diagnosis present

## 2017-01-09 DIAGNOSIS — K219 Gastro-esophageal reflux disease without esophagitis: Secondary | ICD-10-CM | POA: Diagnosis present

## 2017-01-09 DIAGNOSIS — M79652 Pain in left thigh: Secondary | ICD-10-CM | POA: Diagnosis not present

## 2017-01-09 DIAGNOSIS — M7989 Other specified soft tissue disorders: Secondary | ICD-10-CM | POA: Diagnosis not present

## 2017-01-09 DIAGNOSIS — Z515 Encounter for palliative care: Secondary | ICD-10-CM | POA: Diagnosis present

## 2017-01-09 DIAGNOSIS — M898X8 Other specified disorders of bone, other site: Secondary | ICD-10-CM | POA: Diagnosis not present

## 2017-01-09 LAB — POCT I-STAT CREATININE: Creatinine, Ser: 0.7 mg/dL (ref 0.44–1.00)

## 2017-01-09 LAB — GLUCOSE, CAPILLARY: GLUCOSE-CAPILLARY: 81 mg/dL (ref 65–99)

## 2017-01-09 MED ORDER — IOPAMIDOL (ISOVUE-300) INJECTION 61%
75.0000 mL | Freq: Once | INTRAVENOUS | Status: AC | PRN
  Administered 2017-01-09: 75 mL via INTRAVENOUS

## 2017-01-09 MED ORDER — FLUDEOXYGLUCOSE F - 18 (FDG) INJECTION
9.3700 | Freq: Once | INTRAVENOUS | Status: AC | PRN
  Administered 2017-01-09: 9.37 via INTRAVENOUS

## 2017-01-09 MED ORDER — IOPAMIDOL (ISOVUE-300) INJECTION 61%
INTRAVENOUS | Status: AC
  Filled 2017-01-09: qty 75

## 2017-01-10 ENCOUNTER — Telehealth: Payer: Self-pay | Admitting: Physician Assistant

## 2017-01-10 NOTE — Telephone Encounter (Signed)
Went to see Dr. Tonita Cong at [facility]. XRAY was done Breast cancer cells were see in that xray Had MRI, CT and PET at [facility]. Dr. Tonita Cong called her this am. She wanted to pass this information along to Richardson Dopp, APP to keep him in the loop.

## 2017-01-10 NOTE — Telephone Encounter (Signed)
New Message  Pt call requesting to speak with RN. Pt states she would like to discuss what's she has been discussing with her orthopedic provider. Please call back to discuss

## 2017-01-10 NOTE — Telephone Encounter (Signed)
Ok.  Thank you. Richardson Dopp, PA-C    01/10/2017 4:41 PM

## 2017-01-12 ENCOUNTER — Inpatient Hospital Stay (HOSPITAL_COMMUNITY)
Admission: AD | Admit: 2017-01-12 | Discharge: 2017-01-20 | DRG: 478 | Disposition: A | Discharge status: Skilled Nursing Facility | Payer: PPO | Source: Ambulatory Visit | Attending: Internal Medicine | Admitting: Internal Medicine

## 2017-01-12 ENCOUNTER — Encounter (HOSPITAL_COMMUNITY): Payer: Self-pay

## 2017-01-12 ENCOUNTER — Encounter: Payer: Self-pay | Admitting: Family Medicine

## 2017-01-12 DIAGNOSIS — M5432 Sciatica, left side: Secondary | ICD-10-CM | POA: Diagnosis present

## 2017-01-12 DIAGNOSIS — E871 Hypo-osmolality and hyponatremia: Secondary | ICD-10-CM | POA: Diagnosis present

## 2017-01-12 DIAGNOSIS — I10 Essential (primary) hypertension: Secondary | ICD-10-CM | POA: Diagnosis present

## 2017-01-12 DIAGNOSIS — Z515 Encounter for palliative care: Secondary | ICD-10-CM | POA: Diagnosis present

## 2017-01-12 DIAGNOSIS — C7951 Secondary malignant neoplasm of bone: Secondary | ICD-10-CM | POA: Diagnosis present

## 2017-01-12 DIAGNOSIS — N179 Acute kidney failure, unspecified: Secondary | ICD-10-CM | POA: Diagnosis not present

## 2017-01-12 DIAGNOSIS — Z8679 Personal history of other diseases of the circulatory system: Secondary | ICD-10-CM

## 2017-01-12 DIAGNOSIS — C801 Malignant (primary) neoplasm, unspecified: Secondary | ICD-10-CM

## 2017-01-12 DIAGNOSIS — M5410 Radiculopathy, site unspecified: Secondary | ICD-10-CM | POA: Diagnosis present

## 2017-01-12 DIAGNOSIS — K219 Gastro-esophageal reflux disease without esophagitis: Secondary | ICD-10-CM | POA: Diagnosis present

## 2017-01-12 DIAGNOSIS — R52 Pain, unspecified: Secondary | ICD-10-CM | POA: Diagnosis present

## 2017-01-12 DIAGNOSIS — M25552 Pain in left hip: Secondary | ICD-10-CM | POA: Diagnosis present

## 2017-01-12 DIAGNOSIS — M549 Dorsalgia, unspecified: Secondary | ICD-10-CM | POA: Diagnosis present

## 2017-01-12 DIAGNOSIS — M48062 Spinal stenosis, lumbar region with neurogenic claudication: Secondary | ICD-10-CM | POA: Diagnosis present

## 2017-01-12 DIAGNOSIS — Z7982 Long term (current) use of aspirin: Secondary | ICD-10-CM

## 2017-01-12 DIAGNOSIS — C50912 Malignant neoplasm of unspecified site of left female breast: Secondary | ICD-10-CM | POA: Diagnosis not present

## 2017-01-12 DIAGNOSIS — E039 Hypothyroidism, unspecified: Secondary | ICD-10-CM | POA: Diagnosis present

## 2017-01-12 DIAGNOSIS — C799 Secondary malignant neoplasm of unspecified site: Secondary | ICD-10-CM | POA: Diagnosis not present

## 2017-01-12 DIAGNOSIS — R600 Localized edema: Secondary | ICD-10-CM | POA: Diagnosis not present

## 2017-01-12 DIAGNOSIS — Z171 Estrogen receptor negative status [ER-]: Secondary | ICD-10-CM | POA: Diagnosis not present

## 2017-01-12 DIAGNOSIS — Z9012 Acquired absence of left breast and nipple: Secondary | ICD-10-CM

## 2017-01-12 DIAGNOSIS — M25551 Pain in right hip: Secondary | ICD-10-CM | POA: Diagnosis present

## 2017-01-12 DIAGNOSIS — M545 Low back pain: Secondary | ICD-10-CM | POA: Diagnosis not present

## 2017-01-12 DIAGNOSIS — I959 Hypotension, unspecified: Secondary | ICD-10-CM | POA: Diagnosis not present

## 2017-01-12 DIAGNOSIS — Z79899 Other long term (current) drug therapy: Secondary | ICD-10-CM

## 2017-01-12 DIAGNOSIS — F329 Major depressive disorder, single episode, unspecified: Secondary | ICD-10-CM | POA: Diagnosis present

## 2017-01-12 DIAGNOSIS — M7541 Impingement syndrome of right shoulder: Secondary | ICD-10-CM | POA: Diagnosis present

## 2017-01-12 DIAGNOSIS — I2510 Atherosclerotic heart disease of native coronary artery without angina pectoris: Secondary | ICD-10-CM | POA: Diagnosis present

## 2017-01-12 DIAGNOSIS — R402413 Glasgow coma scale score 13-15, at hospital admission: Secondary | ICD-10-CM | POA: Diagnosis present

## 2017-01-12 DIAGNOSIS — F419 Anxiety disorder, unspecified: Secondary | ICD-10-CM | POA: Diagnosis present

## 2017-01-12 DIAGNOSIS — M899 Disorder of bone, unspecified: Secondary | ICD-10-CM

## 2017-01-12 DIAGNOSIS — Z853 Personal history of malignant neoplasm of breast: Secondary | ICD-10-CM | POA: Diagnosis not present

## 2017-01-12 DIAGNOSIS — M84452A Pathological fracture, left femur, initial encounter for fracture: Secondary | ICD-10-CM | POA: Diagnosis present

## 2017-01-12 DIAGNOSIS — R7881 Bacteremia: Secondary | ICD-10-CM | POA: Diagnosis not present

## 2017-01-12 DIAGNOSIS — B961 Klebsiella pneumoniae [K. pneumoniae] as the cause of diseases classified elsewhere: Secondary | ICD-10-CM | POA: Diagnosis present

## 2017-01-12 DIAGNOSIS — Z7189 Other specified counseling: Secondary | ICD-10-CM | POA: Diagnosis not present

## 2017-01-12 DIAGNOSIS — M7989 Other specified soft tissue disorders: Secondary | ICD-10-CM | POA: Diagnosis not present

## 2017-01-12 DIAGNOSIS — Z882 Allergy status to sulfonamides status: Secondary | ICD-10-CM

## 2017-01-12 DIAGNOSIS — N390 Urinary tract infection, site not specified: Secondary | ICD-10-CM | POA: Diagnosis present

## 2017-01-12 DIAGNOSIS — I340 Nonrheumatic mitral (valve) insufficiency: Secondary | ICD-10-CM | POA: Diagnosis present

## 2017-01-12 DIAGNOSIS — I252 Old myocardial infarction: Secondary | ICD-10-CM

## 2017-01-12 DIAGNOSIS — R001 Bradycardia, unspecified: Secondary | ICD-10-CM | POA: Diagnosis not present

## 2017-01-12 DIAGNOSIS — G893 Neoplasm related pain (acute) (chronic): Secondary | ICD-10-CM | POA: Diagnosis not present

## 2017-01-12 DIAGNOSIS — F17210 Nicotine dependence, cigarettes, uncomplicated: Secondary | ICD-10-CM | POA: Diagnosis present

## 2017-01-12 DIAGNOSIS — C50919 Malignant neoplasm of unspecified site of unspecified female breast: Secondary | ICD-10-CM | POA: Diagnosis present

## 2017-01-12 DIAGNOSIS — Z923 Personal history of irradiation: Secondary | ICD-10-CM

## 2017-01-12 DIAGNOSIS — Z95828 Presence of other vascular implants and grafts: Secondary | ICD-10-CM

## 2017-01-12 DIAGNOSIS — Z888 Allergy status to other drugs, medicaments and biological substances status: Secondary | ICD-10-CM

## 2017-01-12 DIAGNOSIS — Z9221 Personal history of antineoplastic chemotherapy: Secondary | ICD-10-CM

## 2017-01-12 LAB — CBC
HEMATOCRIT: 39.1 % (ref 36.0–46.0)
HEMOGLOBIN: 14 g/dL (ref 12.0–15.0)
MCH: 30.5 pg (ref 26.0–34.0)
MCHC: 35.8 g/dL (ref 30.0–36.0)
MCV: 85.2 fL (ref 78.0–100.0)
Platelets: 204 10*3/uL (ref 150–400)
RBC: 4.59 MIL/uL (ref 3.87–5.11)
RDW: 15.3 % (ref 11.5–15.5)
WBC: 7.2 10*3/uL (ref 4.0–10.5)

## 2017-01-12 LAB — BASIC METABOLIC PANEL
ANION GAP: 13 (ref 5–15)
BUN: 11 mg/dL (ref 6–20)
CO2: 23 mmol/L (ref 22–32)
Calcium: 10.1 mg/dL (ref 8.9–10.3)
Chloride: 93 mmol/L — ABNORMAL LOW (ref 101–111)
Creatinine, Ser: 0.75 mg/dL (ref 0.44–1.00)
GFR calc non Af Amer: >60 mL/min (ref >60)
Glucose, Bld: 114 mg/dL — ABNORMAL HIGH (ref 65–99)
Potassium: 4 mmol/L (ref 3.5–5.1)
SODIUM: 129 mmol/L — AB (ref 135–145)

## 2017-01-12 MED ORDER — METOPROLOL SUCCINATE ER 50 MG PO TB24
100.0000 mg | ORAL_TABLET | Freq: Every day | ORAL | Status: DC
  Administered 2017-01-12 – 2017-01-18 (×6): 100 mg via ORAL
  Filled 2017-01-12 (×8): qty 2

## 2017-01-12 MED ORDER — ENOXAPARIN SODIUM 40 MG/0.4ML ~~LOC~~ SOLN
40.0000 mg | SUBCUTANEOUS | Status: DC
  Administered 2017-01-12 – 2017-01-13 (×2): 40 mg via SUBCUTANEOUS
  Filled 2017-01-12 (×2): qty 0.4

## 2017-01-12 MED ORDER — OXYCODONE HCL 5 MG PO TABS
5.0000 mg | ORAL_TABLET | ORAL | Status: DC | PRN
  Administered 2017-01-12 – 2017-01-20 (×19): 5 mg via ORAL
  Filled 2017-01-12 (×20): qty 1

## 2017-01-12 MED ORDER — LEVOTHYROXINE SODIUM 25 MCG PO TABS
25.0000 ug | ORAL_TABLET | Freq: Every day | ORAL | Status: DC
  Administered 2017-01-13 – 2017-01-15 (×3): 25 ug via ORAL
  Filled 2017-01-12 (×3): qty 1

## 2017-01-12 MED ORDER — MECLIZINE HCL 25 MG PO TABS
25.0000 mg | ORAL_TABLET | Freq: Two times a day (BID) | ORAL | Status: DC | PRN
  Administered 2017-01-13: 25 mg via ORAL
  Filled 2017-01-12: qty 1

## 2017-01-12 MED ORDER — ALPRAZOLAM 0.25 MG PO TABS
0.2500 mg | ORAL_TABLET | Freq: Every evening | ORAL | Status: DC | PRN
  Administered 2017-01-13 – 2017-01-16 (×3): 0.25 mg via ORAL
  Filled 2017-01-12 (×3): qty 1

## 2017-01-12 MED ORDER — NICOTINE 21 MG/24HR TD PT24
21.0000 mg | MEDICATED_PATCH | Freq: Every day | TRANSDERMAL | Status: DC
  Administered 2017-01-12 – 2017-01-20 (×9): 21 mg via TRANSDERMAL
  Filled 2017-01-12 (×9): qty 1

## 2017-01-12 MED ORDER — LOSARTAN POTASSIUM 50 MG PO TABS
50.0000 mg | ORAL_TABLET | Freq: Every day | ORAL | Status: DC
  Administered 2017-01-13: 50 mg via ORAL
  Filled 2017-01-12 (×2): qty 1

## 2017-01-12 MED ORDER — PANTOPRAZOLE SODIUM 40 MG PO PACK
40.0000 mg | PACK | Freq: Every day | ORAL | Status: DC
  Administered 2017-01-13 – 2017-01-20 (×8): 40 mg via ORAL
  Filled 2017-01-12 (×8): qty 20

## 2017-01-12 MED ORDER — HYDROMORPHONE HCL 1 MG/ML IJ SOLN
1.0000 mg | INTRAMUSCULAR | Status: DC | PRN
  Administered 2017-01-12 – 2017-01-14 (×5): 1 mg via INTRAVENOUS
  Filled 2017-01-12 (×6): qty 1

## 2017-01-12 MED ORDER — ASPIRIN EC 81 MG PO TBEC
81.0000 mg | DELAYED_RELEASE_TABLET | Freq: Every day | ORAL | Status: DC
  Administered 2017-01-13 – 2017-01-20 (×7): 81 mg via ORAL
  Filled 2017-01-12 (×7): qty 1

## 2017-01-12 MED ORDER — ESOMEPRAZOLE MAGNESIUM 40 MG PO PACK: 40.0000 mg | PACK | Freq: Every day | ORAL | Status: DC

## 2017-01-12 MED ORDER — KETOROLAC TROMETHAMINE 30 MG/ML IJ SOLN
30.0000 mg | Freq: Four times a day (QID) | INTRAMUSCULAR | Status: DC | PRN
  Administered 2017-01-12 – 2017-01-14 (×2): 30 mg via INTRAVENOUS
  Filled 2017-01-12 (×2): qty 1

## 2017-01-12 MED ORDER — ENSURE ENLIVE PO LIQD
237.0000 mL | Freq: Two times a day (BID) | ORAL | Status: DC
  Administered 2017-01-13 – 2017-01-20 (×7): 237 mL via ORAL

## 2017-01-12 NOTE — H&P (Signed)
History and Physical    Dayla Gasca [facility] XNT:[id number] DOB: 1950-06-26 DOA: 01/12/2017  Referring MD/NP/PA: Direct admission from Dr. Rolena Infante office   PCP: Aletha Halim., PA-C   Patient coming from: Home   Chief Complaint: right hip pain  HPI: Jahzaria Vary is a 66 y.o. female with medical history significant of breast cancer (received radiation to the left side of the chest in 2008), SVT, HTN, HLD, has been seen in ortho specialist office Dr. Rolena Infante for evaluation of right hip pain that has been getting progressively worse over the past week. Patient describes pain as constant and sharp, worse with weight bearing and associated with occasional cramping, numbness in the right hip area. Patient reports trying vicodin but it has not helped with pain. Patient was found to have metastatic lesion in the left femur with impeding fracture. Plan was for oncology to see prior to deciding on surgery.   Review of Systems:  Constitutional: Negative for fever, chills, diaphoresis, activity change, appetite change and fatigue.  HENT: Negative for ear pain, nosebleeds, congestion, facial swelling, rhinorrhea, neck pain, neck stiffness and ear discharge.   Eyes: Negative for pain, discharge, redness, itching and visual disturbance.  Respiratory: Negative for cough, choking, chest tightness, shortness of breath, wheezing and stridor.   Cardiovascular: Negative for chest pain, palpitations and leg swelling.  Gastrointestinal: Negative for abdominal distention.  Genitourinary: Negative for dysuria, urgency, frequency, hematuria, flank pain, decreased urine volume, difficulty urinating and dyspareunia.  Musculoskeletal: Negative for arthralgias Neurological: Negative for dizziness, tremors, seizures, syncope, facial asymmetry, speech difficulty, weakness, light-headedness, numbness and headaches.  Hematological: Negative for adenopathy. Does not bruise/bleed easily.  Psychiatric/Behavioral: Negative  for hallucinations, behavioral problems, confusion, dysphoric mood, decreased concentration and agitation.   Past Medical History:  Diagnosis Date  . Anxiety   . Breast cancer ([place])   . Depression   . FHx: chemotherapy 1998   Radiation left in chest in 2008  . Gastroesophageal reflux disease   . History of percutaneous left heart catheterization September 2009   This showed only luminal irregularities, EF was 60%   . History of stress test    ETT 6/17: Poor Ex capacity, Ex 3' (4.6 METs), no ischemic ST changes, hypertensive BP response // ETT-Echo 8/17: normal  . Hyperlipidemia    unable to tolerate Crestor, Lipitor, or pravastatin  . Hypertension   . Hypothyroidism   . Mild mitral regurgitation by prior echocardiogram September 2009   Showed an EF of 55-60%  . Palpitations   . Supraventricular tachycardia ([place])    The patient had an episode of SVT in September 2009. Her heart rate was reported  to be around 240 by EMS., However, there were no strips to evaluate. It did terminate with  adenosine. 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 The patient did have an event monitor done in October 2009 that showed only normal sinus rhythm.   . Tobacco user     Past Surgical History:  Procedure Laterality Date  . CARDIAC CATHETERIZATION  12/02/2006   Eustace Quail, MD. Lumpectomy and radical nide dissection of the left breast  . MASTECTOMY  1997   Social Hx:  reports that she has been smoking cigarettes.  She has a 40.00 pack-year smoking history. she has never used smokeless tobacco. She reports that she drinks alcohol. She reports that she does not use drugs.  Allergies  Allergen Reactions  . Crestor [Rosuvastatin Calcium] Other (See Comments)    Myalgias  . Ezetimibe  Other reaction(s): GI Upset (intolerance)  . Rosuvastatin Calcium     Other reaction(s): Other (See Comments) Myalgias  . Sulfa Antibiotics   . Sulfacetamide Sodium     Other reaction(s): Other (See Comments)     Family History  Problem Relation Age of Onset  . Heart attack Father 71  . Esophageal cancer Brother   . Fainting Sister        Had a pacemaker put in  . Stroke Paternal Grandfather     Medication Sig  acetaminophen (TYLENOL) 500 MG tablet Take 500 mg by mouth every 4 (four) hours as needed for moderate pain.  ALPRAZolam (XANAX) 0.25 MG tablet Take 0.25 mg by mouth at bedtime as needed for anxiety.  aspirin 81 MG tablet Take 81 mg by mouth daily.  esomeprazole (NEXIUM) 40 MG packet Take 40 mg by mouth daily.    levothyroxine (LEVOTHROID) 25 MCG tablet Take 25 mcg by mouth daily.    losartan (COZAAR) 50 MG tablet Take 1 tablet (50 mg total) by mouth daily.  meclizine (ANTIVERT) 25 MG tablet Take one every 8 hours as needed for dizziness/vertigo  metoprolol succinate (TOPROL-XL) 100 MG 24 hr tablet Take 1 tablet (100 mg total) by mouth daily. Take with or immediately following a meal.    Physical Exam: There were no vitals filed for this visit.  Constitutional: NAD, calm, comfortable There were no vitals filed for this visit. Eyes: PERRL, lids and conjunctivae normal ENMT: Mucous membranes are moist. Posterior pharynx clear of any exudate or lesions.Normal dentition.  Neck: normal, supple, no masses, no thyromegaly Respiratory: clear to auscultation bilaterally, no wheezing, no crackles. Normal respiratory effort. No accessory muscle use.  Cardiovascular: Regular rate and rhythm, no murmurs / rubs / gallops. No extremity edema. 2+ pedal pulses. No carotid bruits.  Abdomen: no tenderness, no masses palpated. No hepatosplenomegaly. Bowel sounds positive.  Musculoskeletal: no clubbing / cyanosis. No joint deformity upper and lower extremities. Good ROM, no contractures. Normal muscle tone.  Skin: no rashes, lesions, ulcers. No induration Neurologic: CN 2-12 grossly intact. Sensation intact, DTR normal. Strength 5/5 in all 4.  Psychiatric: Normal judgment and insight. Alert and  oriented x 3. Normal mood.    Labs on Admission: I have personally reviewed following labs and imaging studies  CBC: No results for input(s): WBC, NEUTROABS, HGB, HCT, MCV, PLT in the last 168 hours. Basic Metabolic Panel: Recent Labs  Lab 01/09/17 1350  CREATININE 0.70   GFR: CrCl cannot be calculated (Unknown ideal weight.). Liver Function Tests: No results for input(s): AST, ALT, ALKPHOS, BILITOT, PROT, ALBUMIN in the last 168 hours. No results for input(s): LIPASE, AMYLASE in the last 168 hours. No results for input(s): AMMONIA in the last 168 hours. Coagulation Profile: No results for input(s): INR, PROTIME in the last 168 hours. Cardiac Enzymes: No results for input(s): CKTOTAL, CKMB, CKMBINDEX, TROPONINI in the last 168 hours. BNP (last 3 results) No results for input(s): PROBNP in the last 8760 hours. HbA1C: No results for input(s): HGBA1C in the last 72 hours. CBG: Recent Labs  Lab 01/09/17 1332  GLUCAP 81   Lipid Profile: No results for input(s): CHOL, HDL, LDLCALC, TRIG, CHOLHDL, LDLDIRECT in the last 72 hours. Thyroid Function Tests: No results for input(s): TSH, T4TOTAL, FREET4, T3FREE, THYROIDAB in the last 72 hours. Anemia Panel: No results for input(s): VITAMINB12, FOLATE, FERRITIN, TIBC, IRON, RETICCTPCT in the last 72 hours. Urine analysis: No results found for: COLORURINE, APPEARANCEUR, [place], [place], [place], [place], [place],  KETONESUR, PROTEINUR, UROBILINOGEN, NITRITE, LEUKOCYTESUR Sepsis Labs: @LABRCNTIP (procalcitonin:4,lacticidven:4) )No results found for this or any previous visit (from the past 240 hour(s)).   Radiological Exams on Admission: CT lumbar spine 11/06  Metastatic disease to the LEFT L4 vertebral body extending into the pedicle, transverse process, and posterior elements is likely contributory to the patient's LEFT-sided sciatica. LEFT-sided tumor extending into the foramen and extraforaminal soft tissues could affect the  LEFT L3, LEFT L4, and LEFT L5 nerve roots.   PET scan 11/06  CHEST: 6 mm short axis right supraclavicular lymph node and is hypermetabolic with SUV max = 8.5. There is a tiny markedly hypermetabolic right subpectoral lymph node seen on image 40 series for an measuring about 7 mm. Emphysema noted in the lungs bilaterally. Subpleural reticulation anterior left upper lobe suggest prior radiation.  7 mm short axis inferior right hilar lymph node (image 56 series 4) is hypermetabolic with SUV max = 3.9.  ABDOMEN/PELVIS: 2.3 cm right adrenal nodule is markedly hypermetabolic with SUV max = 17.4. Irregular air 3.9 x 3.1 cm left adrenal mass is hypermetabolic with SUV max = 08.1. 10 mm short axis left periaortic lymph node seen on image 102 of series 4 is hypermetabolic with SUV max = 14 point knee.  SKELETON: Large destructive lesion left aspect of the L4 vertebral body destroys the pedicle and transverse process. Tumor extension into the lateral aspect of the spinal canal at this level is evident. Lesion is markedly hypermetabolic with SUV max = 44.8. A large destructive sternal lesion measuring 4.6 x 9.4 demonstrates SUV max = 23.9. Hypermetabolic metastatic lesion is seen in the posterior left fourth rib. Incompletely visualized hypermetabolic lesion is noted in the distal left femur. Metastatic lesion noted left superior acetabulum. Uptake in the left adductor musculature of the thigh and maybe movement related, but metastatic involvement is not excluded.  IMPRESSION: 1. Tiny hypermetabolic lymph nodes in the right supraclavicular region, right subpectoral space, inferior right hilum, and abdominal para-aortic retroperitoneal space. 2. Large hypermetabolic bone lesions in the sternum, L3 vertebral body, left acetabulum, and distal left femur with other smaller scattered hypermetabolic bone metastases evident. 3. Hypermetabolism in the adductor musculature of the proximal  left thigh, possibly movement related but metastatic disease is a concern.   EKG: not done  Assessment/Plan Principal Problem:   Bone lesions highly suspicious for metastatic lesions in pt with known hx of breast cancer  - recent imaging studies notable for large lesion in the left acetabulum and distal left femur with ? Impeding fracture  - will need to consult with oncologist in AM - for now provide analgesia as needed - may also need ortho consult based on what oncologist recommends  - no weight bearing for now     HTN - continue home medical regimen losartan and metoprolol    Hypothyroidism - continue synthroid      Smoker - provide nicotine patch    Will need to obtain blood work as no current data available.   DVT prophylaxis: Lovenox SQ Code Status: Full  Family Communication: Pt and daughter updated at bedside Disposition Plan: Inpatient  Consults called: None  Admission status: Inpatient   Faye Ramsay MD [facility] Pager [phone]  If 7PM-7AM, please contact night-coverage [email or web address] Password [facility]  01/12/2017, 5:39 PM

## 2017-01-12 NOTE — Consult Note (Signed)
History of Present Illness The patient is a 66 year old female who presents with back pain. The patient is here today in referral from Dr Tonita Cong. The patient reports low back symptoms including pain (left groin) and low back pain which began 4 month(s) ago in association with an established activity (picked up some water at the store and felt weakness in the arms). Symptoms are reported to be located in the low back and Symptoms include pain (upper back, low back and left leg), weakness (with turning in the back) and pain with coughing/sneezing, while symptoms do not include numbness, tingling, incontinence of stool or incontinence of urine. The patient describes the severity of their symptoms as 10 / 10 on an analog pain scale. The patient feels as if the symptoms are worsening (with weather today). Symptoms are exacerbated by standing. Symptoms are not relieved by activity modification, opioid analgesics or non-opioid analgesics. Current treatment includes non-opioid analgesics (Tylenol), opioid analgesics (Hydrocodone 5-325 mg) and activity modification. Pertinent medical history includes sciatica and anti-coagulants, while pertinent medical history does not include previous back surgery or pacemaker. Prior to being seen today the patient was previously evaluated Dr Tonita Cong. Past evaluation has included CT of the lumbar spine (@ [facility]) and MRI of the lumbar spine. Past treatment has included non-opioid analgesics (Tramadol and Tylenol).   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 Heart Disease Father. Rheumatoid Arthritis Mother, Sister. 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 12/26/2016: smoke(d) 1 pack(s) per day  Medication History  ALPRAZolam (0.25MG  Tablet, Oral) Active. Norco (5-325MG  Tablet, 1 (one) Tablet Oral every 6-8 hours as needed for pain, Taken starting 01/05/2017) Inactive. Losartan Potassium (50MG  Tablet, Oral) Active. Metoprolol Succinate ER (100MG  Tablet ER 24HR, Oral) Active.  Past Surgical History  Breast Mass; Local Excision left Colon Polyp Removal - Colonoscopy  Other Problems ([facility]; 01/12/2017 1:05 PM) Anxiety Disorder Breast Cancer Coronary artery disease Gastroesophageal Reflux Disease Hyperthyroidism Myocardial infarction  Note:Clinical exam. She is alert and oriented 3. She has significant back buttock and left leg pain. She has diffuse weakness in the left side due to severe pain. Negative Babinski test, no clonus, negative Hoffmann sign. She has groin and left thigh pain with palpation and ambulation. No significant knee or ankle pain with joint range of motion. Intact peripheral pulses bilaterally. This of breath or chest pain. Abdomen soft and nontender no incontinence of bowel and bladder. No obvious skin lesions abrasions contusions noted to the spine.  multiple imaging studies reviewed. 1. PET scan performed January 09, 2017. Multiple large hypermetabolic bone lesions have been visualized. Sternum, L3 vertebral body, left acetabulum, and the left distal femur. Smaller scattered hypermetabolic bone metastasis are also seen. These include the right supraclavicular region. It should be noted that they identify the L3 vertebral body but I believe it is the L4 based on her other  imaging studies.  2. MRI of the lumbar spine March 05, 2017. Large aggressive osseous lesion within  the L4 vertebral body. Involving the left L4 pedicle posterior elements and vertebral body. Additional small lesions seen within the right pedicle and transverse process of L5 and possibly within the L2 vertebral body. Multilevel degenerative lumbar disease especially at L5-S1 with loss of normal disc space height. No significant canal stenosis.  3. CT scan of the lumbar spine January 09, 2017. Again osseous destruction of the L4 vertebral body. Extending into the left pedicle. Did feel to be consistent with metastatic breast cancer. Near complete destruction of the superior articular process and pars interarticularis on the left side at L4. Also tumor extending into the transverse process and paravertebral soft tissues. Again some multilevel degenerative changes are also noted on the CT scan.   4. Two-view femur views taken today. Left side. Mid femoral shaft lytic destruction is noted.  At this point time I have spoken directly with Dr. Tonita Cong and he is in agreement that admission to the hospital is warranted not only for possible IM nail fixation of an impending pathological femur fracture but also a full comprehensive oncology workup. Dr. Tonita Cong has spoken directly with my partner Dr. Lillia Corporal about possible surgery on the left femur. I will personally speak with the hospitalist service and try and do a direct admission. We will also consult the oncology team for comprehensive workup. With respect to the spine I am going to order a TLSO brace in order to provide stabilization while these other issues are addressed. Further recommendations pending workup.

## 2017-01-13 ENCOUNTER — Inpatient Hospital Stay (HOSPITAL_COMMUNITY): Payer: PPO

## 2017-01-13 DIAGNOSIS — M899 Disorder of bone, unspecified: Secondary | ICD-10-CM

## 2017-01-13 DIAGNOSIS — Z853 Personal history of malignant neoplasm of breast: Secondary | ICD-10-CM

## 2017-01-13 LAB — BASIC METABOLIC PANEL
Anion gap: 11 (ref 5–15)
BUN: 15 mg/dL (ref 6–20)
CALCIUM: 10 mg/dL (ref 8.9–10.3)
CHLORIDE: 93 mmol/L — AB (ref 101–111)
CO2: 25 mmol/L (ref 22–32)
CREATININE: 1.09 mg/dL — AB (ref 0.44–1.00)
GFR calc non Af Amer: 51 mL/min — ABNORMAL LOW (ref >60)
GFR, EST AFRICAN AMERICAN: 59 mL/min — AB (ref >60)
Glucose, Bld: 107 mg/dL — ABNORMAL HIGH (ref 65–99)
Potassium: 3.6 mmol/L (ref 3.5–5.1)
SODIUM: 129 mmol/L — AB (ref 135–145)

## 2017-01-13 LAB — CBC
HCT: 38.2 % (ref 36.0–46.0)
Hemoglobin: 13.4 g/dL (ref 12.0–15.0)
MCH: 30 pg (ref 26.0–34.0)
MCHC: 35.1 g/dL (ref 30.0–36.0)
MCV: 85.5 fL (ref 78.0–100.0)
PLATELETS: 199 10*3/uL (ref 150–400)
RBC: 4.47 MIL/uL (ref 3.87–5.11)
RDW: 15.5 % (ref 11.5–15.5)
WBC: 6.5 10*3/uL (ref 4.0–10.5)

## 2017-01-13 LAB — T4, FREE: Free T4: 0.96 ng/dL (ref 0.61–1.12)

## 2017-01-13 LAB — OSMOLALITY: Osmolality: 274 mOsm/kg — ABNORMAL LOW (ref 275–295)

## 2017-01-13 LAB — SODIUM, URINE, RANDOM: SODIUM UR: 30 mmol/L

## 2017-01-13 LAB — TSH: TSH: 8.829 u[IU]/mL — ABNORMAL HIGH (ref 0.350–4.500)

## 2017-01-13 LAB — OSMOLALITY, URINE: Osmolality, Ur: 374 mOsm/kg (ref 300–900)

## 2017-01-13 MED ORDER — ONDANSETRON HCL 4 MG/2ML IJ SOLN: 4.0000 mg | Freq: Four times a day (QID) | INTRAMUSCULAR | Status: DC

## 2017-01-13 MED ORDER — ONDANSETRON HCL 4 MG/2ML IJ SOLN: 4.0000 mg | INTRAMUSCULAR | Status: DC | PRN

## 2017-01-13 MED ORDER — ONDANSETRON HCL 4 MG/2ML IJ SOLN
4.0000 mg | INTRAMUSCULAR | Status: DC | PRN
Start: 2017-01-13 — End: 2017-01-20
  Administered 2017-01-13 – 2017-01-18 (×4): 4 mg via INTRAVENOUS
  Filled 2017-01-13 (×5): qty 2

## 2017-01-13 MED ORDER — ACETAMINOPHEN 500 MG PO TABS
1000.0000 mg | ORAL_TABLET | Freq: Three times a day (TID) | ORAL | Status: DC
  Administered 2017-01-13 – 2017-01-20 (×20): 1000 mg via ORAL
  Filled 2017-01-13 (×21): qty 2

## 2017-01-13 NOTE — Progress Notes (Signed)
Patient refusing low bed for fall risk with injury.  Pt educated about bed and remains adamant that she does not want this bed as it is uncomfortable for her.  Pt will remain in current bed, will continue to monitor.

## 2017-01-13 NOTE — Progress Notes (Signed)
Subjective:    Patient reports pain as moderate.  Low back and hip pain. Tolerating PO's. Denies Bowel or bladder changes. Denies CO,SOB,or calf pain.  Objective: Vital signs in last 24 hours: Temp:  [98 F (36.7 C)] 98 F (36.7 C) (11/10 0518) Pulse Rate:  [67-78] 67 (11/10 0518) Resp:  [16] 16 (11/10 0518) BP: (117-121)/(80) 121/80 (11/10 0518) SpO2:  [99 %] 99 % (11/10 0518)  Intake/Output from previous day: 11/09 0701 - 11/10 0700 In: 355 [P.O.:355] Out: -  Intake/Output this shift: No intake/output data recorded.  Recent Labs    01/12/17 1756 01/13/17 0436  HGB 14.0 13.4   Recent Labs    01/12/17 1756 01/13/17 0436  WBC 7.2 6.5  RBC 4.59 4.47  HCT 39.1 38.2  PLT 204 199   Recent Labs    01/12/17 1756 01/13/17 0436  NA 129* 129*  K 4.0 3.6  CL 93* 93*  CO2 23 25  BUN 11 15  CREATININE 0.75 1.09*  GLUCOSE 114* 107*  CALCIUM 10.1 10.0   No results for input(s): LABPT, INR in the last 72 hours.  Alert and oriented x3. RRR, Lungs clear, BS x4. Left Calf soft and non tender. Tender over left upper leg.  No DVT signs. No signs of infection or compartment syndrome. LLE grossly neurovascularly intact.   Assessment/Plan:    Left femoral lytic changes: Possible IM nail fixation  NWB LLE Dr.Swinteck to Evaluate  Lumbar pain: TLSO  Possible surgical intervention Dr.Broooks following  Oncology Consulted for comprehensive workup Continue current care   Theresa Levine L 01/13/2017, 8:32 AM

## 2017-01-13 NOTE — Consult Note (Signed)
Reason for Referral: Metastatic cancer to the bone.  HPI: 66 year old woman currently lives in the [place] area is done so the majority of her life.  She currently lives independently after retiring in 2016.  She has history of breast cancer dating back to 46.  At the time she had an abnormal mammogram and subsequently underwent a left lumpectomy followed by adjuvant chemotherapy in the form of CMF.  She also received adjuvant radiation therapy in 1998.  She followed by Dr. Benay Spice for about 5 years afterwards and has been disease free since that time.  Her last mammogram was about a year ago.  In the last 6 months she has noticed generalized weakness and subsequently started developing lower back pain and mostly left hip pain.  She was evaluated by Dr. Tonita Cong and had multiple imaging studies.  He had a MRI of the lumbar spine on January 03, 2017 showed a large osseous lesion on L4 vertebral body.  No evidence of cord compression at the time.  She subsequently underwent a PET CT scan on January 09, 2017 which showed multiple hypermetabolic bone lesions including the sternum, L3 vertebral body and the left acetabulum.  No visceral metastasis or clear-cut lymphadenopathy noted.  She was electively admitted on 01/12/2017 for further workup and evaluation.  She was also seen by Dr. Rolena Infante for evaluation regarding her lumbar spine lesion.  I was asked to evaluate her from an oncology standpoint.  Clinically, she reports her most concern and complaints are related to her left hip and left groin.  She is able to ambulate with some assistance and her pain is manageable with pain medication.  She does report right-sided shoulder pain and slight sternal discomfort.  She does not report any neuropathy or weakness.  She does not report any weight loss or appetite changes.  She does not report any alteration of her mental status or seizures.  She does not report any headaches, blurred vision or falls.  She does not  report any fevers or chills or sweats.  She does not report any chest pain, palpitation, orthopnea or leg edema.  She does not report any cough, wheezing or hemoptysis.  She does not report any nausea, vomiting or abdominal pain.  She does not report any frequency urgency or hesitancy.  She does not report lymphadenopathy or petechiae.  Remaining review of systems unremarkable.  Past Medical History:  Diagnosis Date  . Anxiety   . Breast cancer ([place])   . Depression   . FHx: chemotherapy 1998   Radiation left in chest in 2008  . Gastroesophageal reflux disease   . History of percutaneous left heart catheterization September 2009   This showed only luminal irregularities, EF was 60%   . History of stress test    ETT 6/17: Poor Ex capacity, Ex 3' (4.6 METs), no ischemic ST changes, hypertensive BP response // ETT-Echo 8/17: normal  . Hyperlipidemia    unable to tolerate Crestor, Lipitor, or pravastatin  . Hypertension   . Hypothyroidism   . Mild mitral regurgitation by prior echocardiogram September 2009   Showed an EF of 55-60%  . Palpitations   . Supraventricular tachycardia ([place])    The patient had an episode of SVT in September 2009. Her heart rate was reported  to be around 240 by EMS., However, there were no strips to evaluate. It did terminate with  adenosine. 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 The patient did have an event monitor done in October 2009  that showed only normal sinus rhythm.   . Tobacco user   :  Past Surgical History:  Procedure Laterality Date  . CARDIAC CATHETERIZATION  12/02/2006   Eustace Quail, MD. Lumpectomy and radical nide dissection of the left breast  . MASTECTOMY  1997  :   Current Facility-Administered Medications:  .  acetaminophen (TYLENOL) tablet 1,000 mg, 1,000 mg, Oral, Q8H, Powell, A Clint Lipps., MD .  ALPRAZolam Duanne Moron) tablet 0.25 mg, 0.25 mg, Oral, QHS PRN, Theodis Blaze, MD .  aspirin EC tablet 81 mg, 81 mg, Oral, Daily, Theodis Blaze,  MD, 81 mg at 01/13/17 1107 .  enoxaparin (LOVENOX) injection 40 mg, 40 mg, Subcutaneous, Q24H, Theodis Blaze, MD, 40 mg at 01/12/17 2134 .  feeding supplement (ENSURE ENLIVE) (ENSURE ENLIVE) liquid 237 mL, 237 mL, Oral, BID BM, Theodis Blaze, MD, 237 mL at 01/13/17 1107 .  HYDROmorphone (DILAUDID) injection 1 mg, 1 mg, Intravenous, Q2H PRN, Theodis Blaze, MD, 1 mg at 01/13/17 1108 .  ketorolac (TORADOL) 30 MG/ML injection 30 mg, 30 mg, Intravenous, Q6H PRN, Theodis Blaze, MD, 30 mg at 01/12/17 1749 .  levothyroxine (SYNTHROID, LEVOTHROID) tablet 25 mcg, 25 mcg, Oral, QAC breakfast, Theodis Blaze, MD, 25 mcg at 01/13/17 1107 .  losartan (COZAAR) tablet 50 mg, 50 mg, Oral, Daily, Theodis Blaze, MD, 50 mg at 01/13/17 1107 .  meclizine (ANTIVERT) tablet 25 mg, 25 mg, Oral, BID PRN, Theodis Blaze, MD, 25 mg at 01/13/17 0043 .  metoprolol succinate (TOPROL-XL) 24 hr tablet 100 mg, 100 mg, Oral, q1800, Theodis Blaze, MD, 100 mg at 01/12/17 2133 .  nicotine (NICODERM CQ - dosed in mg/24 hours) patch 21 mg, 21 mg, Transdermal, Daily, Theodis Blaze, MD, 21 mg at 01/13/17 1107 .  ondansetron (ZOFRAN) injection 4 mg, 4 mg, Intravenous, Q4H PRN, Rise Patience, MD, 4 mg at 01/13/17 1108 .  oxyCODONE (Oxy IR/ROXICODONE) immediate release tablet 5 mg, 5 mg, Oral, Q4H PRN, Theodis Blaze, MD, 5 mg at 01/12/17 1905 .  pantoprazole sodium (PROTONIX) 40 mg/20 mL oral suspension 40 mg, 40 mg, Oral, Daily, Theodis Blaze, MD, 40 mg at 01/13/17 1107:  Allergies  Allergen Reactions  . Crestor [Rosuvastatin Calcium] Other (See Comments)    Myalgias  . Ezetimibe     Other reaction(s): GI Upset (intolerance)  . Rosuvastatin Calcium     Other reaction(s): Other (See Comments) Myalgias  . Sulfa Antibiotics   . Sulfacetamide Sodium     Other reaction(s): Other (See Comments)  :  Family History  Problem Relation Age of Onset  . Heart attack Father 3  . Esophageal cancer Brother   . Fainting Sister         Had a pacemaker put in  . Stroke Paternal Grandfather   :  Social History   Socioeconomic History  . Marital status: Divorced    Spouse name: Not on file  . Number of children: Not on file  . Years of education: Not on file  . Highest education level: Not on file  Social Needs  . Financial resource strain: Not on file  . Food insecurity - worry: Not on file  . Food insecurity - inability: Not on file  . Transportation needs - medical: Not on file  . Transportation needs - non-medical: Not on file  Occupational History  . Occupation: Occupational hygienist  Tobacco Use  . Smoking status: Current Every Day Smoker  Packs/day: 1.00    Years: 40.00    Pack years: 40.00    Types: Cigarettes  . Smokeless tobacco: Never Used  Substance and Sexual Activity  . Alcohol use: Yes    Alcohol/week: 0.0 oz    Comment: Minimal  . Drug use: No  . Sexual activity: Not on file  Other Topics Concern  . Not on file  Social History Narrative  . Not on file  :  Pertinent items are noted in HPI.  Exam: Blood pressure 121/80, pulse 67, temperature 98 F (36.7 C), temperature source Oral, resp. rate 16, SpO2 99 %. General appearance: alert and cooperative appears without distress. Head: No scleral icterus. Throat: lips, mucosa, and tongue normal; teeth and gums normal Neck: no adenopathy Back: negative Resp: clear to auscultation bilaterally Chest wall: no tenderness Cardio: regular rate and rhythm, S1, S2 normal, no murmur, click, rub or gallop GI: soft, non-tender; bowel sounds normal; no masses,  no organomegaly Extremities: extremities normal, atraumatic, no cyanosis or edema Skin: Skin color, texture, turgor normal. No rashes or lesions Lymph nodes: Cervical, supraclavicular, and axillary nodes normal.  Recent Labs    01/12/17 1756 01/13/17 0436  WBC 7.2 6.5  HGB 14.0 13.4  HCT 39.1 38.2  PLT 204 199   Recent Labs    01/12/17 1756 01/13/17 0436  NA 129* 129*  K 4.0  3.6  CL 93* 93*  CO2 23 25  GLUCOSE 114* 107*  BUN 11 15  CREATININE 0.75 1.09*  CALCIUM 10.1 10.0    Ct Lumbar Spine W Contrast  Result Date: 01/09/2017 CLINICAL DATA:  Metastatic breast cancer, LEFT-sided low back pain. EXAM: CT LUMBAR SPINE WITH CONTRAST TECHNIQUE: Multidetector CT imaging of the lumbar spine was performed with intravenous contrast administration. CONTRAST:  66m ISOVUE-300 IOPAMIDOL (ISOVUE-300) INJECTION 61% COMPARISON:  PET scan earlier today. FINDINGS: Segmentation: Standard Alignment: No anterolisthesis. Slight degenerative scoliosis convex LEFT thoracolumbar junction. Vertebrae: Osseous destructive lesion of L4 on the LEFT extending into the pedicle, consistent with metastatic breast cancer. There is near complete destruction of the superior articular process, and pars interarticularis. Tumor also extends into the transverse process and paravertebral soft tissues. Paraspinal and other soft tissues: Aortic atherosclerosis. LEFT para-aortic lymph node at the L2 level is malignant. Disc levels: L1-L2:  Calcified protrusion.  No impingement. L2-L3: Calcified central and rightward protrusion. Facet arthropathy. RIGHT subarticular zone and foraminal zone narrowing. L3-L4: Annular bulge. Facet arthropathy. Tumor from the LEFT L4 superior articular process extends into the LEFT neural foramen and extraforaminal soft tissues. LEFT L3 and LEFT L4 neural impingement are likely. L4-L5: Calcified central and leftward protrusion. Mild stenosis. LEFT L4 metastasis extends into the pedicle and posterior elements, with inferior osseous destruction affecting the transverse process. Tumor involvement into the foramen is observed. LEFT L4 nerve root impingement related to epidural/perineural tumor is likely, possibly affecting the LEFT L5 nerve root. L5-S1:  Calcified protrusion, facet arthropathy.  No impingement. IMPRESSION: Metastatic disease to the LEFT L4 vertebral body extending into the  pedicle, transverse process, and posterior elements is likely contributory to the patient's LEFT-sided sciatica. LEFT-sided tumor extending into the foramen and extraforaminal soft tissues could affect the LEFT L3, LEFT L4, and LEFT L5 nerve roots. See discussion above. These results will be called to the ordering clinician or representative by the Radiologist Assistant, and communication documented in the PACS or zVision Dashboard. Electronically Signed   By: JStaci RighterM.D.   On: 01/09/2017 16:14  Ct Femur Left Wo Contrast  Result Date: 01/13/2017 CLINICAL DATA:  Bone lesion noted on recent PET-CT, incompletely imaged. EXAM: CT PELVIS WITHOUT CONTRAST CT OF THE LEFT FEMUR WITHOUT CONTRAST TECHNIQUE: Multidetector CT imaging of the pelvis was performed following the standard protocol without intravenous contrast. COMPARISON:  PET-CT, 01/09/2017.  Left knee radiographs, 05/21/2015. FINDINGS: MUSCULOSKELETAL: Irregular lytic lesion in the left ilium, along the roof of the left acetabulum, measuring 2.3 cm. Lesion breaches the subcortical bone of the superior left acetabulum. No other pelvic lesions. Permeative lesion is seen within the cortex of the mid left femoral shaft, spanning approximately 6 cm in length, and expanding the cortical thickness 2 7 mm. There is associated increased attenuation within the medullary cavity of the mid femur. No associated soft tissue mass. No other bone lesions. SOFT TISSUES: Within the pelvis, there are no acute abnormalities. Multiple left colon diverticula are noted without diverticulitis. A normal appendix is visualized. Bladder, uterus and adnexa are unremarkable. No adenopathy. No free fluid. Left lower extremity soft tissues show scattered femoral artery vascular calcifications. No masses or adenopathy. Normal attenuation from the 5 musculature. No knee joint effusion. IMPRESSION: 1. The lesion partly imaged on the recent PET-CT is a 6 cm long, permeative,  cortically based bone lesion along the posterior mid shaft of the left femur, associated with increased attenuation in the medullary cavity. This is consistent with an additional focus of metastatic disease from breast carcinoma. 2. Metastatic, lytic/destructive lesion of the left ilium, involving the superior acetabulum, with resorption of portions of the superior acetabular subchondral bone. 3. No other metastatic bone lesions on the included field of view. Electronically Signed   By: Lajean Manes M.D.   On: 01/13/2017 10:41   Nm Pet Image Initial (pi) Skull Base To Thigh  Addendum Date: 01/09/2017   ADDENDUM REPORT: 01/09/2017 16:09 ADDENDUM: The large destructive lumbar vertebral lesion is at the L4 level, as stated in the body of the report. The second impression bullet point lists L3 as the level, but this not correct. The large lumbar lesion is at L4. Electronically Signed   By: Misty Stanley M.D.   On: 01/09/2017 16:09   Result Date: 01/09/2017 CLINICAL DATA:  Initial treatment strategy for breast cancer. EXAM: NUCLEAR MEDICINE PET SKULL BASE TO THIGH TECHNIQUE: Nine point for mCi F-18 FDG was injected intravenously. Full-ring PET imaging was performed from the skull base to thigh after the radiotracer. CT data was obtained and used for attenuation correction and anatomic localization. FASTING BLOOD GLUCOSE:  Value: 81 mg/dl COMPARISON:  None. FINDINGS: NECK: No hypermetabolic lymph nodes in the neck. CHEST: 6 mm short axis right supraclavicular lymph node and is hypermetabolic with SUV max = 8.5. There is a tiny markedly hypermetabolic right subpectoral lymph node seen on image 40 series for an measuring about 7 mm. Emphysema noted in the lungs bilaterally. Subpleural reticulation anterior left upper lobe suggest prior radiation. 7 mm short axis inferior right hilar lymph node (image 56 series 4) is hypermetabolic with SUV max = 3.9. No suspicious pulmonary nodule or mass. ABDOMEN/PELVIS: 2.3 cm  right adrenal nodule is markedly hypermetabolic with SUV max = 35.3. Irregular air 3.9 x 3.1 cm left adrenal mass is hypermetabolic with SUV max = 61.4. 10 mm short axis left periaortic lymph node seen on image 102 of series 4 is hypermetabolic with SUV max = 14 point knee. There is abdominal aortic atherosclerosis without aneurysm. Left colonic diverticulosis without diverticulitis. SKELETON: Large destructive lesion  left aspect of the L4 vertebral body destroys the pedicle and transverse process. Tumor extension into the lateral aspect of the spinal canal at this level is evident. Lesion is markedly hypermetabolic with SUV max = 40.1. A large destructive sternal lesion measuring 4.6 x 9.4 demonstrates SUV max = 23.9. Hypermetabolic metastatic lesion is seen in the posterior left fourth rib. Incompletely visualized hypermetabolic lesion is noted in the distal left femur. Metastatic lesion noted left superior acetabulum. Uptake in the left adductor musculature of the thigh and maybe movement related, but metastatic involvement is not excluded. IMPRESSION: 1. Tiny hypermetabolic lymph nodes in the right supraclavicular region, right subpectoral space, inferior right hilum, and abdominal para-aortic retroperitoneal space. 2. Large hypermetabolic bone lesions in the sternum, L3 vertebral body, left acetabulum, and distal left femur with other smaller scattered hypermetabolic bone metastases evident. 3. Hypermetabolism in the adductor musculature of the proximal left thigh, possibly movement related but metastatic disease is a concern. 4.  Aortic Atherosclerois (ICD10-170.0) Electronically Signed: By: Misty Stanley M.D. On: 01/09/2017 15:40     Assessment and Plan:   66 year old woman with the following issues:  1.  Metastatic malignancy involving multiple bony areas: She has a large destructive lumbar vertebral lesion at the level of L4, sternal metastasis, left acetabulum and distal femur.  Small  hypermetabolic nodules noted in the supraclavicular area including subpectoral space and inferior right hilum.  The differential diagnosis was discussed today with the patient.  Given her previous history of breast cancer recurrence of her disease is a possibility.  Other malignancies could also be a consideration.  Multiple myeloma considered less likely but other solid tumor with metastatic disease to the bone would be a consideration.  From a management standpoint obtaining tissue biopsy is very critical in determining how best to treat her moving forward.  We have discussed different strategies to do this on the preferable way is to do it in the setting of a intraoperative fixation of her hip or her spine.  Percutaneous core biopsies could also be obtained by interventional radiology from any of these bone lesions as well.  I encouraged her to proceed with her left nail fixation of her femur due to risk of pathological fracture.  That will serve as a way to obtain tissue biopsy more definitively than a percutaneous biopsy.  Given her reasonable performance status and generally good health, she would be a candidate for aggressive therapy including adjuvant radiation therapy to the hip and possibly palliative chemotherapy.  I think she has a reasonable life expectancy to proceed with orthopedic surgery to palliate her disease and to prevent pathological fracture which will be devastating in her.  If if she declines this procedure, then percutaneous biopsy will be considered.  2.  History of breast cancer: The details of which are not completely clear and predominantly obtained from the patient's memory.  Her medical records are in storage facility and we will obtain them next week.  Regardless of her breast cancer history, obtaining a biopsy is critical.  3.  L3 large destructive lesion: Dr. Rolena Infante is involved in her care as she might need stabilization procedure as well.   4.  Left femur lesion: I  am in favor of surgical fixation to prevent pathological fracture and obtaining tissue biopsy.  5.  Follow-up: Once tissue biopsy is obtained and the patient is discharged, will arrange follow-up at the cancer center for further treatment of her advanced malignancy.  6.  Prognosis: This was discussed  today in detail with the patient.  Although she has an advanced malignancy that is incurable, depending on the nature of her malignancy, treatments do exist to palliate this disease for a period of time.  Her performance status remains adequate and aggressive therapy is warranted at this time.  All her questions were answered today to his satisfaction.

## 2017-01-13 NOTE — Progress Notes (Signed)
PROGRESS NOTE    Shelton Soler [facility]  KNL:[id number] DOB: 01/17/1951 DOA: 01/12/2017 PCP: Aletha Halim., PA-C    Brief Narrative: (Start on day 1 of progress note - keep it brief and live) Theresa Levine is Theresa Levine 66 y.o. female with medical history significant of breast cancer (received radiation to the left side of the chest in 2008 - she noted to me she received chemo/radiation/lumpectomy with last therapy being in the 90's), SVT, HTN, HLD, has been seen in ortho specialist office Dr. Rolena Infante for evaluation of right hip pain that has been getting progressively worse over the past week. Patient describes pain as constant and sharp, worse with weight bearing and associated with occasional cramping, numbness in the right hip area. Patient reports trying vicodin but it has not helped with pain. Patient was found to have metastatic lesion in the left femur with impeding fracture. Plan was for oncology to see prior to deciding on surgery.   Assessment & Plan:   Principal Problem:   Breast cancer metastasized to bone/Bone metastases/Bony metastasis/H/o Breast Cancer Active Problems:   Back pain/Leg pain   Pain   Bone lesions highly suspicious for metastatic lesions in pt with known hx of breast cancer  - recent imaging studies notable for large lesion in the left acetabulum and distal left femur with ? Impeding fracture  - Discussed with oncology, appreciate recs - Dr. Rolena Infante rec TLSO brace - Ortho following, Dr. Lyla Glassing to see - for now provide analgesia as needed - non weight bearing for now   Hyponatremia:  - urine osm, urine Na, serum osm - mild, ctm for now    HTN - continue home medical regimen losartan and metoprolol    Hypothyroidism - continue synthroid (TSH elevated, will discuss dosing with pt and consider increase in dose)     Smoker - provide nicotine patch   DVT prophylaxis: lovenox Code Status: full  Family Communication: none at bedside Disposition Plan:  pending   Consultants:   Ortho, Oncology  Procedures: (Don't include imaging studies which can be auto populated. Include things that cannot be auto populated i.e. Echo, Carotid and venous dopplers, Foley, Bipap, HD, tubes/drains, wound vac, central lines etc)  none  Antimicrobials: (specify start and planned stop date. Auto populated tables are space occupying and do not give end dates)  none    Subjective: Pain in R shoulder, chest, L leg. Able to walk with difficulty. Breast cancer in 90s, radiation and chemotherapy then, lumpectomy.  Had had normal mammograms since.  Objective: Vitals:   01/12/17 1752 01/13/17 0518  BP: 117/80 121/80  Pulse: 78 67  Resp: 16 16  Temp: 98 F (36.7 C) 98 F (36.7 C)  TempSrc: Oral Oral  SpO2: 99% 99%    Intake/Output Summary (Last 24 hours) at 01/13/2017 1114 Last data filed at 01/13/2017 1000 Gross per 24 hour  Intake 715 ml  Output -  Net 715 ml   There were no vitals filed for this visit.  Examination:  General exam: Appears calm and comfortable  Respiratory system: Clear to auscultation. Respiratory effort normal. Cardiovascular system: S1 & S2 heard, RRR. No JVD, murmurs, rubs, gallops or clicks. No pedal edema. Gastrointestinal system: Abdomen is nondistended, soft and nontender. No organomegaly or masses felt. Normal bowel sounds heard. Central nervous system: Alert and oriented. No focal neurological deficits. Extremities: moving all extremities Skin: No rashes, lesions or ulcers Psychiatry: Judgement and insight appear normal. Mood & affect appropriate.  Data Reviewed: I have personally reviewed following labs and imaging studies  CBC: Recent Labs  Lab 01/12/17 1756 01/13/17 0436  WBC 7.2 6.5  HGB 14.0 13.4  HCT 39.1 38.2  MCV 85.2 85.5  PLT 204 347   Basic Metabolic Panel: Recent Labs  Lab 01/09/17 1350 01/12/17 1756 01/13/17 0436  NA  --  129* 129*  K  --  4.0 3.6  CL  --  93* 93*  CO2  --   23 25  GLUCOSE  --  114* 107*  BUN  --  11 15  CREATININE 0.70 0.75 1.09*  CALCIUM  --  10.1 10.0   GFR: CrCl cannot be calculated (Unknown ideal weight.). Liver Function Tests: No results for input(s): AST, ALT, ALKPHOS, BILITOT, PROT, ALBUMIN in the last 168 hours. No results for input(s): LIPASE, AMYLASE in the last 168 hours. No results for input(s): AMMONIA in the last 168 hours. Coagulation Profile: No results for input(s): INR, PROTIME in the last 168 hours. Cardiac Enzymes: No results for input(s): CKTOTAL, CKMB, CKMBINDEX, TROPONINI in the last 168 hours. BNP (last 3 results) No results for input(s): PROBNP in the last 8760 hours. HbA1C: No results for input(s): HGBA1C in the last 72 hours. CBG: Recent Labs  Lab 01/09/17 1332  GLUCAP 81   Lipid Profile: No results for input(s): CHOL, HDL, LDLCALC, TRIG, CHOLHDL, LDLDIRECT in the last 72 hours. Thyroid Function Tests: Recent Labs    01/13/17 0436  TSH 8.829*   Anemia Panel: No results for input(s): VITAMINB12, FOLATE, FERRITIN, TIBC, IRON, RETICCTPCT in the last 72 hours. Sepsis Labs: No results for input(s): PROCALCITON, LATICACIDVEN in the last 168 hours.  No results found for this or any previous visit (from the past 240 hour(s)).       Radiology Studies: Ct Pelvis Wo Contrast  Result Date: 01/13/2017 CLINICAL DATA:  Bone lesion noted on recent PET-CT, incompletely imaged. EXAM: CT PELVIS WITHOUT CONTRAST CT OF THE LEFT FEMUR WITHOUT CONTRAST TECHNIQUE: Multidetector CT imaging of the pelvis was performed following the standard protocol without intravenous contrast. COMPARISON:  PET-CT, 01/09/2017.  Left knee radiographs, 05/21/2015. FINDINGS: MUSCULOSKELETAL: Irregular lytic lesion in the left ilium, along the roof of the left acetabulum, measuring 2.3 cm. Lesion breaches the subcortical bone of the superior left acetabulum. No other pelvic lesions. Permeative lesion is seen within the cortex of the mid  left femoral shaft, spanning approximately 6 cm in length, and expanding the cortical thickness 2 7 mm. There is associated increased attenuation within the medullary cavity of the mid femur. No associated soft tissue mass. No other bone lesions. SOFT TISSUES: Within the pelvis, there are no acute abnormalities. Multiple left colon diverticula are noted without diverticulitis. Jamesa Tedrick normal appendix is visualized. Bladder, uterus and adnexa are unremarkable. No adenopathy. No free fluid. Left lower extremity soft tissues show scattered femoral artery vascular calcifications. No masses or adenopathy. Normal attenuation from the 5 musculature. No knee joint effusion. IMPRESSION: 1. The lesion partly imaged on the recent PET-CT is Charlott Calvario 6 cm long, permeative, cortically based bone lesion along the posterior mid shaft of the left femur, associated with increased attenuation in the medullary cavity. This is consistent with an additional focus of metastatic disease from breast carcinoma. 2. Metastatic, lytic/destructive lesion of the left ilium, involving the superior acetabulum, with resorption of portions of the superior acetabular subchondral bone. 3. No other metastatic bone lesions on the included field of view. Electronically Signed   By: Lajean Manes  M.D.   On: 01/13/2017 10:41   Ct Femur Left Wo Contrast  Result Date: 01/13/2017 CLINICAL DATA:  Bone lesion noted on recent PET-CT, incompletely imaged. EXAM: CT PELVIS WITHOUT CONTRAST CT OF THE LEFT FEMUR WITHOUT CONTRAST TECHNIQUE: Multidetector CT imaging of the pelvis was performed following the standard protocol without intravenous contrast. COMPARISON:  PET-CT, 01/09/2017.  Left knee radiographs, 05/21/2015. FINDINGS: MUSCULOSKELETAL: Irregular lytic lesion in the left ilium, along the roof of the left acetabulum, measuring 2.3 cm. Lesion breaches the subcortical bone of the superior left acetabulum. No other pelvic lesions. Permeative lesion is seen within the  cortex of the mid left femoral shaft, spanning approximately 6 cm in length, and expanding the cortical thickness 2 7 mm. There is associated increased attenuation within the medullary cavity of the mid femur. No associated soft tissue mass. No other bone lesions. SOFT TISSUES: Within the pelvis, there are no acute abnormalities. Multiple left colon diverticula are noted without diverticulitis. Mikail Goostree normal appendix is visualized. Bladder, uterus and adnexa are unremarkable. No adenopathy. No free fluid. Left lower extremity soft tissues show scattered femoral artery vascular calcifications. No masses or adenopathy. Normal attenuation from the 5 musculature. No knee joint effusion. IMPRESSION: 1. The lesion partly imaged on the recent PET-CT is Janetta Vandoren 6 cm long, permeative, cortically based bone lesion along the posterior mid shaft of the left femur, associated with increased attenuation in the medullary cavity. This is consistent with an additional focus of metastatic disease from breast carcinoma. 2. Metastatic, lytic/destructive lesion of the left ilium, involving the superior acetabulum, with resorption of portions of the superior acetabular subchondral bone. 3. No other metastatic bone lesions on the included field of view. Electronically Signed   By: Lajean Manes M.D.   On: 01/13/2017 10:41        Scheduled Meds: . aspirin EC  81 mg Oral Daily  . enoxaparin (LOVENOX) injection  40 mg Subcutaneous Q24H  . feeding supplement (ENSURE ENLIVE)  237 mL Oral BID BM  . levothyroxine  25 mcg Oral QAC breakfast  . losartan  50 mg Oral Daily  . metoprolol succinate  100 mg Oral q1800  . nicotine  21 mg Transdermal Daily  . pantoprazole sodium  40 mg Oral Daily   Continuous Infusions:   LOS: 1 day    Time spent: over 30 min    Fayrene Helper, MD [facility] Pager [phone]   If 7PM-7AM, please contact night-coverage [email or web address] Password [email or web address] 01/13/2017, 11:14 AM

## 2017-01-14 LAB — CBC
HEMATOCRIT: 38.2 % (ref 36.0–46.0)
HEMOGLOBIN: 13.5 g/dL (ref 12.0–15.0)
MCH: 30.5 pg (ref 26.0–34.0)
MCHC: 35.3 g/dL (ref 30.0–36.0)
MCV: 86.2 fL (ref 78.0–100.0)
Platelets: 200 10*3/uL (ref 150–400)
RBC: 4.43 MIL/uL (ref 3.87–5.11)
RDW: 15.4 % (ref 11.5–15.5)
WBC: 5.9 10*3/uL (ref 4.0–10.5)

## 2017-01-14 LAB — BASIC METABOLIC PANEL
ANION GAP: 13 (ref 5–15)
BUN: 22 mg/dL — ABNORMAL HIGH (ref 6–20)
CHLORIDE: 92 mmol/L — AB (ref 101–111)
CO2: 24 mmol/L (ref 22–32)
Calcium: 10 mg/dL (ref 8.9–10.3)
Creatinine, Ser: 1.37 mg/dL — ABNORMAL HIGH (ref 0.44–1.00)
GFR calc non Af Amer: 39 mL/min — ABNORMAL LOW (ref >60)
GFR, EST AFRICAN AMERICAN: 45 mL/min — AB (ref >60)
Glucose, Bld: 102 mg/dL — ABNORMAL HIGH (ref 65–99)
POTASSIUM: 3.9 mmol/L (ref 3.5–5.1)
SODIUM: 129 mmol/L — AB (ref 135–145)

## 2017-01-14 MED ORDER — SODIUM CHLORIDE 0.9 % IV SOLN
INTRAVENOUS | Status: DC
  Administered 2017-01-14: 1000 mL via INTRAVENOUS
  Administered 2017-01-16 – 2017-01-18 (×6): via INTRAVENOUS

## 2017-01-14 MED ORDER — SODIUM CHLORIDE 0.9 % IV BOLUS (SEPSIS)
500.0000 mL | Freq: Once | INTRAVENOUS | Status: AC
  Administered 2017-01-14: 500 mL via INTRAVENOUS

## 2017-01-14 MED ORDER — LORAZEPAM 1 MG PO TABS
1.0000 mg | ORAL_TABLET | Freq: Once | ORAL | Status: AC
  Administered 2017-01-14: 1 mg via ORAL
  Filled 2017-01-14: qty 1

## 2017-01-14 MED ORDER — HYDROMORPHONE HCL 1 MG/ML IJ SOLN
0.5000 mg | INTRAMUSCULAR | Status: DC | PRN
  Administered 2017-01-14 – 2017-01-17 (×11): 0.5 mg via INTRAVENOUS
  Filled 2017-01-14 (×12): qty 1

## 2017-01-14 NOTE — Progress Notes (Signed)
PROGRESS NOTE    Theresa Levine [facility]  DVV:[id number] DOB: 1951/03/03 DOA: 01/12/2017 PCP: Aletha Halim., PA-C    Brief Narrative: (Start on day 1 of progress note - keep it brief and live) Theresa Levine is Theresa Levine 67 y.o. female with medical history significant of breast cancer (received radiation to the left side of the chest in 2008 - she noted to me she received chemo/radiation/lumpectomy with last therapy being in the 90's), SVT, HTN, HLD, has been seen in ortho specialist office Dr. Rolena Infante for evaluation of right hip pain that has been getting progressively worse over the past week. Patient describes pain as constant and sharp, worse with weight bearing and associated with occasional cramping, numbness in the right hip area. Patient reports trying vicodin but it has not helped with pain. Patient was found to have metastatic lesion in the left femur with impeding fracture. Plan was for oncology to see prior to deciding on surgery.   Assessment & Plan:   Principal Problem:   Breast cancer metastasized to bone/Bone metastases/Bony metastasis/H/o Breast Cancer Active Problems:   Back pain/Leg pain   Pain   Bone lesions highly suspicious for metastatic lesions in pt with known hx of breast cancer  - recent imaging studies notable for large lesion in the left acetabulum and distal left femur with ? Impeding fracture  - Discussed with oncology, appreciate recs - Dr. Rolena Infante rec TLSO brace - Ortho following, Dr. Lyla Glassing to see - for now provide analgesia as needed - non weight bearing for now   Hyponatremia:  - urine osm, urine Na, serum osm - mild, ctm for now  AKI: Maybe related to soft BP's.  Will hold losartan.  Bolus.  IVF.   F/u Urinalysis.     HTN, but pt recently with low BP's - possibly related to dilaudid? Will decrease dose and space out Theresa Levine bit.  Bolus and IVF.  Hold losartan.  - continue metoprolol    Hypothyroidism - continue synthroid (TSH elevated, will discuss  dosing with pt and consider increase in dose)     Smoker - provide nicotine patch   DVT prophylaxis: lovenox Code Status: full  Family Communication: none at bedside Disposition Plan: pending   Consultants:   Ortho, Oncology  Procedures: (Don't include imaging studies which can be auto populated. Include things that cannot be auto populated i.e. Echo, Carotid and venous dopplers, Foley, Bipap, HD, tubes/drains, wound vac, central lines etc)  none  Antimicrobials: (specify start and planned stop date. Auto populated tables are space occupying and do not give end dates)  none    Subjective: Some pain.   Concerned about options. Has many questions about potential procedure.  Isn't sure she wants to do this.  Objective: Vitals:   01/13/17 1949 01/14/17 0450 01/14/17 0900 01/14/17 1354  BP: (!) 91/58 (!) 71/62 95/71 (!) 137/97  Pulse: 76 84 81 97  Resp: 16 16  18   Temp: 98.3 F (36.8 C) 97.9 F (36.6 C)  98.1 F (36.7 C)  TempSrc: Oral Oral  Oral  SpO2: 98% 100% 97% 96%    Intake/Output Summary (Last 24 hours) at 01/14/2017 1454 Last data filed at 01/14/2017 1259 Gross per 24 hour  Intake 1195 ml  Output 900 ml  Net 295 ml   There were no vitals filed for this visit.  Examination:  General: No acute distress. Cardiovascular: Heart sounds show Theresa Levine regular rate, and rhythm. No gallops or rubs. No murmurs. No JVD. Lungs: Clear  to auscultation bilaterally with good air movement. No rales, rhonchi or wheezes. Abdomen: Soft, nontender, nondistended with normal active bowel sounds. No masses. No hepatosplenomegaly. Neurological: Alert and oriented 3. Moves all extremities 4 with equal strength. Cranial nerves II through XII grossly intact. Skin: Warm and dry. No rashes or lesions. Extremities: No clubbing or cyanosis. No edema. Pedal pulses 2+. Psychiatric: Mood and affect are normal. Insight and judgment are appropriate.   Data Reviewed: I have personally reviewed  following labs and imaging studies  CBC: Recent Labs  Lab 01/12/17 1756 01/13/17 0436 01/14/17 0410  WBC 7.2 6.5 5.9  HGB 14.0 13.4 13.5  HCT 39.1 38.2 38.2  MCV 85.2 85.5 86.2  PLT 204 199 696   Basic Metabolic Panel: Recent Labs  Lab 01/09/17 1350 01/12/17 1756 01/13/17 0436 01/14/17 0410  NA  --  129* 129* 129*  K  --  4.0 3.6 3.9  CL  --  93* 93* 92*  CO2  --  23 25 24   GLUCOSE  --  114* 107* 102*  BUN  --  11 15 22*  CREATININE 0.70 0.75 1.09* 1.37*  CALCIUM  --  10.1 10.0 10.0   GFR: CrCl cannot be calculated (Unknown ideal weight.). Liver Function Tests: No results for input(s): AST, ALT, ALKPHOS, BILITOT, PROT, ALBUMIN in the last 168 hours. No results for input(s): LIPASE, AMYLASE in the last 168 hours. No results for input(s): AMMONIA in the last 168 hours. Coagulation Profile: No results for input(s): INR, PROTIME in the last 168 hours. Cardiac Enzymes: No results for input(s): CKTOTAL, CKMB, CKMBINDEX, TROPONINI in the last 168 hours. BNP (last 3 results) No results for input(s): PROBNP in the last 8760 hours. HbA1C: No results for input(s): HGBA1C in the last 72 hours. CBG: Recent Labs  Lab 01/09/17 1332  GLUCAP 81   Lipid Profile: No results for input(s): CHOL, HDL, LDLCALC, TRIG, CHOLHDL, LDLDIRECT in the last 72 hours. Thyroid Function Tests: Recent Labs    01/13/17 0436 01/13/17 1157  TSH 8.829*  --   FREET4  --  0.96   Anemia Panel: No results for input(s): VITAMINB12, FOLATE, FERRITIN, TIBC, IRON, RETICCTPCT in the last 72 hours. Sepsis Labs: No results for input(s): PROCALCITON, LATICACIDVEN in the last 168 hours.  No results found for this or any previous visit (from the past 240 hour(s)).       Radiology Studies: Ct Pelvis Wo Contrast  Result Date: 01/13/2017 CLINICAL DATA:  Bone lesion noted on recent PET-CT, incompletely imaged. EXAM: CT PELVIS WITHOUT CONTRAST CT OF THE LEFT FEMUR WITHOUT CONTRAST TECHNIQUE:  Multidetector CT imaging of the pelvis was performed following the standard protocol without intravenous contrast. COMPARISON:  PET-CT, 01/09/2017.  Left knee radiographs, 05/21/2015. FINDINGS: MUSCULOSKELETAL: Irregular lytic lesion in the left ilium, along the roof of the left acetabulum, measuring 2.3 cm. Lesion breaches the subcortical bone of the superior left acetabulum. No other pelvic lesions. Permeative lesion is seen within the cortex of the mid left femoral shaft, spanning approximately 6 cm in length, and expanding the cortical thickness 2 7 mm. There is associated increased attenuation within the medullary cavity of the mid femur. No associated soft tissue mass. No other bone lesions. SOFT TISSUES: Within the pelvis, there are no acute abnormalities. Multiple left colon diverticula are noted without diverticulitis. Theresa Levine normal appendix is visualized. Bladder, uterus and adnexa are unremarkable. No adenopathy. No free fluid. Left lower extremity soft tissues show scattered femoral artery vascular calcifications. No masses or  adenopathy. Normal attenuation from the 5 musculature. No knee joint effusion. IMPRESSION: 1. The lesion partly imaged on the recent PET-CT is Theresa Levine 6 cm long, permeative, cortically based bone lesion along the posterior mid shaft of the left femur, associated with increased attenuation in the medullary cavity. This is consistent with an additional focus of metastatic disease from breast carcinoma. 2. Metastatic, lytic/destructive lesion of the left ilium, involving the superior acetabulum, with resorption of portions of the superior acetabular subchondral bone. 3. No other metastatic bone lesions on the included field of view. Electronically Signed   By: Theresa Levine M.D.   On: 01/13/2017 10:41   Ct Femur Left Wo Contrast  Result Date: 01/13/2017 CLINICAL DATA:  Bone lesion noted on recent PET-CT, incompletely imaged. EXAM: CT PELVIS WITHOUT CONTRAST CT OF THE LEFT FEMUR WITHOUT  CONTRAST TECHNIQUE: Multidetector CT imaging of the pelvis was performed following the standard protocol without intravenous contrast. COMPARISON:  PET-CT, 01/09/2017.  Left knee radiographs, 05/21/2015. FINDINGS: MUSCULOSKELETAL: Irregular lytic lesion in the left ilium, along the roof of the left acetabulum, measuring 2.3 cm. Lesion breaches the subcortical bone of the superior left acetabulum. No other pelvic lesions. Permeative lesion is seen within the cortex of the mid left femoral shaft, spanning approximately 6 cm in length, and expanding the cortical thickness 2 7 mm. There is associated increased attenuation within the medullary cavity of the mid femur. No associated soft tissue mass. No other bone lesions. SOFT TISSUES: Within the pelvis, there are no acute abnormalities. Multiple left colon diverticula are noted without diverticulitis. Theresa Levine normal appendix is visualized. Bladder, uterus and adnexa are unremarkable. No adenopathy. No free fluid. Left lower extremity soft tissues show scattered femoral artery vascular calcifications. No masses or adenopathy. Normal attenuation from the 5 musculature. No knee joint effusion. IMPRESSION: 1. The lesion partly imaged on the recent PET-CT is Theresa Levine 6 cm long, permeative, cortically based bone lesion along the posterior mid shaft of the left femur, associated with increased attenuation in the medullary cavity. This is consistent with an additional focus of metastatic disease from breast carcinoma. 2. Metastatic, lytic/destructive lesion of the left ilium, involving the superior acetabulum, with resorption of portions of the superior acetabular subchondral bone. 3. No other metastatic bone lesions on the included field of view. Electronically Signed   By: Theresa Levine M.D.   On: 01/13/2017 10:41        Scheduled Meds: . acetaminophen  1,000 mg Oral Q8H  . aspirin EC  81 mg Oral Daily  . enoxaparin (LOVENOX) injection  40 mg Subcutaneous Q24H  . feeding  supplement (ENSURE ENLIVE)  237 mL Oral BID BM  . levothyroxine  25 mcg Oral QAC breakfast  . metoprolol succinate  100 mg Oral q1800  . nicotine  21 mg Transdermal Daily  . pantoprazole sodium  40 mg Oral Daily   Continuous Infusions: . sodium chloride 1,000 mL (01/14/17 1300)     LOS: 2 days    Time spent: over 30 min    Fayrene Helper, MD [facility] Pager [phone]   If 7PM-7AM, please contact night-coverage [email or web address] Password [email or web address] 01/14/2017, 2:54 PM

## 2017-01-14 NOTE — Progress Notes (Signed)
   Subjective:    Recheck left hip and lumbar spine s/p recent diagnosis of multiple bony mets s/p breast cancer Pt very concerned about recommended surgery for left hip and has some reservations C/o pain to bilateral shoulders, sternum, lumbar spine, and left hip region Denies any recent falls  Relates any muscle weakness to prior statin use  Patient reports pain as moderate.  Objective:   VITALS:   Vitals:   01/13/17 1949 01/14/17 0450  BP: (!) 91/58 (!) 71/62  Pulse: 76 84  Resp: 16 16  Temp: 98.3 F (36.8 C) 97.9 F (36.6 C)  SpO2: 98% 100%    Pelvis: no pain with ER or IR, logroll nv intact distally No rashes or edema Good rom and weight bearing as tolerated currently  LABS Recent Labs    01/12/17 1756 01/13/17 0436 01/14/17 0410  HGB 14.0 13.4 13.5  HCT 39.1 38.2 38.2  WBC 7.2 6.5 5.9  PLT 204 199 200    Recent Labs    01/12/17 1756 01/13/17 0436 01/14/17 0410  NA 129* 129* 129*  K 4.0 3.6 3.9  BUN 11 15 22*  CREATININE 0.75 1.09* 1.37*  GLUCOSE 114* 107* 102*     Assessment/Plan:   Multiple bony mets s/p breast cancer Reviewed prior notes from Dr. Rolena Infante and oncology Currently awaiting consultation with Dr. Lyla Glassing and discuss surgical options for the left hip I had a long conversation with the patient this morning and pt is very hesitant about surgery if she only has a few months to live Unfortunately to get an adequate biopsy surgery has been recommended by oncology and Dr. Dietrich Pates Pt will continue to think about her options and discuss case with Dr. Lyla Glassing Continue activity as tolerated and pain management as needed     Merla Riches, [place], PA-C  01/14/2017, 8:51 AM

## 2017-01-14 NOTE — Progress Notes (Signed)
I stopped by at the request of the medicine team due to questions from the family as to a plan for the left femur.  I have spoken with Dr Rolena Infante who is aware of the results of the CT scan of the pelvis and femur.  There is a lesion in the acetabulum that appears amenable to CT guided core biopsy by Interventional Radiology.  The patient is not interested in any surgery for the femur until the lesion has been biopsied and there is a diagnosis and prognosis.  Dr Rolena Infante will be seeing the patient in the morning and ordering the biopsy. NPO after MN in anticipation of possible biopsy tomorrow.  The family is also reporting severe forgetfulness and MS changes and were worried the patient may need a head scan.  I have made Dr Rolena Infante aware of this.  Augustin Schooling, MD

## 2017-01-14 NOTE — Progress Notes (Signed)
Pt AND HER FAMILY HAVE BEEN TOLD several times THE PT IS NWB on her Rt. Leg and that she needs to use the bedside commode. Family continues to ambulate her to the bathroom. She also is not using her back brace. Pt has been very depressed today. She states she wants to know her prognosis and go home. She has been crying to her daughter that she is tired of all the procedures. One time dose of antianxiety medicine given to pt. Family in to visit

## 2017-01-14 NOTE — Progress Notes (Signed)
Pt's daughter states Dr. Veverly Fells in this afternoon and told her and  the pt can ambulate to the bathroom with a walker with WB. She will be NPO after midnight for BX in the AM.

## 2017-01-15 ENCOUNTER — Inpatient Hospital Stay (HOSPITAL_COMMUNITY): Payer: PPO

## 2017-01-15 ENCOUNTER — Ambulatory Visit: Payer: PPO | Admitting: Radiation Oncology

## 2017-01-15 DIAGNOSIS — N390 Urinary tract infection, site not specified: Secondary | ICD-10-CM

## 2017-01-15 DIAGNOSIS — C50919 Malignant neoplasm of unspecified site of unspecified female breast: Secondary | ICD-10-CM

## 2017-01-15 LAB — BASIC METABOLIC PANEL
Anion gap: 10 (ref 5–15)
BUN: 17 mg/dL (ref 6–20)
CO2: 23 mmol/L (ref 22–32)
CREATININE: 0.92 mg/dL (ref 0.44–1.00)
Calcium: 9.1 mg/dL (ref 8.9–10.3)
Chloride: 99 mmol/L — ABNORMAL LOW (ref 101–111)
GFR calc Af Amer: >60 mL/min (ref >60)
Glucose, Bld: 88 mg/dL (ref 65–99)
Potassium: 3.8 mmol/L (ref 3.5–5.1)
SODIUM: 132 mmol/L — AB (ref 135–145)

## 2017-01-15 LAB — CBC
HCT: 33.5 % — ABNORMAL LOW (ref 36.0–46.0)
Hemoglobin: 11.5 g/dL — ABNORMAL LOW (ref 12.0–15.0)
MCH: 29.6 pg (ref 26.0–34.0)
MCHC: 34.3 g/dL (ref 30.0–36.0)
MCV: 86.3 fL (ref 78.0–100.0)
PLATELETS: 173 10*3/uL (ref 150–400)
RBC: 3.88 MIL/uL (ref 3.87–5.11)
RDW: 15.4 % (ref 11.5–15.5)
WBC: 5.1 10*3/uL (ref 4.0–10.5)

## 2017-01-15 LAB — URINALYSIS, ROUTINE W REFLEX MICROSCOPIC
Bilirubin Urine: NEGATIVE
Glucose, UA: NEGATIVE mg/dL
KETONES UR: NEGATIVE mg/dL
Nitrite: POSITIVE — AB
PH: 5 (ref 5.0–8.0)
Protein, ur: 30 mg/dL — AB
SPECIFIC GRAVITY, URINE: 1.014 (ref 1.005–1.030)
SQUAMOUS EPITHELIAL / LPF: NONE SEEN

## 2017-01-15 LAB — PROTIME-INR
INR: 1.04
Prothrombin Time: 13.5 seconds (ref 11.4–15.2)

## 2017-01-15 MED ORDER — DEXTROSE 5 % IV SOLN
1.0000 g | INTRAVENOUS | Status: AC
  Administered 2017-01-15 – 2017-01-19 (×5): 1 g via INTRAVENOUS
  Filled 2017-01-15 (×7): qty 10

## 2017-01-15 MED ORDER — MIDAZOLAM HCL 2 MG/2ML IJ SOLN
INTRAMUSCULAR | Status: AC | PRN
  Administered 2017-01-15 (×3): 1 mg via INTRAVENOUS

## 2017-01-15 MED ORDER — SODIUM CHLORIDE 0.9 % IV SOLN
INTRAVENOUS | Status: AC
  Filled 2017-01-15: qty 250

## 2017-01-15 MED ORDER — GADOBENATE DIMEGLUMINE 529 MG/ML IV SOLN
20.0000 mL | Freq: Once | INTRAVENOUS | Status: AC | PRN
  Administered 2017-01-15: 17 mL via INTRAVENOUS

## 2017-01-15 MED ORDER — FENTANYL CITRATE (PF) 100 MCG/2ML IJ SOLN
INTRAMUSCULAR | Status: AC | PRN
  Administered 2017-01-15 (×2): 50 ug via INTRAVENOUS

## 2017-01-15 MED ORDER — FENTANYL CITRATE (PF) 100 MCG/2ML IJ SOLN
INTRAMUSCULAR | Status: AC
  Filled 2017-01-15: qty 4

## 2017-01-15 MED ORDER — LIP MEDEX EX OINT
TOPICAL_OINTMENT | CUTANEOUS | Status: AC
  Administered 2017-01-15: 1
  Filled 2017-01-15: qty 7

## 2017-01-15 MED ORDER — MIDAZOLAM HCL 2 MG/2ML IJ SOLN
INTRAMUSCULAR | Status: AC
  Filled 2017-01-15: qty 4

## 2017-01-15 MED ORDER — LEVOTHYROXINE SODIUM 25 MCG PO TABS
37.5000 ug | ORAL_TABLET | Freq: Every day | ORAL | Status: DC
  Administered 2017-01-16 – 2017-01-20 (×5): 37.5 ug via ORAL
  Filled 2017-01-15 (×5): qty 2

## 2017-01-15 MED ORDER — LIDOCAINE HCL (PF) 1 % IJ SOLN
INTRAMUSCULAR | Status: AC | PRN
  Administered 2017-01-15: 20 mL

## 2017-01-15 NOTE — Consult Note (Signed)
Chief Complaint: Patient was seen in consultation today for CT-guided L4 lesion biopsy  Referring Physician(s): Brooks,D/Shadad,F  Supervising Physician: Markus Daft  Patient Status: [facility] - In-pt  History of Present Illness: Theresa Levine is a 66 y.o. female smoker with remote history of left breast cancer in 1997, status post lumpectomy and chemoradiation.  She now presents to the hospital with history of weakness as well as left hip, low back, right shoulder, sternal pain.  Recent imaging has revealed potential metastatic disease to the left L4 vertebral body extending into the pedicle, transverse process and posterior elements, posterior midshaft left femur lesion, lytic/destructive lesion of the left ilium involving the superior acetabulum as well as tiny hypermetabolic lymph nodes in the right supraclavicular, right subpectoral space,  inferior right hilum and abdominal periaortic retroperitoneal space.  Request now received for CT-guided bone biopsy for further evaluation.  Past Medical History:  Diagnosis Date  . Anxiety   . Breast cancer ([place])   . Depression   . FHx: chemotherapy 1998   Radiation left in chest in 2008  . Gastroesophageal reflux disease   . History of percutaneous left heart catheterization September 2009   This showed only luminal irregularities, EF was 60%   . History of stress test    ETT 6/17: Poor Ex capacity, Ex 3' (4.6 METs), no ischemic ST changes, hypertensive BP response // ETT-Echo 8/17: normal  . Hyperlipidemia    unable to tolerate Crestor, Lipitor, or pravastatin  . Hypertension   . Hypothyroidism   . Mild mitral regurgitation by prior echocardiogram September 2009   Showed an EF of 55-60%  . Palpitations   . Supraventricular tachycardia ([place])    The patient had an episode of SVT in September 2009. Her heart rate was reported  to be around 240 by EMS., However, there were no strips to evaluate. It did terminate with  adenosine. 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 The patient did have an event monitor done in October 2009 that showed only normal sinus rhythm.   . Tobacco user     Past Surgical History:  Procedure Laterality Date  . CARDIAC CATHETERIZATION  12/02/2006   Eustace Quail, MD. Lumpectomy and radical nide dissection of the left breast  . MASTECTOMY  1997    Allergies: Crestor [rosuvastatin calcium]; Ezetimibe; Rosuvastatin calcium; Sulfa antibiotics; and Sulfacetamide sodium  Medications: Prior to Admission medications   Medication Sig Start Date End Date Taking? Authorizing Provider  acetaminophen (TYLENOL) 500 MG tablet Take 1,000 mg every 2 (two) hours as needed by mouth for mild pain or moderate pain.   Yes [provider]  ALPRAZolam (XANAX) 0.25 MG tablet Take 0.25 mg by mouth at bedtime as needed for anxiety.   Yes [provider]  aspirin 81 MG tablet Take 81 mg by mouth daily.   Yes [provider]  esomeprazole (NEXIUM) 20 MG capsule Take 40 mg daily at 12 noon by mouth.   Yes [provider]  HYDROcodone-acetaminophen (NORCO/VICODIN) 5-325 MG tablet Take 1 tablet every 2 (two) hours as needed by mouth. 01/05/17  Yes [provider]  levothyroxine (LEVOTHROID) 25 MCG tablet Take 25 mcg by mouth daily.     Yes [provider]  losartan (COZAAR) 50 MG tablet Take 1 tablet (50 mg total) by mouth daily. 12/05/16  Yes Weaver, Scott T, PA-C  metoprolol succinate (TOPROL-XL) 100 MG 24 hr tablet Take 1 tablet (100 mg total) by mouth daily. Take with or  immediately following a meal. Patient taking differently: Take 100 mg at bedtime by mouth. Take with or immediately following a meal.  12/20/16  Yes Weaver, Scott T, PA-C  traMADol (ULTRAM) 50 MG tablet Take 50 mg every 2 (two) hours as needed by mouth. 12/25/16  Yes [provider]  meclizine (ANTIVERT) 25 MG tablet Take one every 8 hours as needed for dizziness/vertigo Patient not taking:  Reported on 01/12/2017 11/16/10   Larey Dresser, MD     Family History  Problem Relation Age of Onset  . Heart attack Father 64  . Esophageal cancer Brother   . Fainting Sister        Had a pacemaker put in  . Stroke Paternal Grandfather     Social History   Socioeconomic History  . Marital status: Divorced    Spouse name: None  . Number of children: None  . Years of education: None  . Highest education level: None  Social Needs  . Financial resource strain: None  . Food insecurity - worry: None  . Food insecurity - inability: None  . Transportation needs - medical: None  . Transportation needs - non-medical: None  Occupational History  . Occupation: Occupational hygienist  Tobacco Use  . Smoking status: Current Every Day Smoker    Packs/day: 1.00    Years: 40.00    Pack years: 40.00    Types: Cigarettes  . Smokeless tobacco: Never Used  Substance and Sexual Activity  . Alcohol use: Yes    Alcohol/week: 0.0 oz    Comment: Minimal  . Drug use: No  . Sexual activity: None  Other Topics Concern  . None  Social History Narrative  . None      Review of Systems see above; currently denies fever, headache, dyspnea, cough, abdominal pain, nausea, vomiting or bleeding. Vital Signs: BP 122/86 (BP Location: Right Arm)   Pulse 73   Temp 97.6 F (36.4 C) (Oral)   Resp 17   SpO2 99%   Physical Exam awake, alert.  Chest clear to auscultation bilaterally.  Heart with regular rate and rhythm.  Abdomen soft, positive bowel sounds, nontender.  No lower extremity edema.  Patient has noted tenderness in the right shoulder, sternal, left hip/lower back and left thigh regions.   Imaging: Ct Lumbar Spine W Contrast  Result Date: 01/09/2017 CLINICAL DATA:  Metastatic breast cancer, LEFT-sided low back pain. EXAM: CT LUMBAR SPINE WITH CONTRAST TECHNIQUE: Multidetector CT imaging of the lumbar spine was performed with intravenous contrast administration. CONTRAST:  52mL ISOVUE-300  IOPAMIDOL (ISOVUE-300) INJECTION 61% COMPARISON:  PET scan earlier today. FINDINGS: Segmentation: Standard Alignment: No anterolisthesis. Slight degenerative scoliosis convex LEFT thoracolumbar junction. Vertebrae: Osseous destructive lesion of L4 on the LEFT extending into the pedicle, consistent with metastatic breast cancer. There is near complete destruction of the superior articular process, and pars interarticularis. Tumor also extends into the transverse process and paravertebral soft tissues. Paraspinal and other soft tissues: Aortic atherosclerosis. LEFT para-aortic lymph node at the L2 level is malignant. Disc levels: L1-L2:  Calcified protrusion.  No impingement. L2-L3: Calcified central and rightward protrusion. Facet arthropathy. RIGHT subarticular zone and foraminal zone narrowing. L3-L4: Annular bulge. Facet arthropathy. Tumor from the LEFT L4 superior articular process extends into the LEFT neural foramen and extraforaminal soft tissues. LEFT L3 and LEFT L4 neural impingement are likely. L4-L5: Calcified central and leftward protrusion. Mild stenosis. LEFT L4 metastasis extends into the pedicle and posterior elements, with inferior osseous destruction affecting the  transverse process. Tumor involvement into the foramen is observed. LEFT L4 nerve root impingement related to epidural/perineural tumor is likely, possibly affecting the LEFT L5 nerve root. L5-S1:  Calcified protrusion, facet arthropathy.  No impingement. IMPRESSION: Metastatic disease to the LEFT L4 vertebral body extending into the pedicle, transverse process, and posterior elements is likely contributory to the patient's LEFT-sided sciatica. LEFT-sided tumor extending into the foramen and extraforaminal soft tissues could affect the LEFT L3, LEFT L4, and LEFT L5 nerve roots. See discussion above. These results will be called to the ordering clinician or representative by the Radiologist Assistant, and communication documented in the  PACS or zVision Dashboard. Electronically Signed   By: Staci Righter M.D.   On: 01/09/2017 16:14   Ct Pelvis Wo Contrast  Result Date: 01/13/2017 CLINICAL DATA:  Bone lesion noted on recent PET-CT, incompletely imaged. EXAM: CT PELVIS WITHOUT CONTRAST CT OF THE LEFT FEMUR WITHOUT CONTRAST TECHNIQUE: Multidetector CT imaging of the pelvis was performed following the standard protocol without intravenous contrast. COMPARISON:  PET-CT, 01/09/2017.  Left knee radiographs, 05/21/2015. FINDINGS: MUSCULOSKELETAL: Irregular lytic lesion in the left ilium, along the roof of the left acetabulum, measuring 2.3 cm. Lesion breaches the subcortical bone of the superior left acetabulum. No other pelvic lesions. Permeative lesion is seen within the cortex of the mid left femoral shaft, spanning approximately 6 cm in length, and expanding the cortical thickness 2 7 mm. There is associated increased attenuation within the medullary cavity of the mid femur. No associated soft tissue mass. No other bone lesions. SOFT TISSUES: Within the pelvis, there are no acute abnormalities. Multiple left colon diverticula are noted without diverticulitis. A normal appendix is visualized. Bladder, uterus and adnexa are unremarkable. No adenopathy. No free fluid. Left lower extremity soft tissues show scattered femoral artery vascular calcifications. No masses or adenopathy. Normal attenuation from the 5 musculature. No knee joint effusion. IMPRESSION: 1. The lesion partly imaged on the recent PET-CT is a 6 cm long, permeative, cortically based bone lesion along the posterior mid shaft of the left femur, associated with increased attenuation in the medullary cavity. This is consistent with an additional focus of metastatic disease from breast carcinoma. 2. Metastatic, lytic/destructive lesion of the left ilium, involving the superior acetabulum, with resorption of portions of the superior acetabular subchondral bone. 3. No other metastatic bone  lesions on the included field of view. Electronically Signed   By: Lajean Manes M.D.   On: 01/13/2017 10:41   Ct Femur Left Wo Contrast  Result Date: 01/13/2017 CLINICAL DATA:  Bone lesion noted on recent PET-CT, incompletely imaged. EXAM: CT PELVIS WITHOUT CONTRAST CT OF THE LEFT FEMUR WITHOUT CONTRAST TECHNIQUE: Multidetector CT imaging of the pelvis was performed following the standard protocol without intravenous contrast. COMPARISON:  PET-CT, 01/09/2017.  Left knee radiographs, 05/21/2015. FINDINGS: MUSCULOSKELETAL: Irregular lytic lesion in the left ilium, along the roof of the left acetabulum, measuring 2.3 cm. Lesion breaches the subcortical bone of the superior left acetabulum. No other pelvic lesions. Permeative lesion is seen within the cortex of the mid left femoral shaft, spanning approximately 6 cm in length, and expanding the cortical thickness 2 7 mm. There is associated increased attenuation within the medullary cavity of the mid femur. No associated soft tissue mass. No other bone lesions. SOFT TISSUES: Within the pelvis, there are no acute abnormalities. Multiple left colon diverticula are noted without diverticulitis. A normal appendix is visualized. Bladder, uterus and adnexa are unremarkable. No adenopathy. No free fluid. Left  lower extremity soft tissues show scattered femoral artery vascular calcifications. No masses or adenopathy. Normal attenuation from the 5 musculature. No knee joint effusion. IMPRESSION: 1. The lesion partly imaged on the recent PET-CT is a 6 cm long, permeative, cortically based bone lesion along the posterior mid shaft of the left femur, associated with increased attenuation in the medullary cavity. This is consistent with an additional focus of metastatic disease from breast carcinoma. 2. Metastatic, lytic/destructive lesion of the left ilium, involving the superior acetabulum, with resorption of portions of the superior acetabular subchondral bone. 3. No other  metastatic bone lesions on the included field of view. Electronically Signed   By: Lajean Manes M.D.   On: 01/13/2017 10:41   Nm Pet Image Initial (pi) Skull Base To Thigh  Addendum Date: 01/09/2017   ADDENDUM REPORT: 01/09/2017 16:09 ADDENDUM: The large destructive lumbar vertebral lesion is at the L4 level, as stated in the body of the report. The second impression bullet point lists L3 as the level, but this not correct. The large lumbar lesion is at L4. Electronically Signed   By: Misty Stanley M.D.   On: 01/09/2017 16:09   Result Date: 01/09/2017 CLINICAL DATA:  Initial treatment strategy for breast cancer. EXAM: NUCLEAR MEDICINE PET SKULL BASE TO THIGH TECHNIQUE: Nine point for mCi F-18 FDG was injected intravenously. Full-ring PET imaging was performed from the skull base to thigh after the radiotracer. CT data was obtained and used for attenuation correction and anatomic localization. FASTING BLOOD GLUCOSE:  Value: 81 mg/dl COMPARISON:  None. FINDINGS: NECK: No hypermetabolic lymph nodes in the neck. CHEST: 6 mm short axis right supraclavicular lymph node and is hypermetabolic with SUV max = 8.5. There is a tiny markedly hypermetabolic right subpectoral lymph node seen on image 40 series for an measuring about 7 mm. Emphysema noted in the lungs bilaterally. Subpleural reticulation anterior left upper lobe suggest prior radiation. 7 mm short axis inferior right hilar lymph node (image 56 series 4) is hypermetabolic with SUV max = 3.9. No suspicious pulmonary nodule or mass. ABDOMEN/PELVIS: 2.3 cm right adrenal nodule is markedly hypermetabolic with SUV max = 69.4. Irregular air 3.9 x 3.1 cm left adrenal mass is hypermetabolic with SUV max = 85.4. 10 mm short axis left periaortic lymph node seen on image 102 of series 4 is hypermetabolic with SUV max = 14 point knee. There is abdominal aortic atherosclerosis without aneurysm. Left colonic diverticulosis without diverticulitis. SKELETON: Large  destructive lesion left aspect of the L4 vertebral body destroys the pedicle and transverse process. Tumor extension into the lateral aspect of the spinal canal at this level is evident. Lesion is markedly hypermetabolic with SUV max = 62.7. A large destructive sternal lesion measuring 4.6 x 9.4 demonstrates SUV max = 23.9. Hypermetabolic metastatic lesion is seen in the posterior left fourth rib. Incompletely visualized hypermetabolic lesion is noted in the distal left femur. Metastatic lesion noted left superior acetabulum. Uptake in the left adductor musculature of the thigh and maybe movement related, but metastatic involvement is not excluded. IMPRESSION: 1. Tiny hypermetabolic lymph nodes in the right supraclavicular region, right subpectoral space, inferior right hilum, and abdominal para-aortic retroperitoneal space. 2. Large hypermetabolic bone lesions in the sternum, L3 vertebral body, left acetabulum, and distal left femur with other smaller scattered hypermetabolic bone metastases evident. 3. Hypermetabolism in the adductor musculature of the proximal left thigh, possibly movement related but metastatic disease is a concern. 4.  Aortic Atherosclerois (ICD10-170.0) Electronically Signed: By: Randall Hiss  Tery Sanfilippo M.D. On: 01/09/2017 15:40   Dg Outside Films Spine  Result Date: 01/09/2017 This examination belongs to an outside facility and is stored here for comparison purposes only.  Contact the originating outside institution for any associated report or interpretation.  Mr Outside Films Spine  Result Date: 01/09/2017 This examination belongs to an outside facility and is stored here for comparison purposes only.  Contact the originating outside institution for any associated report or interpretation.   Labs:  CBC: Recent Labs    01/12/17 1756 01/13/17 0436 01/14/17 0410 01/15/17 0408  WBC 7.2 6.5 5.9 5.1  HGB 14.0 13.4 13.5 11.5*  HCT 39.1 38.2 38.2 33.5*  PLT 204 199 200 173     COAGS: No results for input(s): INR, APTT in the last 8760 hours.  BMP: Recent Labs    01/12/17 1756 01/13/17 0436 01/14/17 0410 01/15/17 0408  NA 129* 129* 129* 132*  K 4.0 3.6 3.9 3.8  CL 93* 93* 92* 99*  CO2 23 25 24 23   GLUCOSE 114* 107* 102* 88  BUN 11 15 22* 17  CALCIUM 10.1 10.0 10.0 9.1  CREATININE 0.75 1.09* 1.37* 0.92  GFRNONAA >60 51* 39* >60  GFRAA >60 59* 45* >60    LIVER FUNCTION TESTS: Recent Labs    05/04/16 0803  BILITOT 0.5  AST 23  ALT 22  ALKPHOS 77  PROT 6.7  ALBUMIN 4.3    TUMOR MARKERS: No results for input(s): AFPTM, CEA, CA199, CHROMGRNA in the last 8760 hours.  Assessment and Plan: 66 y.o. female smoker with remote history of left breast cancer in 1997, status post lumpectomy and chemoradiation.  She now presents to the hospital with history of weakness as well as left hip, low back, right shoulder, sternal pain.  Recent imaging has revealed potential metastatic disease to the left L4 vertebral body extending into the pedicle, transverse process and posterior elements, posterior midshaft left femur lesion, lytic/destructive lesion of the left ilium involving the superior acetabulum as well as tiny hypermetabolic lymph nodes in the right supraclavicular, right subpectoral space,  inferior right hilum and abdominal periaortic retroperitoneal space.  Request now received for CT-guided bone biopsy for further evaluation.  Imaging studies have been reviewed by Dr.Henn.  He feels that most accessible area to biopsy with the highest yield is at the L4 lesion.Risks and benefits discussed with the patient/sister including, but not limited to bleeding, infection, damage to adjacent structures or low yield requiring additional tests.All of the patient's questions were answered, patient is agreeable to proceed.Consent signed and in chart.  Procedure scheduled for today.     Thank you for this interesting consult.  I greatly enjoyed meeting Theresa Levine  [facility] and look forward to participating in their care.  A copy of this report was sent to the requesting provider on this date.  Electronically Signed: D. Rowe Robert, PA-C 01/15/2017, 10:41 AM   I spent a total of 30 minutes in face to face in clinical consultation, greater than 50% of which was counseling/coordinating care for CT-guided L4 lesion biopsy

## 2017-01-15 NOTE — Progress Notes (Signed)
Events noted over the last 24 hours.  Patient continues to refuse surgery to the biopsy is done and confirms the diagnosis.  I have no objections to this approach although I favor for her to have an operation to prevent pathological fractures.  I recommend proceeding with biopsy as scheduled and also obtaining MRI of the brain to rule out metastasis.  Once the results of her pathology is available, we will continue further discussions with the patient.

## 2017-01-15 NOTE — Progress Notes (Signed)
Radiation oncology was consulted, and after reading med onc and ortho notes, and speaking with them, we will plan to hold off on meeting with the patient. We will be happy to meet with her to discuss treatment if requested in the future.     Carola Rhine, PAC

## 2017-01-15 NOTE — Progress Notes (Signed)
PROGRESS NOTE    Theresa Levine  PRF:[id number] DOB: September 18, 1950 DOA: 01/12/2017 PCP: Aletha Halim., PA-C    Brief Narrative: (Start on day 1 of progress note - keep it brief and live) Theresa Levine is Theresa Levine 66 y.o. female with medical history significant of breast cancer (received radiation to the left side of the chest in 2008 - she noted to me she received chemo/radiation/lumpectomy with last therapy being in the 90's), SVT, HTN, HLD, has been seen in ortho specialist office Dr. Rolena Infante for evaluation of right hip pain that has been getting progressively worse over the past week. Patient describes pain as constant and sharp, worse with weight bearing and associated with occasional cramping, numbness in the right hip area. Patient reports trying vicodin but it has not helped with pain. Patient was found to have metastatic lesion in the left femur with impeding fracture. Plan was for oncology to see prior to deciding on surgery.   Assessment & Plan:   Principal Problem:   Breast cancer metastasized to bone/Bone metastases/Bony metastasis/H/o Breast Cancer Active Problems:   Back pain/Leg pain   Pain   Bone lesions highly suspicious for metastatic lesions in pt with known hx of breast cancer  - recent imaging studies notable for large lesion in the left acetabulum and distal left femur with ? Impeding fracture  - Discussed with oncology, appreciate recs - Dr. Rolena Infante rec TLSO brace  [ ]  to discuss management of femur with partner and possible need for IM nail prophylactically  - Ortho following - for now provide analgesia as needed - non weight bearing for now  [ ]  CT guided biopsy today for pelvic region [ ]  concern for short term memory problems, plan for MRI brain today as well  Hyponatremia:  Improving with IVF - urine osm (374), urine Na (30), serum osm (274) - mild, ctm for now  Dysuria:  UA with pyuria, hematuria, nitrite positive.  Add on urine culture.  Ceftriaxone.     AKI:  Improved today. Will continue to hold losartan.  Continue IVF   F/u Urinalysis.   HTN, but pt recently with low BP's - possibly related to dilaudid? Will decrease dose and space out Buna Cuppett bit.  Bolus and IVF.  - Hold losartan.  - continue metoprolol    Hypothyroidism - continue synthroid, increase to 37.5 mcg, f/u in about 6 weeks repeat TSH    Smoker - provide nicotine patch   DVT prophylaxis: lovenox Code Status: full  Family Communication: none at bedside Disposition Plan: pending   Consultants:   Ortho, Oncology  Procedures: (Don't include imaging studies which can be auto populated. Include things that cannot be auto populated i.e. Echo, Carotid and venous dopplers, Foley, Bipap, HD, tubes/drains, wound vac, central lines etc)  none  Antimicrobials: (specify start and planned stop date. Auto populated tables are space occupying and do not give end dates)  none    Subjective: Doing ok. Maybe some dysuria yesterday.  Otherwise doing ok, short term memory difficulties.  Not sure if this is because of cancer or because she's in hospital with lots of information, stress.   Objective: Vitals:   01/14/17 1829 01/14/17 1833 01/14/17 2119 01/15/17 0502  BP: 108/74 108/74 111/80 122/86  Pulse:  97 87 73  Resp:   19 17  Temp:   98 F (36.7 C) 97.6 F (36.4 C)  TempSrc:   Oral Oral  SpO2:   100% 99%    Intake/Output Summary (  Last 24 hours) at 01/15/2017 0936 Last data filed at 01/15/2017 0502 Gross per 24 hour  Intake 1541.67 ml  Output 1250 ml  Net 291.67 ml   There were no vitals filed for this visit.  Examination:  General: No acute distress. Cardiovascular: Heart sounds show Theresa Levine regular rate, and rhythm. No gallops or rubs. No murmurs. No JVD. Lungs: Clear to auscultation bilaterally with good air movement. No rales, rhonchi or wheezes. Abdomen: Soft, nontender, nondistended with normal active bowel sounds. No masses. No  hepatosplenomegaly. Neurological: Alert and oriented. Moves all extremities. Cranial nerves II through XII grossly intact. Skin: Warm and dry. No rashes or lesions. Extremities: No clubbing or cyanosis. No edema. Pedal pulses 2+. Psychiatric: Mood and affect are normal. Insight and judgment are appropriate.  Data Reviewed: I have personally reviewed following labs and imaging studies  CBC: Recent Labs  Lab 01/12/17 1756 01/13/17 0436 01/14/17 0410 01/15/17 0408  WBC 7.2 6.5 5.9 5.1  HGB 14.0 13.4 13.5 11.5*  HCT 39.1 38.2 38.2 33.5*  MCV 85.2 85.5 86.2 86.3  PLT 204 199 200 474   Basic Metabolic Panel: Recent Labs  Lab 01/09/17 1350 01/12/17 1756 01/13/17 0436 01/14/17 0410 01/15/17 0408  NA  --  129* 129* 129* 132*  K  --  4.0 3.6 3.9 3.8  CL  --  93* 93* 92* 99*  CO2  --  23 25 24 23   GLUCOSE  --  114* 107* 102* 88  BUN  --  11 15 22* 17  CREATININE 0.70 0.75 1.09* 1.37* 0.92  CALCIUM  --  10.1 10.0 10.0 9.1   GFR: CrCl cannot be calculated (Unknown ideal weight.). Liver Function Tests: No results for input(s): AST, ALT, ALKPHOS, BILITOT, PROT, ALBUMIN in the last 168 hours. No results for input(s): LIPASE, AMYLASE in the last 168 hours. No results for input(s): AMMONIA in the last 168 hours. Coagulation Profile: No results for input(s): INR, PROTIME in the last 168 hours. Cardiac Enzymes: No results for input(s): CKTOTAL, CKMB, CKMBINDEX, TROPONINI in the last 168 hours. BNP (last 3 results) No results for input(s): PROBNP in the last 8760 hours. HbA1C: No results for input(s): HGBA1C in the last 72 hours. CBG: Recent Labs  Lab 01/09/17 1332  GLUCAP 81   Lipid Profile: No results for input(s): CHOL, HDL, LDLCALC, TRIG, CHOLHDL, LDLDIRECT in the last 72 hours. Thyroid Function Tests: Recent Labs    01/13/17 0436 01/13/17 1157  TSH 8.829*  --   FREET4  --  0.96   Anemia Panel: No results for input(s): VITAMINB12, FOLATE, FERRITIN, TIBC, IRON,  RETICCTPCT in the last 72 hours. Sepsis Labs: No results for input(s): PROCALCITON, LATICACIDVEN in the last 168 hours.  No results found for this or any previous visit (from the past 240 hour(s)).       Radiology Studies: Ct Pelvis Wo Contrast  Result Date: 01/13/2017 CLINICAL DATA:  Bone lesion noted on recent PET-CT, incompletely imaged. EXAM: CT PELVIS WITHOUT CONTRAST CT OF THE LEFT FEMUR WITHOUT CONTRAST TECHNIQUE: Multidetector CT imaging of the pelvis was performed following the standard protocol without intravenous contrast. COMPARISON:  PET-CT, 01/09/2017.  Left knee radiographs, 05/21/2015. FINDINGS: MUSCULOSKELETAL: Irregular lytic lesion in the left ilium, along the roof of the left acetabulum, measuring 2.3 cm. Lesion breaches the subcortical bone of the superior left acetabulum. No other pelvic lesions. Permeative lesion is seen within the cortex of the mid left femoral shaft, spanning approximately 6 cm in length, and expanding the  cortical thickness 2 7 mm. There is associated increased attenuation within the medullary cavity of the mid femur. No associated soft tissue mass. No other bone lesions. SOFT TISSUES: Within the pelvis, there are no acute abnormalities. Multiple left colon diverticula are noted without diverticulitis. Theresa Levine normal appendix is visualized. Bladder, uterus and adnexa are unremarkable. No adenopathy. No free fluid. Left lower extremity soft tissues show scattered femoral artery vascular calcifications. No masses or adenopathy. Normal attenuation from the 5 musculature. No knee joint effusion. IMPRESSION: 1. The lesion partly imaged on the recent PET-CT is Theresa Levine 6 cm long, permeative, cortically based bone lesion along the posterior mid shaft of the left femur, associated with increased attenuation in the medullary cavity. This is consistent with an additional focus of metastatic disease from breast carcinoma. 2. Metastatic, lytic/destructive lesion of the left ilium,  involving the superior acetabulum, with resorption of portions of the superior acetabular subchondral bone. 3. No other metastatic bone lesions on the included field of view. Electronically Signed   By: Theresa Manes M.D.   On: 01/13/2017 10:41   Ct Femur Left Wo Contrast  Result Date: 01/13/2017 CLINICAL DATA:  Bone lesion noted on recent PET-CT, incompletely imaged. EXAM: CT PELVIS WITHOUT CONTRAST CT OF THE LEFT FEMUR WITHOUT CONTRAST TECHNIQUE: Multidetector CT imaging of the pelvis was performed following the standard protocol without intravenous contrast. COMPARISON:  PET-CT, 01/09/2017.  Left knee radiographs, 05/21/2015. FINDINGS: MUSCULOSKELETAL: Irregular lytic lesion in the left ilium, along the roof of the left acetabulum, measuring 2.3 cm. Lesion breaches the subcortical bone of the superior left acetabulum. No other pelvic lesions. Permeative lesion is seen within the cortex of the mid left femoral shaft, spanning approximately 6 cm in length, and expanding the cortical thickness 2 7 mm. There is associated increased attenuation within the medullary cavity of the mid femur. No associated soft tissue mass. No other bone lesions. SOFT TISSUES: Within the pelvis, there are no acute abnormalities. Multiple left colon diverticula are noted without diverticulitis. Theresa Levine normal appendix is visualized. Bladder, uterus and adnexa are unremarkable. No adenopathy. No free fluid. Left lower extremity soft tissues show scattered femoral artery vascular calcifications. No masses or adenopathy. Normal attenuation from the 5 musculature. No knee joint effusion. IMPRESSION: 1. The lesion partly imaged on the recent PET-CT is Theresa Levine 6 cm long, permeative, cortically based bone lesion along the posterior mid shaft of the left femur, associated with increased attenuation in the medullary cavity. This is consistent with an additional focus of metastatic disease from breast carcinoma. 2. Metastatic, lytic/destructive lesion of  the left ilium, involving the superior acetabulum, with resorption of portions of the superior acetabular subchondral bone. 3. No other metastatic bone lesions on the included field of view. Electronically Signed   By: Theresa Manes M.D.   On: 01/13/2017 10:41        Scheduled Meds: . acetaminophen  1,000 mg Oral Q8H  . aspirin EC  81 mg Oral Daily  . feeding supplement (ENSURE ENLIVE)  237 mL Oral BID BM  . levothyroxine  25 mcg Oral QAC breakfast  . metoprolol succinate  100 mg Oral q1800  . nicotine  21 mg Transdermal Daily  . pantoprazole sodium  40 mg Oral Daily   Continuous Infusions: . sodium chloride 1,000 mL (01/14/17 1300)  . cefTRIAXone (ROCEPHIN)  IV       LOS: 3 days    Time spent: over 20 min    Fayrene Helper, MD [facility] Pager [phone]  If 7PM-7AM, please contact night-coverage [email or web address] Password [facility] Levine 01/15/2017, 9:36 AM

## 2017-01-15 NOTE — Procedures (Signed)
CT guided core biopsies of L4 lesion.  Multiple cores taken and 3 adequate cores obtained.  Specimens placed in formalin.  Minimal blood loss and no immediate complication.

## 2017-01-15 NOTE — Progress Notes (Signed)
    Subjective:    Patient reports pain as 3 on 0-10 scale.   Denies CP or SOB.  Voiding without difficulty. Positive flatus. Objective: Vital signs in last 24 hours: Temp:  [97.6 F (36.4 C)-98.1 F (36.7 C)] 97.6 F (36.4 C) (11/12 0502) Pulse Rate:  [73-97] 73 (11/12 0502) Resp:  [17-19] 17 (11/12 0502) BP: (95-137)/(71-97) 122/86 (11/12 0502) SpO2:  [96 %-100 %] 99 % (11/12 0502)  Intake/Output from previous day: 11/11 0701 - 11/12 0700 In: 1541.7 [P.O.:840; I.V.:701.7] Out: 1250 [Urine:1250] Intake/Output this shift: No intake/output data recorded.  Labs: Recent Labs    01/12/17 1756 01/13/17 0436 01/14/17 0410 01/15/17 0408  HGB 14.0 13.4 13.5 11.5*   Recent Labs    01/14/17 0410 01/15/17 0408  WBC 5.9 5.1  RBC 4.43 3.88  HCT 38.2 33.5*  PLT 200 173   Recent Labs    01/14/17 0410 01/15/17 0408  NA 129* 132*  K 3.9 3.8  CL 92* 99*  CO2 24 23  BUN 22* 17  CREATININE 1.37* 0.92  GLUCOSE 102* 88  CALCIUM 10.0 9.1   No results for input(s): LABPT, INR in the last 72 hours.  Physical Exam: Neurologically intact Intact pulses distally Compartment soft  Assessment/Plan:    CT guided biopsy today - pelvic lesion. Per oncology will order brain MRI to r/o mets  Will discuss management of femur with my partner - ? Need for prophylactic IM nail Continue NPO    Theresa Levine D for Dr. Melina Schools [facility] [phone] 01/15/2017, 8:04 AM

## 2017-01-16 DIAGNOSIS — M899 Disorder of bone, unspecified: Secondary | ICD-10-CM

## 2017-01-16 DIAGNOSIS — M25552 Pain in left hip: Secondary | ICD-10-CM

## 2017-01-16 DIAGNOSIS — Z515 Encounter for palliative care: Secondary | ICD-10-CM

## 2017-01-16 DIAGNOSIS — C801 Malignant (primary) neoplasm, unspecified: Secondary | ICD-10-CM

## 2017-01-16 DIAGNOSIS — G893 Neoplasm related pain (acute) (chronic): Secondary | ICD-10-CM

## 2017-01-16 DIAGNOSIS — Z7189 Other specified counseling: Secondary | ICD-10-CM

## 2017-01-16 LAB — CBC
HEMATOCRIT: 32.1 % — AB (ref 36.0–46.0)
Hemoglobin: 11 g/dL — ABNORMAL LOW (ref 12.0–15.0)
MCH: 29.8 pg (ref 26.0–34.0)
MCHC: 34.3 g/dL (ref 30.0–36.0)
MCV: 87 fL (ref 78.0–100.0)
PLATELETS: 159 10*3/uL (ref 150–400)
RBC: 3.69 MIL/uL — AB (ref 3.87–5.11)
RDW: 15.5 % (ref 11.5–15.5)
WBC: 5.1 10*3/uL (ref 4.0–10.5)

## 2017-01-16 LAB — BASIC METABOLIC PANEL
ANION GAP: 9 (ref 5–15)
BUN: 10 mg/dL (ref 6–20)
CHLORIDE: 102 mmol/L (ref 101–111)
CO2: 22 mmol/L (ref 22–32)
Calcium: 8.9 mg/dL (ref 8.9–10.3)
Creatinine, Ser: 0.77 mg/dL (ref 0.44–1.00)
Glucose, Bld: 74 mg/dL (ref 65–99)
Potassium: 4.7 mmol/L (ref 3.5–5.1)
SODIUM: 133 mmol/L — AB (ref 135–145)

## 2017-01-16 MED ORDER — SENNA 8.6 MG PO TABS
2.0000 | ORAL_TABLET | Freq: Every day | ORAL | Status: DC
  Administered 2017-01-16 – 2017-01-19 (×4): 17.2 mg via ORAL
  Filled 2017-01-16 (×5): qty 2

## 2017-01-16 MED ORDER — ENOXAPARIN SODIUM 40 MG/0.4ML ~~LOC~~ SOLN
40.0000 mg | SUBCUTANEOUS | Status: DC
  Administered 2017-01-16 – 2017-01-19 (×4): 40 mg via SUBCUTANEOUS
  Filled 2017-01-16 (×4): qty 0.4

## 2017-01-16 MED ORDER — FENTANYL 25 MCG/HR TD PT72
25.0000 ug | MEDICATED_PATCH | TRANSDERMAL | Status: DC
  Administered 2017-01-16: 25 ug via TRANSDERMAL
  Filled 2017-01-16: qty 1

## 2017-01-16 MED ORDER — BISACODYL 10 MG RE SUPP
10.0000 mg | Freq: Every day | RECTAL | Status: DC | PRN
  Filled 2017-01-16: qty 1

## 2017-01-16 NOTE — Consult Note (Signed)
ORTHOPAEDIC CONSULTATION  REQUESTING PHYSICIAN: Elodia Florence., *  PCP:  Aletha Halim., PA-C  Chief Complaint: Left impending pathologic fracture of the femur.  HPI: Manuel Lawhead is a 66 y.o. female who complains of Left-sided low back pain as well as left hip pain and mild left thigh pain.  Ms. Sahagian has an unfortunate recent history of increasing low back pain and L4 and L3 radicular pain with weakness of the left leg.  This precipitated some advanced imaging of the lumbar spine which demonstrated a lesion in the L4 vertebral body.  She was admitted to the hospital for workup of what has now become known as metastatic disease in the left femur, left acetabulum, lumbar spine, as well as some lymphadenopathy.  She has a history of breast cancer years ago that  Was thought to be in remission.  She has had a recent interventional radiology biopsy obtained of the lumbar spine which is currently awaiting final pathology.   I was asked to see this patient in consultation for recommendations regarding her impending left femur fracture.  She currently is not very ambulatory but does a light without assistive devices throughout her house and around locally.  She has been very sedentary over the last few days secondary to the back pain.  She also endorses some weakness as well as radicular pain in the left leg.  Weakness is especially noticeable with hip flexion and knee extension.  Past Medical History:  Diagnosis Date  . Anxiety   . Breast cancer ([place])   . Depression   . FHx: chemotherapy 1998   Radiation left in chest in 2008  . Gastroesophageal reflux disease   . History of percutaneous left heart catheterization September 2009   This showed only luminal irregularities, EF was 60%   . History of stress test    ETT 6/17: Poor Ex capacity, Ex 3' (4.6 METs), no ischemic ST changes, hypertensive BP response // ETT-Echo 8/17: normal  . Hyperlipidemia    unable to tolerate  Crestor, Lipitor, or pravastatin  . Hypertension   . Hypothyroidism   . Mild mitral regurgitation by prior echocardiogram September 2009   Showed an EF of 55-60%  . Palpitations   . Supraventricular tachycardia ([place])    The patient had an episode of SVT in September 2009. Her heart rate was reported  to be around 240 by EMS., However, there were no strips to evaluate. It did terminate with  adenosine. 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 The patient did have an event monitor done in October 2009 that showed only normal sinus rhythm.   . Tobacco user    Past Surgical History:  Procedure Laterality Date  . CARDIAC CATHETERIZATION  12/02/2006   Eustace Quail, MD. Lumpectomy and radical nide dissection of the left breast  . MASTECTOMY  1997   Social History   Socioeconomic History  . Marital status: Divorced    Spouse name: None  . Number of children: None  . Years of education: None  . Highest education level: None  Social Needs  . Financial resource strain: None  . Food insecurity - worry: None  . Food insecurity - inability: None  . Transportation needs - medical: None  . Transportation needs - non-medical: None  Occupational History  . Occupation: Occupational hygienist  Tobacco Use  . Smoking status: Current Every Day Smoker    Packs/day: 1.00    Years: 40.00    Pack years: 40.00  Types: Cigarettes  . Smokeless tobacco: Never Used  Substance and Sexual Activity  . Alcohol use: Yes    Alcohol/week: 0.0 oz    Comment: Minimal  . Drug use: No  . Sexual activity: None  Other Topics Concern  . None  Social History Narrative  . None   Family History  Problem Relation Age of Onset  . Heart attack Father 34  . Esophageal cancer Brother   . Fainting Sister        Had a pacemaker put in  . Stroke Paternal Grandfather    Allergies  Allergen Reactions  . Crestor [Rosuvastatin Calcium] Other (See Comments)    Myalgias  . Ezetimibe     Other reaction(s): GI Upset  (intolerance)  . Rosuvastatin Calcium     Other reaction(s): Other (See Comments) Myalgias  . Sulfa Antibiotics   . Sulfacetamide Sodium     Other reaction(s): Other (See Comments)   Prior to Admission medications   Medication Sig Start Date End Date Taking? Authorizing Provider  acetaminophen (TYLENOL) 500 MG tablet Take 1,000 mg every 2 (two) hours as needed by mouth for mild pain or moderate pain.   Yes [provider]  ALPRAZolam (XANAX) 0.25 MG tablet Take 0.25 mg by mouth at bedtime as needed for anxiety.   Yes [provider]  aspirin 81 MG tablet Take 81 mg by mouth daily.   Yes [provider]  esomeprazole (NEXIUM) 20 MG capsule Take 40 mg daily at 12 noon by mouth.   Yes [provider]  HYDROcodone-acetaminophen (NORCO/VICODIN) 5-325 MG tablet Take 1 tablet every 2 (two) hours as needed by mouth. 01/05/17  Yes [provider]  levothyroxine (LEVOTHROID) 25 MCG tablet Take 25 mcg by mouth daily.     Yes [provider]  losartan (COZAAR) 50 MG tablet Take 1 tablet (50 mg total) by mouth daily. 12/05/16  Yes Weaver, Scott T, PA-C  metoprolol succinate (TOPROL-XL) 100 MG 24 hr tablet Take 1 tablet (100 mg total) by mouth daily. Take with or immediately following a meal. Patient taking differently: Take 100 mg at bedtime by mouth. Take with or immediately following a meal.  12/20/16  Yes Weaver, Scott T, PA-C  traMADol (ULTRAM) 50 MG tablet Take 50 mg every 2 (two) hours as needed by mouth. 12/25/16  Yes [provider]  meclizine (ANTIVERT) 25 MG tablet Take one every 8 hours as needed for dizziness/vertigo Patient not taking: Reported on 01/12/2017 11/16/10   Larey Dresser, MD   Mr Brain W Wo Contrast  Result Date: 01/15/2017 CLINICAL DATA:  Staging.  Metastatic disease.  Breast cancer. EXAM: MRI HEAD WITHOUT AND WITH CONTRAST TECHNIQUE: Multiplanar, multiecho pulse sequences of the brain and surrounding structures were  obtained without and with intravenous contrast. CONTRAST:  33mL MULTIHANCE GADOBENATE DIMEGLUMINE 529 MG/ML IV SOLN COMPARISON:  PET scan from 01/09/2017 demonstrates osseous metastatic disease to the lumbar spine and pelvis FINDINGS: Brain: No evidence for acute infarction, hemorrhage, mass lesion, hydrocephalus, or extra-axial fluid. Mild atrophy is noted. Mild subcortical and periventricular T2 and FLAIR hyperintensities, likely chronic microvascular ischemic change. Post infusion images are motion degraded, but overall, diagnostic. Post infusion, no abnormal enhancement of the brain or meninges. Vascular: Flow voids are maintained throughout the carotid, basilar, and vertebral arteries. There are no areas of chronic hemorrhage. Skull and upper cervical spine: Unremarkable visualized calvarium, skullbase, and cervical vertebrae. Pituitary, pineal, cerebellar tonsils unremarkable. No upper cervical cord lesions. Sinuses/Orbits: Hypoplastic RIGHT  maxillary sinus. No layering fluid. Negative orbits. Other: None. IMPRESSION: Mild atrophy and small vessel disease. No acute intracranial findings. Specifically, no evidence for intracranial metastatic disease. Electronically Signed   By: Staci Righter M.D.   On: 01/15/2017 12:14   Ct Biopsy  Result Date: 01/15/2017 INDICATION: 66 year old female with history of breast cancer. Patient now has multiple destructive bone lesions and needs a tissue diagnosis. EXAM: CT-GUIDED CORE BIOPSIES OF THE L4 LESION MEDICATIONS: None. ANESTHESIA/SEDATION: Moderate (conscious) sedation was employed during this procedure. A total of Versed 3.0 mg and Fentanyl 100 mcg was administered intravenously. Moderate Sedation Time: 22 minutes. The patient's level of consciousness and vital signs were monitored continuously by radiology nursing throughout the procedure under my direct supervision. FLUOROSCOPY TIME:  None COMPLICATIONS: None immediate. PROCEDURE: Informed written consent was  obtained from the patient after a thorough discussion of the procedural risks, benefits and alternatives. All questions were addressed. A timeout was performed prior to the initiation of the procedure. Patient was placed prone. CT images through the lower abdomen were obtained. The lesion involving the left side of the L4 vertebral body was targeted. The patient's back was prepped with chlorhexidine and sterile field was created. Skin and soft tissues were anesthetized with 1% lidocaine. 17 gauge coaxial needle was directed onto the posterior aspect of the L4 left transverse process. Multiple core biopsies were obtained. Three adequate samples were obtained and placed in formalin. 17 gauge needle was removed without complication. FINDINGS: Destructive soft tissue lesion involving the left side of the L4 vertebral body including the left transverse process. Core biopsies were obtained of the L4 left transverse process. IMPRESSION: CT-guided core biopsies of the L4 lesion. Electronically Signed   By: Markus Daft M.D.   On: 01/15/2017 15:55    Positive ROS: All other systems have been reviewed and were otherwise negative with the exception of those mentioned in the HPI and as above.  Physical Exam: General: Alert, no acute distress Cardiovascular: No pedal edema Respiratory: No cyanosis, no use of accessory musculature GI: No organomegaly, abdomen is soft and non-tender Skin: No lesions in the area of chief complaint Neurologic: Sensation intact distally Psychiatric: Patient is competent for consent with normal mood and affect Lymphatic: No axillary or cervical lymphadenopathy  MUSCULOSKELETAL:  Focal examination of left leg:  She has no tenderness to palpation at the midshaft or distal femur.  She does have some tenderness along the gluteal region posteriorly.  Otherwise distally she has some weakness noted with L3 and L4 nerve root testing.  Dysesthesias noted in the L4 disc.  Intact light touch  otherwise.  2+ dorsalis pedis pulse.  At the knee she has preservation of symmetric range of motion.  No joint line tenderness.  Assessment: 1. Impending left femur pathologic fracture. 2.  Metastatic carcinoma, likely recurrence of breast carcinoma.  Plan: - agree with biopsy and tissue diagnosis.  I will follow-up with the tissue diagnosis to ensure we have a known metastatic lesion as opposed to a primary bone lesion which does seem less likely.  In the unlikely scenario that this is a new carcinoma such as renal cell carcinoma we would like to know that before operative intervention so that we can proceed with preoperative embolization of the lesion.  - I discussed at length with the family my recommendation for prophylactic surgical fixation of the impending left femur fracture.  Based on Meril's criteria she is indicated for fixation.  I discussed with her that I cannot  put a percentage risk on the chance of her fracturing the femur, however I do not think that the morbidity of surgery at this point would be very great and I think her recovery would be fairly smooth.  We will allow her to weight-bear as tolerated immediately.  She is going consider this but she would like to get a tissue diagnosis before making her decision.  I have outlined the risks, benefits and indications for operative intervention.  - In the immediate future I think if she wants to discharge home prior to surgical fixation if she chooses to go that route that is reasonable.  Dr. Rolena Infante is outlined a good plan of care for the lumbar metastatic lesion and I happy to see her on Monday in the office to review the pathology and before the surgical fixation if that is her desire.  - I would recommend protected weightbearing of the left lower, just 50% weightbearing allowed to the left lower with a walker.  Should she choose to stay in the hospital until final pathology returns I will be happy to perform surgical intervention next  week on Monday or Tuesday.  - Thank you for the opportunity to participate in Miss Farino's care.    Nicholes Stairs, MD Cell [phone]    01/16/2017 2:22 PM

## 2017-01-16 NOTE — Progress Notes (Signed)
PROGRESS NOTE    Theresa Levine [facility]  PXT:[id number] DOB: [birth year] DOA: 01/12/2017 PCP: Aletha Halim., PA-C    Brief Narrative: (Start on day 1 of progress note - keep it brief and live) Theresa Levine is Theresa Levine 66 y.o. female with medical history significant of breast cancer (received radiation to the left side of the chest in 2008 - she noted to me she received chemo/radiation/lumpectomy with last therapy being in the 90's), SVT, HTN, HLD, has been seen in ortho specialist office Dr. Rolena Infante for evaluation of right hip pain that has been getting progressively worse over the past week. Patient describes pain as constant and sharp, worse with weight bearing and associated with occasional cramping, numbness in the right hip area. Patient reports trying vicodin but it has not helped with pain. Patient was found to have metastatic lesion in the left femur with impeding fracture. Plan was for oncology to see prior to deciding on surgery.   Assessment & Plan:   Principal Problem:   Breast cancer metastasized to bone/Bone metastases/Bony metastasis/H/o Breast Cancer Active Problems:   Back pain/Leg pain   Pain   Bone lesions highly suspicious for metastatic lesions in pt with known hx of breast cancer  - recent imaging studies notable for large lesion in the left acetabulum and distal left femur with ? Impeding fracture  - Discussed with oncology, appreciate recs - Dr. Rolena Infante rec TLSO brace  [ ]  to discuss management of femur with partner and possible need for IM nail prophylactically (discussed with Dr. Rolena Infante today that we're still waiting on formal recommendations from this standpoint). - Ortho following - Patient c/o pain uncontrolled.  Will add fentanyl patch and see how she does with this.   - non weight bearing for now (will need to clarify recommendations if patient does not pursue surgery) [ ]  CT guided biopsy today for pelvic region done 11/12 [ ]  concern for short term memory  problems, MRI brain negative for metastatic disease [ ]  Will have palliative medicine see her to assist with goals of care and pain control.   Hyponatremia:  Improving with IVF - urine osm (374), urine Na (30), serum osm (274) - mild, ctm for now  Dysuria:  UA with pyuria, hematuria, nitrite positive.  Add on urine culture.  Ceftriaxone.   AKI:  Improved today. Will continue to hold losartan.  Continue IVF   F/u Urinalysis.   HTN, but pt recently with low BP's - Has had low BP's while inpatient, overall improved.  Maybe related to opiates?  Watch closely while adjusting pain medicines.  - holding losartan - continue metoprolol    Hypothyroidism - continue synthroid, increase to 37.5 mcg, f/u in about 6 weeks repeat TSH    Smoker - provide nicotine patch   DVT prophylaxis: lovenox Code Status: full  Family Communication: none at bedside Disposition Plan: pending   Consultants:   Ortho, Oncology  Procedures: (Don't include imaging studies which can be auto populated. Include things that cannot be auto populated i.e. Echo, Carotid and venous dopplers, Foley, Bipap, HD, tubes/drains, wound vac, central lines etc)  none  Antimicrobials: (specify start and planned stop date. Auto populated tables are space occupying and do not give end dates)  none    Subjective: Wants to know what this is before going forward with surgery. Would be interested in palliative care.    Objective: Vitals:   01/15/17 1905 01/15/17 2022 01/16/17 0423 01/16/17 0930  BP: 104/75 122/88 114/69  105/65  Pulse: 87 82 70 60  Resp:  18 18 16   Temp:  98 F (36.7 C) 98.4 F (36.9 C) (!) 97.1 F (36.2 C)  TempSrc:  Oral Oral Oral  SpO2:  100% 100% 100%    Intake/Output Summary (Last 24 hours) at 01/16/2017 1416 Last data filed at 01/16/2017 1326 Gross per 24 hour  Intake 120 ml  Output 2050 ml  Net -1930 ml   There were no vitals filed for this visit.  Examination:  General: No acute  distress. Cardiovascular: Heart sounds show Theresa Levine regular rate, and rhythm. No gallops or rubs. No murmurs. No JVD. Lungs: Clear to auscultation bilaterally with good air movement. No rales, rhonchi or wheezes. Abdomen: Soft, nontender, nondistended with normal active bowel sounds. No masses. No hepatosplenomegaly. Neurological: Alert and oriented 3. Moves all extremities 4 with equal strength. Cranial nerves II through XII grossly intact. Skin: Warm and dry. No rashes or lesions. Extremities: No clubbing or cyanosis. No edema. Pedal pulses 2+. Psychiatric: Mood and affect are normal. Insight and judgment are appropriate.  Data Reviewed: I have personally reviewed following labs and imaging studies  CBC: Recent Labs  Lab 01/12/17 1756 01/13/17 0436 01/14/17 0410 01/15/17 0408 01/16/17 0507  WBC 7.2 6.5 5.9 5.1 5.1  HGB 14.0 13.4 13.5 11.5* 11.0*  HCT 39.1 38.2 38.2 33.5* 32.1*  MCV 85.2 85.5 86.2 86.3 87.0  PLT 204 199 200 173 244   Basic Metabolic Panel: Recent Labs  Lab 01/12/17 1756 01/13/17 0436 01/14/17 0410 01/15/17 0408 01/16/17 0408  NA 129* 129* 129* 132* 133*  K 4.0 3.6 3.9 3.8 4.7  CL 93* 93* 92* 99* 102  CO2 23 25 24 23 22   GLUCOSE 114* 107* 102* 88 74  BUN 11 15 22* 17 10  CREATININE 0.75 1.09* 1.37* 0.92 0.77  CALCIUM 10.1 10.0 10.0 9.1 8.9   GFR: CrCl cannot be calculated (Unknown ideal weight.). Liver Function Tests: No results for input(s): AST, ALT, ALKPHOS, BILITOT, PROT, ALBUMIN in the last 168 hours. No results for input(s): LIPASE, AMYLASE in the last 168 hours. No results for input(s): AMMONIA in the last 168 hours. Coagulation Profile: Recent Labs  Lab 01/15/17 0920  INR 1.04   Cardiac Enzymes: No results for input(s): CKTOTAL, CKMB, CKMBINDEX, TROPONINI in the last 168 hours. BNP (last 3 results) No results for input(s): PROBNP in the last 8760 hours. HbA1C: No results for input(s): HGBA1C in the last 72 hours. CBG: No results for  input(s): GLUCAP in the last 168 hours. Lipid Profile: No results for input(s): CHOL, HDL, LDLCALC, TRIG, CHOLHDL, LDLDIRECT in the last 72 hours. Thyroid Function Tests: No results for input(s): TSH, T4TOTAL, FREET4, T3FREE, THYROIDAB in the last 72 hours. Anemia Panel: No results for input(s): VITAMINB12, FOLATE, FERRITIN, TIBC, IRON, RETICCTPCT in the last 72 hours. Sepsis Labs: No results for input(s): PROCALCITON, LATICACIDVEN in the last 168 hours.  No results found for this or any previous visit (from the past 240 hour(s)).       Radiology Studies: Mr Jeri Cos WN Contrast  Result Date: 01/15/2017 CLINICAL DATA:  Staging.  Metastatic disease.  Breast cancer. EXAM: MRI HEAD WITHOUT AND WITH CONTRAST TECHNIQUE: Multiplanar, multiecho pulse sequences of the brain and surrounding structures were obtained without and with intravenous contrast. CONTRAST:  43mL MULTIHANCE GADOBENATE DIMEGLUMINE 529 MG/ML IV SOLN COMPARISON:  PET scan from 01/09/2017 demonstrates osseous metastatic disease to the lumbar spine and pelvis FINDINGS: Brain: No evidence for acute infarction,  hemorrhage, mass lesion, hydrocephalus, or extra-axial fluid. Mild atrophy is noted. Mild subcortical and periventricular T2 and FLAIR hyperintensities, likely chronic microvascular ischemic change. Post infusion images are motion degraded, but overall, diagnostic. Post infusion, no abnormal enhancement of the brain or meninges. Vascular: Flow voids are maintained throughout the carotid, basilar, and vertebral arteries. There are no areas of chronic hemorrhage. Skull and upper cervical spine: Unremarkable visualized calvarium, skullbase, and cervical vertebrae. Pituitary, pineal, cerebellar tonsils unremarkable. No upper cervical cord lesions. Sinuses/Orbits: Hypoplastic RIGHT maxillary sinus. No layering fluid. Negative orbits. Other: None. IMPRESSION: Mild atrophy and small vessel disease. No acute intracranial findings.  Specifically, no evidence for intracranial metastatic disease. Electronically Signed   By: Staci Righter M.D.   On: 01/15/2017 12:14   Ct Biopsy  Result Date: 01/15/2017 INDICATION: 66 year old female with history of breast cancer. Patient now has multiple destructive bone lesions and needs Treyshon Buchanon tissue diagnosis. EXAM: CT-GUIDED CORE BIOPSIES OF THE L4 LESION MEDICATIONS: None. ANESTHESIA/SEDATION: Moderate (conscious) sedation was employed during this procedure. Janaki Exley total of Versed 3.0 mg and Fentanyl 100 mcg was administered intravenously. Moderate Sedation Time: 22 minutes. The patient's level of consciousness and vital signs were monitored continuously by radiology nursing throughout the procedure under my direct supervision. FLUOROSCOPY TIME:  None COMPLICATIONS: None immediate. PROCEDURE: Informed written consent was obtained from the patient after Tyshon Fanning thorough discussion of the procedural risks, benefits and alternatives. All questions were addressed. Yaslene Lindamood timeout was performed prior to the initiation of the procedure. Patient was placed prone. CT images through the lower abdomen were obtained. The lesion involving the left side of the L4 vertebral body was targeted. The patient's back was prepped with chlorhexidine and sterile field was created. Skin and soft tissues were anesthetized with 1% lidocaine. 17 gauge coaxial needle was directed onto the posterior aspect of the L4 left transverse process. Multiple core biopsies were obtained. Three adequate samples were obtained and placed in formalin. 17 gauge needle was removed without complication. FINDINGS: Destructive soft tissue lesion involving the left side of the L4 vertebral body including the left transverse process. Core biopsies were obtained of the L4 left transverse process. IMPRESSION: CT-guided core biopsies of the L4 lesion. Electronically Signed   By: Markus Daft M.D.   On: 01/15/2017 15:55        Scheduled Meds: . acetaminophen  1,000 mg Oral  Q8H  . aspirin EC  81 mg Oral Daily  . feeding supplement (ENSURE ENLIVE)  237 mL Oral BID BM  . fentaNYL  25 mcg Transdermal Q72H  . levothyroxine  37.5 mcg Oral QAC breakfast  . metoprolol succinate  100 mg Oral q1800  . nicotine  21 mg Transdermal Daily  . pantoprazole sodium  40 mg Oral Daily   Continuous Infusions: . sodium chloride 100 mL/hr at 01/16/17 1406  . cefTRIAXone (ROCEPHIN)  IV Stopped (01/16/17 1108)     LOS: 4 days    Time spent: over 20 min    Fayrene Helper, MD [facility] Pager [phone]   If 7PM-7AM, please contact night-coverage [email or web address] Password [email or web address] 01/16/2017, 2:16 PM

## 2017-01-16 NOTE — Progress Notes (Signed)
IP PROGRESS NOTE  Subjective:   No changes noted overnight.  She underwent biopsy on 78/93/8101 without complications.  Her pain is reasonable at this time and is able to ambulate with the help of a walker with assistance.  Objective:  Vital signs in last 24 hours: Temp:  [97.1 F (36.2 C)-98.4 F (36.9 C)] 97.1 F (36.2 C) (11/13 0930) Pulse Rate:  [60-87] 60 (11/13 0930) Resp:  [13-19] 16 (11/13 0930) BP: (94-128)/(64-99) 105/65 (11/13 0930) SpO2:  [98 %-100 %] 100 % (11/13 0930) Weight change:  Last BM Date: 01/12/17  Intake/Output from previous day: 11/12 0701 - 11/13 0700 In: -  Out: 1200 [Urine:1200] Alert, awake woman without distress. Mouth: mucous membranes moist, pharynx normal without lesions Resp: clear to auscultation bilaterally Cardio: regular rate and rhythm, S1, S2 normal, no murmur, click, rub or gallop GI: soft, non-tender; bowel sounds normal; no masses,  no organomegaly Extremities: extremities normal, atraumatic, no cyanosis or edema  Portacath/PICC-without erythema  Lab Results: Recent Labs    01/15/17 0408 01/16/17 0507  WBC 5.1 5.1  HGB 11.5* 11.0*  HCT 33.5* 32.1*  PLT 173 159    BMET Recent Labs    01/15/17 0408 01/16/17 0408  NA 132* 133*  K 3.8 4.7  CL 99* 102  CO2 23 22  GLUCOSE 88 74  BUN 17 10  CREATININE 0.92 0.77  CALCIUM 9.1 8.9    Studies/Results: Mr Jeri Cos BP Contrast  Result Date: 01/15/2017 CLINICAL DATA:  Staging.  Metastatic disease.  Breast cancer. EXAM: MRI HEAD WITHOUT AND WITH CONTRAST TECHNIQUE: Multiplanar, multiecho pulse sequences of the brain and surrounding structures were obtained without and with intravenous contrast. CONTRAST:  61mL MULTIHANCE GADOBENATE DIMEGLUMINE 529 MG/ML IV SOLN COMPARISON:  PET scan from 01/09/2017 demonstrates osseous metastatic disease to the lumbar spine and pelvis FINDINGS: Brain: No evidence for acute infarction, hemorrhage, mass lesion, hydrocephalus, or extra-axial  fluid. Mild atrophy is noted. Mild subcortical and periventricular T2 and FLAIR hyperintensities, likely chronic microvascular ischemic change. Post infusion images are motion degraded, but overall, diagnostic. Post infusion, no abnormal enhancement of the brain or meninges. Vascular: Flow voids are maintained throughout the carotid, basilar, and vertebral arteries. There are no areas of chronic hemorrhage. Skull and upper cervical spine: Unremarkable visualized calvarium, skullbase, and cervical vertebrae. Pituitary, pineal, cerebellar tonsils unremarkable. No upper cervical cord lesions. Sinuses/Orbits: Hypoplastic RIGHT maxillary sinus. No layering fluid. Negative orbits. Other: None. IMPRESSION: Mild atrophy and small vessel disease. No acute intracranial findings. Specifically, no evidence for intracranial metastatic disease. Electronically Signed   By: Staci Righter M.D.   On: 01/15/2017 12:14   Ct Biopsy  Result Date: 01/15/2017 INDICATION: 66 year old female with history of breast cancer. Patient now has multiple destructive bone lesions and needs a tissue diagnosis. EXAM: CT-GUIDED CORE BIOPSIES OF THE L4 LESION MEDICATIONS: None. ANESTHESIA/SEDATION: Moderate (conscious) sedation was employed during this procedure. A total of Versed 3.0 mg and Fentanyl 100 mcg was administered intravenously. Moderate Sedation Time: 22 minutes. The patient's level of consciousness and vital signs were monitored continuously by radiology nursing throughout the procedure under my direct supervision. FLUOROSCOPY TIME:  None COMPLICATIONS: None immediate. PROCEDURE: Informed written consent was obtained from the patient after a thorough discussion of the procedural risks, benefits and alternatives. All questions were addressed. A timeout was performed prior to the initiation of the procedure. Patient was placed prone. CT images through the lower abdomen were obtained. The lesion involving the left side of the L4  vertebral  body was targeted. The patient's back was prepped with chlorhexidine and sterile field was created. Skin and soft tissues were anesthetized with 1% lidocaine. 17 gauge coaxial needle was directed onto the posterior aspect of the L4 left transverse process. Multiple core biopsies were obtained. Three adequate samples were obtained and placed in formalin. 17 gauge needle was removed without complication. FINDINGS: Destructive soft tissue lesion involving the left side of the L4 vertebral body including the left transverse process. Core biopsies were obtained of the L4 left transverse process. IMPRESSION: CT-guided core biopsies of the L4 lesion. Electronically Signed   By: Markus Daft M.D.   On: 01/15/2017 15:55    Medications: I have reviewed the patient's current medications.  Assessment/Plan:  66 year old woman with the following issues:  1.  Metastatic malignancy involving multiple bony areas: She has a large destructive lumbar vertebral lesion at the level of L4, sternal metastasis, left acetabulum and distal femur.  Small hypermetabolic nodules noted in the supraclavicular area including subpectoral space and inferior right hilum.  She is status post biopsy completed on 01/15/2017 of her L4 lesion.  The biopsy results are currently pending.    The differential diagnosis was discussed again with the patient today.  Her previous history of breast cancer is a possibility.  Her tumor was ER PR negative and required systemic chemotherapy adjuvantly.  The natural course of advanced malignancy regardless of the primary tumor was discussed today.  I reiterated the fact that her disease is incurable but would be likely treatable with palliative chemotherapy.  Her prognosis will be dependent on the pathology but I do not anticipate long-term disease survival associated with this advanced malignancy.  I anticipate her life expectancy close to 6 months without treatment may be longer with systemic chemotherapy  to reach close to 12 months.  I do not believe that she is eminently dying from her cancer but certainly I do not anticipate long-term survival either.  She is still struggling with the decision to proceed with surgery on her right femur.  I encouraged her to do so given the devastating nature of a pathological fracture at that time.  Should she elect not to proceed with that operation, I would advise proceeding with palliative radiation therapy and possibly hospice.  I agree with palliative medicine evaluation to help her clarify goals of care.  2. Pain: Appears to be manageable at this time.      LOS: 4 days   Zola Button 01/16/2017, 12:47 PM

## 2017-01-16 NOTE — Care Management Important Message (Signed)
Important Message  Patient Details  Name: Theresa Levine MRN: [id number] Date of Birth: [birth year]   Medicare Important Message Given:  Yes    Kerin Salen 01/16/2017, 2:12 [place] Message  Patient Details  Name: Theresa Levine MRN: [id number] Date of Birth: [birth year]   Medicare Important Message Given:  Yes    Kerin Salen 01/16/2017, 2:12 PM

## 2017-01-16 NOTE — Consult Note (Signed)
Consultation Note Date: 01/16/2017   Patient Name: Theresa Levine  DOB: January 09, 1951  MRN: [id number]  Age / Sex: 66 y.o., female  PCP: Theresa Levine., PA-C Referring Physician: Elodia Levine., *  Reason for Consultation: Pain control  HPI/Patient Profile: 66 y.o. female admitted on 01/12/2017   Clinical Assessment and Goals of Care:  Theresa Levine [facility] a 66 y.o.femalewith medical history significant ofbreast cancer (received radiation to the left side of the chest in 2008 - she noted to me she received chemo/radiation/lumpectomy with last therapy being in the 90's), SVT, HTN, HLD, has been seen in ortho specialist office Dr. Rolena Levine for evaluation of right hip pain that had been getting progressively worse over the past week.   Patient has since been diagnosed with bone lesions highly suspicious for metastatic lesions, large lesion in L acetabulum and distal L femur, possible impending fracture.   Orthopedic and medical oncology specialists are following, patient underwent biopsy, results pending.   A palliative consult has been requested for pain management and for goals of care discussions.   The patient is sitting by the side of the bed, she states that she sat on the commode for a long time this afternoon, she did not have a bowel movement, her last bowel movement was several days ago. Pain is reasonably controlled today so far, some what exacerbated by pain in her hip from sitting on the commode recently.   I introduced myself and palliative care as follows: Palliative medicine is specialized medical care for people living with serious illness. It focuses on providing relief from the symptoms and stress of a serious illness. The goal is to improve quality of life for both the patient and the family.  We discussed about pain and non pain symptom management, see recommendations below,  thank you for the consult.   NEXT OF KIN  daughter who is at the bedside.   SUMMARY OF RECOMMENDATIONS    1. Add bowel regimen 2. Continue with current pain regimen, patient encouraged to ask for PRNs, may need to uptitrate fentanyl patch soon.  3. Await biopsy results for further goals of care discussions.   Code Status/Advance Care Planning:  Full code    Symptom Management:    as above   Palliative Prophylaxis:   Bowel Regimen  Additional Recommendations (Limitations, Scope, Preferences):  Full Scope Treatment  Psycho-social/Spiritual:   Desire for further Chaplaincy support:yes  Additional Recommendations: Caregiving  Support/Resources  Prognosis:   Unable to determine  Discharge Planning: To Be Determined      Primary Diagnoses: Present on Admission: . Breast cancer metastasized to bone/Bone metastases/Bony metastasis/H/o Breast Cancer . Back pain/Leg pain . Pain   I have reviewed the medical record, interviewed the patient and family, and examined the patient. The following aspects are pertinent.  Past Medical History:  Diagnosis Date  . Anxiety   . Breast cancer ([place])   . Depression   . FHx: chemotherapy 1998   Radiation left in chest in 2008  .  Gastroesophageal reflux disease   . History of percutaneous left heart catheterization September 2009   This showed only luminal irregularities, EF was 60%   . History of stress test    ETT 6/17: Poor Ex capacity, Ex 3' (4.6 METs), no ischemic ST changes, hypertensive BP response // ETT-Echo 8/17: normal  . Hyperlipidemia    unable to tolerate Crestor, Lipitor, or pravastatin  . Hypertension   . Hypothyroidism   . Mild mitral regurgitation by prior echocardiogram September 2009   Showed an EF of 55-60%  . Palpitations   . Supraventricular tachycardia ([place])    The patient had an episode of SVT in September 2009. Her heart rate was reported  to be around 240 by EMS., However, there were no strips to  evaluate. It did terminate with  adenosine. 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 The patient did have an event monitor done in October 2009 that showed only normal sinus rhythm.   . Tobacco user    Social History   Socioeconomic History  . Marital status: Divorced    Spouse name: None  . Number of children: None  . Years of education: None  . Highest education level: None  Social Needs  . Financial resource strain: None  . Food insecurity - worry: None  . Food insecurity - inability: None  . Transportation needs - medical: None  . Transportation needs - non-medical: None  Occupational History  . Occupation: Occupational hygienist  Tobacco Use  . Smoking status: Current Every Day Smoker    Packs/day: 1.00    Years: 40.00    Pack years: 40.00    Types: Cigarettes  . Smokeless tobacco: Never Used  Substance and Sexual Activity  . Alcohol use: Yes    Alcohol/week: 0.0 oz    Comment: Minimal  . Drug use: No  . Sexual activity: None  Other Topics Concern  . None  Social History Narrative  . None   Family History  Problem Relation Age of Onset  . Heart attack Father 76  . Esophageal cancer Brother   . Fainting Sister        Had a pacemaker put in  . Stroke Paternal Grandfather    Scheduled Meds: . acetaminophen  1,000 mg Oral Q8H  . aspirin EC  81 mg Oral Daily  . feeding supplement (ENSURE ENLIVE)  237 mL Oral BID BM  . fentaNYL  25 mcg Transdermal Q72H  . levothyroxine  37.5 mcg Oral QAC breakfast  . metoprolol succinate  100 mg Oral q1800  . nicotine  21 mg Transdermal Daily  . pantoprazole sodium  40 mg Oral Daily  . senna  2 tablet Oral QHS   Continuous Infusions: . sodium chloride 100 mL/hr at 01/16/17 1406  . cefTRIAXone (ROCEPHIN)  IV Stopped (01/16/17 1108)   PRN Meds:.ALPRAZolam, bisacodyl, HYDROmorphone (DILAUDID) injection, meclizine, ondansetron (ZOFRAN) IV, oxyCODONE Medications Prior to Admission:  Prior to Admission medications   Medication Sig Start Date  End Date Taking? Authorizing Provider  acetaminophen (TYLENOL) 500 MG tablet Take 1,000 mg every 2 (two) hours as needed by mouth for mild pain or moderate pain.   Yes [provider]  ALPRAZolam (XANAX) 0.25 MG tablet Take 0.25 mg by mouth at bedtime as needed for anxiety.   Yes [provider]  aspirin 81 MG tablet Take 81 mg by mouth daily.   Yes [provider]  esomeprazole (NEXIUM) 20 MG capsule Take 40 mg daily at 12 noon by  mouth.   Yes [provider]  HYDROcodone-acetaminophen (NORCO/VICODIN) 5-325 MG tablet Take 1 tablet every 2 (two) hours as needed by mouth. 01/05/17  Yes [provider]  levothyroxine (LEVOTHROID) 25 MCG tablet Take 25 mcg by mouth daily.     Yes [provider]  losartan (COZAAR) 50 MG tablet Take 1 tablet (50 mg total) by mouth daily. 12/05/16  Yes Weaver, Scott T, PA-C  metoprolol succinate (TOPROL-XL) 100 MG 24 hr tablet Take 1 tablet (100 mg total) by mouth daily. Take with or immediately following a meal. Patient taking differently: Take 100 mg at bedtime by mouth. Take with or immediately following a meal.  12/20/16  Yes Weaver, Scott T, PA-C  traMADol (ULTRAM) 50 MG tablet Take 50 mg every 2 (two) hours as needed by mouth. 12/25/16  Yes [provider]  meclizine (ANTIVERT) 25 MG tablet Take one every 8 hours as needed for dizziness/vertigo Patient not taking: Reported on 01/12/2017 11/16/10   Larey Dresser, MD   Allergies  Allergen Reactions  . Crestor [Rosuvastatin Calcium] Other (See Comments)    Myalgias  . Ezetimibe     Other reaction(s): GI Upset (intolerance)  . Rosuvastatin Calcium     Other reaction(s): Other (See Comments) Myalgias  . Sulfa Antibiotics   . Sulfacetamide Sodium     Other reaction(s): Other (See Comments)   Review of Systems +constipation +pain in hip  Physical Exam NAD S1 S2 Clear Abdomen soft No edema Awake alert Pain in L hip area  Vital Signs: BP  105/69 (BP Location: Left Arm)   Pulse 70   Temp 98.3 F (36.8 C) (Oral)   Resp 16   SpO2 97%  Pain Assessment: 0-10 POSS *See Group Information*: S-Acceptable,Sleep, easy to arouse Pain Score: 5    SpO2: SpO2: 97 % O2 Device:SpO2: 97 % O2 Flow Rate: .O2 Flow Rate (L/min): 2 L/min  IO: Intake/output summary:   Intake/Output Summary (Last 24 hours) at 01/16/2017 1638 Last data filed at 01/16/2017 1326 Gross per 24 hour  Intake 150 ml  Output 2050 ml  Net -1900 ml    LBM: Last BM Date: 01/12/17 Baseline Weight:   Most recent weight:       Palliative Assessment/Data:   PPS 40%  Time In:  1500 Time Out:  1610 Time Total:  70 min  Greater than 50%  of this time was spent counseling and coordinating care related to the above assessment and plan.  Signed by: Loistine Chance, MD  [phone]  Please contact Palliative Medicine Team phone at [phone] for questions and concerns.  For individual provider: See Shea Evans

## 2017-01-16 NOTE — Progress Notes (Signed)
    Subjective:    Patient reports pain as 5 on 0-10 scale.   Denies CP or SOB.  Voiding without difficulty. Positive flatus. Pt reports her pain is not controlled. Objective: Vital signs in last 24 hours: Temp:  [97.8 F (36.6 C)-98.4 F (36.9 C)] 98.4 F (36.9 C) (11/13 0423) Pulse Rate:  [68-87] 70 (11/13 0423) Resp:  [13-19] 18 (11/13 0423) BP: (94-128)/(64-99) 114/69 (11/13 0423) SpO2:  [98 %-100 %] 100 % (11/13 0423)  Intake/Output from previous day: 11/12 0701 - 11/13 0700 In: -  Out: 1200 [Urine:1200] Intake/Output this shift: No intake/output data recorded.  Labs: Recent Labs    01/14/17 0410 01/15/17 0408 01/16/17 0507  HGB 13.5 11.5* 11.0*   Recent Labs    01/15/17 0408 01/16/17 0507  WBC 5.1 5.1  RBC 3.88 3.69*  HCT 33.5* 32.1*  PLT 173 159   Recent Labs    01/15/17 0408 01/16/17 0408  NA 132* 133*  K 3.8 4.7  CL 99* 102  CO2 23 22  BUN 17 10  CREATININE 0.92 0.77  GLUCOSE 88 74  CALCIUM 9.1 8.9   Recent Labs    01/15/17 0920  INR 1.04    Physical Exam: Neurologically intact ABD soft Sensation intact distally Compartment soft  Assessment/Plan:   Pt and daughter would like to speak to the Oncologist today Pt and daughter would like better control of pain Pt is trying to decide if she should move forward with treatment or go to [facility], [facility]la Lesches for Dr. Melina Schools [facility] [phone] 01/16/2017, 7:40 AM

## 2017-01-17 ENCOUNTER — Other Ambulatory Visit: Payer: Self-pay

## 2017-01-17 DIAGNOSIS — N179 Acute kidney failure, unspecified: Secondary | ICD-10-CM

## 2017-01-17 DIAGNOSIS — R7881 Bacteremia: Secondary | ICD-10-CM

## 2017-01-17 DIAGNOSIS — I10 Essential (primary) hypertension: Secondary | ICD-10-CM

## 2017-01-17 DIAGNOSIS — E871 Hypo-osmolality and hyponatremia: Secondary | ICD-10-CM

## 2017-01-17 LAB — CBC
HEMATOCRIT: 32.5 % — AB (ref 36.0–46.0)
HEMOGLOBIN: 11 g/dL — AB (ref 12.0–15.0)
MCH: 29.9 pg (ref 26.0–34.0)
MCHC: 33.8 g/dL (ref 30.0–36.0)
MCV: 88.3 fL (ref 78.0–100.0)
Platelets: 136 10*3/uL — ABNORMAL LOW (ref 150–400)
RBC: 3.68 MIL/uL — AB (ref 3.87–5.11)
RDW: 15.7 % — ABNORMAL HIGH (ref 11.5–15.5)
WBC: 4.9 10*3/uL (ref 4.0–10.5)

## 2017-01-17 LAB — BASIC METABOLIC PANEL
ANION GAP: 6 (ref 5–15)
BUN: 9 mg/dL (ref 6–20)
CHLORIDE: 105 mmol/L (ref 101–111)
CO2: 23 mmol/L (ref 22–32)
CREATININE: 0.77 mg/dL (ref 0.44–1.00)
Calcium: 9 mg/dL (ref 8.9–10.3)
GFR calc non Af Amer: >60 mL/min (ref >60)
Glucose, Bld: 76 mg/dL (ref 65–99)
POTASSIUM: 3.7 mmol/L (ref 3.5–5.1)
SODIUM: 134 mmol/L — AB (ref 135–145)

## 2017-01-17 MED ORDER — PAROXETINE HCL 20 MG PO TABS
20.0000 mg | ORAL_TABLET | Freq: Every day | ORAL | Status: DC
  Administered 2017-01-17 – 2017-01-20 (×4): 20 mg via ORAL
  Filled 2017-01-17 (×4): qty 1

## 2017-01-17 MED ORDER — FENTANYL 50 MCG/HR TD PT72
50.0000 ug | MEDICATED_PATCH | TRANSDERMAL | Status: DC
  Administered 2017-01-17: 50 ug via TRANSDERMAL
  Filled 2017-01-17: qty 1

## 2017-01-17 MED ORDER — HYDROMORPHONE HCL 1 MG/ML IJ SOLN
0.5000 mg | INTRAMUSCULAR | Status: DC | PRN
  Administered 2017-01-18 (×2): 0.5 mg via INTRAVENOUS
  Filled 2017-01-17 (×2): qty 1

## 2017-01-17 MED ORDER — LORAZEPAM 0.5 MG PO TABS
0.5000 mg | ORAL_TABLET | Freq: Two times a day (BID) | ORAL | Status: DC
  Administered 2017-01-17 – 2017-01-20 (×6): 0.5 mg via ORAL
  Filled 2017-01-17 (×6): qty 1

## 2017-01-17 NOTE — Progress Notes (Signed)
PROGRESS NOTE    Theresa Levine [facility]  XLK:[id number] DOB: 1950/09/20 DOA: 01/12/2017 PCP: Aletha Halim., PA-C     Brief Narrative:  66 y.o. WF PMHx LEFT Breast cancer (S/P XRT left side of the chest in 2008) - she noted to me she received chemo/radiation/lumpectomy with last therapy being in the 90's), SVT, HTN, HLD, has been seen in ortho specialist office Dr. Rolena Infante for evaluation of right hip pain that has been getting progressively worse over the past week. Patient describes pain as constant and sharp, worse with weight bearing and associated with occasional cramping, numbness in the right hip area. Patient reports trying vicodin but it has not helped with pain. Patient was found to have metastatic lesion in the left femur with impeding fracture. Plan was for oncology to see prior to deciding on surgery.      Subjective: 11/14 A/O 4, negative CP, negative SOB, negative N/V, negative abdominal pain. Positive LEFT hip pain, positive back pain. States pain uncontrolled.   Assessment & Plan:   Principal Problem:   Breast cancer metastasized to bone/Bone metastases/Bony metastasis/H/o Breast Cancer Active Problems:   Back pain/Leg pain   Pain   Bone lesion   Cancer (H[facility])   Left hip pain   Encounter for palliative care   Goals of care, counseling/discussion  Multiple Bone Lesions/(large lesion LEFT Acetabulum/distal LEFT femur) -Per EMR impending fracture Dr. Regan Rakers Orthopedic surgery recommends prophylactic IM  -Continue TLSO brace per orthopedic surgery -Currently patient NOT inclined to proceed with surgery, however will render final decision after pathology report from biopsy available -Continue nonweightbearing for now -MRI brain negative for metastases -L4 biopsy results pending  Uncontrolled Pain -11/14 increase Fentanyl patch 50 g q 72 hr -OxyIR 5 mg q 4hr PRN  Anxiety -11/14 DC Xanax -11/14 start Ativan 0.5 mg BID -11/14 start Paxil 20 mg  daily - Hyponatremia:  Improving with IVF -resolving with IVF     UTI  positive Klebsiella pneumoniae -Completed a five-day course antibiotics     Acute renal failure -Resolved  Essential HTN,   -Toprol 100 mg daily  Hypothyroidism - continue synthroid, increase to 37.5 mcg, f/u in about 6 weeks repeat TSH   Smoker - provide nicotine patch     DVT prophylaxis: Lovenox Code Status: Full Family Communication: Daughter-in-law bedside Torie Towle [facility]) [phone] Disposition Plan: TBD   Consultants:  Oncology Orthopedic surgery    Procedures/Significant Events:  11/12 CT guided biopsy L4: Destructive soft tissue lesion involving the left side of the L4 vertebral body including the left transverse process. Core biopsies were obtained of the L4 left transverse process.   IMPRESSION: CT-guided core biopsies of the L4 lesion.  11/12 MRI brain:Brain: No evidence for acute infarction, hemorrhage, mass lesion, hydrocephalus, or extra-axial fluid. Mild atrophy is noted. Mild subcortical and periventricular T2 and FLAIR hyperintensities, likely chronic microvascular ischemic change.   Post infusion images are motion degraded, but overall, diagnostic. Post infusion, no abnormal enhancement of the brain or meninges.   Vascular: Flow voids are maintained throughout the carotid, basilar, and vertebral arteries. There are no areas of chronic hemorrhage.   Skull and upper cervical spine: Unremarkable visualized calvarium, skullbase, and cervical vertebrae. Pituitary, pineal, cerebellar tonsils unremarkable. No upper cervical cord lesions.   Sinuses/Orbits: Hypoplastic RIGHT maxillary sinus. No layering fluid. Negative orbits.     I have personally reviewed and interpreted all radiology studies and my findings are as above.  VENTILATOR SETTINGS:    Cultures 11/12  urine positive Klebsiella pneumoniae    Antimicrobials: Anti-infectives (From admission,  onward)   Start     Stop   01/15/17 1000  cefTRIAXone (ROCEPHIN) 1 g in dextrose 5 % 50 mL IVPB     01/20/17 0959       Devices TLSO brace   LINES / TUBES:      Continuous Infusions: . sodium chloride 100 mL/hr at 01/17/17 0804  . cefTRIAXone (ROCEPHIN)  IV Stopped (01/17/17 1035)     Objective: Vitals:   01/16/17 0930 01/16/17 1345 01/16/17 2015 01/17/17 1515  BP: 105/65 105/69 117/78   Pulse: 60 70 65 71  Resp: 16 16 16 16   Temp: (!) 97.1 F (36.2 C) 98.3 F (36.8 C) 98.8 F (37.1 C) 98.1 F (36.7 C)  TempSrc: Oral Oral Oral Oral  SpO2: 100% 97% 98% 100%    Intake/Output Summary (Last 24 hours) at 01/17/2017 1638 Last data filed at 01/17/2017 1515 Gross per 24 hour  Intake 7828.33 ml  Output -  Net 7828.33 ml   There were no vitals filed for this visit.  Examination:  General: A/O 4, No acute respiratory distress Neck:  Negative scars, masses, torticollis, lymphadenopathy, JVD Lungs: Clear to auscultation bilaterally without wheezes or crackles Cardiovascular: Regular rate and rhythm without murmur gallop or rub normal S1 and S2 Abdomen: negative abdominal pain, nondistended, positive soft, bowel sounds, no rebound, no ascites, no appreciable mass Extremities: pain to palpation left buttocks and trochanter Skin: Negative rashes, lesions, ulcers Psychiatric:  Positive depression, positive anxiety, negative fatigue, negative mania  Central nervous system:  Cranial nerves II through XII intact, tongue/uvula midline, all extremities muscle strength 5/5, sensation intact throughout,  negative dysarthria, negative expressive aphasia, negative receptive aphasia.  .     Data Reviewed: Care during the described time interval was provided by me .  I have reviewed this patient's available data, including medical history, events of note, physical examination, and all test results as part of my evaluation.  CBC: Recent Labs  Lab 01/13/17 0436 01/14/17 0410  01/15/17 0408 01/16/17 0507 01/17/17 0409  WBC 6.5 5.9 5.1 5.1 4.9  HGB 13.4 13.5 11.5* 11.0* 11.0*  HCT 38.2 38.2 33.5* 32.1* 32.5*  MCV 85.5 86.2 86.3 87.0 88.3  PLT 199 200 173 159 622*   Basic Metabolic Panel: Recent Labs  Lab 01/13/17 0436 01/14/17 0410 01/15/17 0408 01/16/17 0408 01/17/17 0409  NA 129* 129* 132* 133* 134*  K 3.6 3.9 3.8 4.7 3.7  CL 93* 92* 99* 102 105  CO2 25 24 23 22 23   GLUCOSE 107* 102* 88 74 76  BUN 15 22* 17 10 9   CREATININE 1.09* 1.37* 0.92 0.77 0.77  CALCIUM 10.0 10.0 9.1 8.9 9.0   GFR: CrCl cannot be calculated (Unknown ideal weight.). Liver Function Tests: No results for input(s): AST, ALT, ALKPHOS, BILITOT, PROT, ALBUMIN in the last 168 hours. No results for input(s): LIPASE, AMYLASE in the last 168 hours. No results for input(s): AMMONIA in the last 168 hours. Coagulation Profile: Recent Labs  Lab 01/15/17 0920  INR 1.04   Cardiac Enzymes: No results for input(s): CKTOTAL, CKMB, CKMBINDEX, TROPONINI in the last 168 hours. BNP (last 3 results) No results for input(s): PROBNP in the last 8760 hours. HbA1C: No results for input(s): HGBA1C in the last 72 hours. CBG: No results for input(s): GLUCAP in the last 168 hours. Lipid Profile: No results for input(s): CHOL, HDL, LDLCALC, TRIG, CHOLHDL, LDLDIRECT in the last 72 hours. Thyroid  Function Tests: No results for input(s): TSH, T4TOTAL, FREET4, T3FREE, THYROIDAB in the last 72 hours. Anemia Panel: No results for input(s): VITAMINB12, FOLATE, FERRITIN, TIBC, IRON, RETICCTPCT in the last 72 hours. Sepsis Labs: No results for input(s): PROCALCITON, LATICACIDVEN in the last 168 hours.  Recent Results (from the past 240 hour(s))  Culture, Urine     Status: Abnormal (Preliminary result)   Collection Time: 01/15/17  9:30 AM  Result Value Ref Range Status   Specimen Description URINE, CLEAN CATCH  Final   Special Requests NONE  Final   Culture (A)  Final    >=100,000 COLONIES/mL  KLEBSIELLA PNEUMONIAE SUSCEPTIBILITIES TO FOLLOW Performed at [facility], [address], [place], [place] [address]    Report Status PENDING  Incomplete         Radiology Studies: No results found.      Scheduled Meds: . acetaminophen  1,000 mg Oral Q8H  . aspirin EC  81 mg Oral Daily  . enoxaparin (LOVENOX) injection  40 mg Subcutaneous Q24H  . feeding supplement (ENSURE ENLIVE)  237 mL Oral BID BM  . fentaNYL  25 mcg Transdermal Q72H  . levothyroxine  37.5 mcg Oral QAC breakfast  . metoprolol succinate  100 mg Oral q1800  . nicotine  21 mg Transdermal Daily  . pantoprazole sodium  40 mg Oral Daily  . senna  2 tablet Oral QHS   Continuous Infusions: . sodium chloride 100 mL/hr at 01/17/17 0804  . cefTRIAXone (ROCEPHIN)  IV Stopped (01/17/17 1035)     LOS: 5 days    Time spent:40 min    WOODS, Geraldo Docker, MD [facility] Pager [phone]  If 7PM-7AM, please contact night-coverage [email or web address] Password [facility] 01/17/2017, 4:38 PM

## 2017-01-17 NOTE — Progress Notes (Signed)
   01/17/17 1721  Clinical Encounter Type  Visited With Patient and family together;Health care provider  Visit Type Follow-up  Spiritual Encounters  Spiritual Needs Emotional;Prayer  Stress Factors  Patient Stress Factors Major life changes   Returned to finish initial visit.  Patient seems to be struggling with some decisions and which direction to go. She really wants all the information before she decides.  She wants some quality of life, is worried about pain.  Physician had a long talk with her and she feels better about pain control.  We prayed together.  Will follow as needed. Chaplain Katherene Ponto

## 2017-01-17 NOTE — Progress Notes (Signed)
   01/17/17 1447  Clinical Encounter Type  Visited With Patient and family together  Visit Type Initial  Spiritual Encounters  Spiritual Needs Emotional  Stress Factors  Patient Stress Factors Health changes   Visiting with patients on the Palliative list.  This was my first visit with the patient.  Her sister-in-law was present and seems to be a good care giver and advocate.  Patient discussed why she is in the hospital and is waiting for medical team to present the options for care.  She seems a little stressed trying to process what all this means for her now and in the future.  She does not want to give up, but she also is thinking about quality of life.  Physical Therapy came in so I will check back before the end of the day. Chaplain Katherene Ponto

## 2017-01-17 NOTE — Evaluation (Signed)
Physical Therapy Evaluation Patient Details Name: Theresa Levine MRN: [id number] DOB: Mar 09, 1950 Today's Date: 01/17/2017   History of Present Illness  Pt admitted with c/o L hip pain.  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  Clinical Impression  Pt admitted as above and presenting with functional mobility limitations 2* generalized weakness, ambulatory balance deficits, back and L hip pain and PWB on L LE.   Dependent on acute stay progress, pt would benefit from follow up rehab at SNF level to maximize IND and safety.       Follow Up Recommendations SNF    Equipment Recommendations  None recommended by PT    Recommendations for Other Services OT consult     Precautions / Restrictions Precautions Precautions: Fall Required Braces or Orthoses: Spinal Brace Spinal Brace: Thoracolumbosacral orthotic Restrictions Weight Bearing Restrictions: Yes LLE Weight Bearing: Partial weight bearing LLE Partial Weight Bearing Percentage or Pounds: 50%      Mobility  Bed Mobility               General bed mobility comments: OOB in chair on arrival and requests back to same  Transfers Overall transfer level: Needs assistance Equipment used: None Transfers: Sit to/from Stand Sit to Stand: Min assist         General transfer comment: cues for transition position and use of UEs to self assist.  Ambulation/Gait Ambulation/Gait assistance: Min assist;+2 safety/equipment(chair follow) Ambulation Distance (Feet): 38 Feet Assistive device: Rolling walker (2 wheeled) Gait Pattern/deviations: Step-to pattern;Decreased step length - right;Decreased step length - left;Shuffle;Trunk flexed Gait velocity: decr Gait velocity interpretation: Below normal speed for age/gender General Gait Details: cues for sequence, posture, position from RW and PWB  Stairs            Wheelchair Mobility     Modified Rankin (Stroke Patients Only)       Balance Overall balance assessment: Needs assistance Sitting-balance support: Feet supported;No upper extremity supported Sitting balance-Leahy Scale: Good     Standing balance support: Bilateral upper extremity supported Standing balance-Leahy Scale: Poor                               Pertinent Vitals/Pain Pain Assessment: Faces Faces Pain Scale: Hurts little more Pain Location: R buttock and lumbar spine Pain Descriptors / Indicators: Sore;Aching Pain Intervention(s): Limited activity within patient's tolerance;Monitored during session;Premedicated before session    Home Living Family/patient expects to be discharged to:: Unsure Living Arrangements: Alone               Additional Comments: Pt and sister-in-law present state they have many decisions to make    Prior Function Level of Independence: Independent               Hand Dominance        Extremity/Trunk Assessment   Upper Extremity Assessment Upper Extremity Assessment: Generalized weakness    Lower Extremity Assessment Lower Extremity Assessment: Generalized weakness       Communication   Communication: No difficulties  Cognition Arousal/Alertness: Awake/alert Behavior During Therapy: Flat affect Overall Cognitive Status: Within Functional Limits for tasks assessed                                 General Comments: Pt very literal in interpretation  of all interactions.      General Comments      Exercises     Assessment/Plan    PT Assessment Patient needs continued PT services  PT Problem List Decreased strength;Decreased activity tolerance;Decreased balance;Decreased mobility;Decreased knowledge of use of DME;Pain       PT Treatment Interventions DME instruction;Gait training;Functional mobility training;Therapeutic activities;Therapeutic exercise;Patient/family education    PT Goals (Current goals can  be found in the Care Plan section)  Acute Rehab PT Goals Patient Stated Goal: Get more information so I can make the right decisions regarding future intertventions PT Goal Formulation: With patient Time For Goal Achievement: 01/31/17 Potential to Achieve Goals: Fair    Frequency Min 3X/week   Barriers to discharge        Co-evaluation               AM-PAC PT "6 Clicks" Daily Activity  Outcome Measure Difficulty turning over in bed (including adjusting bedclothes, sheets and blankets)?: Unable Difficulty moving from lying on back to sitting on the side of the bed? : Unable Difficulty sitting down on and standing up from a chair with arms (e.g., wheelchair, bedside commode, etc,.)?: Unable Help needed moving to and from a bed to chair (including a wheelchair)?: A Lot Help needed walking in hospital room?: A Little Help needed climbing 3-5 steps with a railing? : A Lot 6 Click Score: 10    End of Session Equipment Utilized During Treatment: Gait belt Activity Tolerance: Patient limited by fatigue;Patient limited by pain Patient left: in chair;with call bell/phone within reach;with family/visitor present Nurse Communication: Mobility status PT Visit Diagnosis: Unsteadiness on feet (R26.81);Difficulty in walking, not elsewhere classified (R26.2);Pain Pain - Right/Left: Left Pain - part of body: Hip    Time: 4010-2725 PT Time Calculation (min) (ACUTE ONLY): 28 min   Charges:   PT Evaluation $PT Eval Low Complexity: 1 Low PT Treatments $Gait Training: 8-22 mins   PT G Codes:        Pg [phone]   Kimothy Kishimoto 01/17/2017, 5:39 PM

## 2017-01-18 ENCOUNTER — Other Ambulatory Visit: Payer: Self-pay | Admitting: Oncology

## 2017-01-18 DIAGNOSIS — Z171 Estrogen receptor negative status [ER-]: Secondary | ICD-10-CM

## 2017-01-18 DIAGNOSIS — F419 Anxiety disorder, unspecified: Secondary | ICD-10-CM

## 2017-01-18 DIAGNOSIS — B961 Klebsiella pneumoniae [K. pneumoniae] as the cause of diseases classified elsewhere: Secondary | ICD-10-CM

## 2017-01-18 LAB — CBC
HCT: 31.8 % — ABNORMAL LOW (ref 36.0–46.0)
HEMOGLOBIN: 10.7 g/dL — AB (ref 12.0–15.0)
MCH: 29.6 pg (ref 26.0–34.0)
MCHC: 33.6 g/dL (ref 30.0–36.0)
MCV: 87.8 fL (ref 78.0–100.0)
PLATELETS: 146 10*3/uL — AB (ref 150–400)
RBC: 3.62 MIL/uL — AB (ref 3.87–5.11)
RDW: 15.4 % (ref 11.5–15.5)
WBC: 5.2 10*3/uL (ref 4.0–10.5)

## 2017-01-18 LAB — MAGNESIUM: MAGNESIUM: 1.1 mg/dL — AB (ref 1.7–2.4)

## 2017-01-18 LAB — BASIC METABOLIC PANEL
ANION GAP: 8 (ref 5–15)
BUN: 9 mg/dL (ref 6–20)
CALCIUM: 8.6 mg/dL — AB (ref 8.9–10.3)
CO2: 21 mmol/L — ABNORMAL LOW (ref 22–32)
Chloride: 103 mmol/L (ref 101–111)
Creatinine, Ser: 0.74 mg/dL (ref 0.44–1.00)
Glucose, Bld: 89 mg/dL (ref 65–99)
POTASSIUM: 3.5 mmol/L (ref 3.5–5.1)
Sodium: 132 mmol/L — ABNORMAL LOW (ref 135–145)

## 2017-01-18 LAB — URINE CULTURE

## 2017-01-18 NOTE — Progress Notes (Signed)
PROGRESS NOTE    Theresa [facility]  YWV:[id number] DOB: 11/28/1950 DOA: 01/12/2017 PCP: Aletha Halim., PA-C     Brief Narrative:  66 y.o. WF PMHx LEFT Breast cancer (S/P XRT left side of the chest in 2008) - she noted to me she received chemo/radiation/lumpectomy with last therapy being in the 90's), SVT, HTN, HLD, has been seen in ortho specialist office Dr. Rolena Infante for evaluation of right hip pain that has been getting progressively worse over the past week. Patient describes pain as constant and sharp, worse with weight bearing and associated with occasional cramping, numbness in the right hip area. Patient reports trying vicodin but it has not helped with pain. Patient was found to have metastatic lesion in the left femur with impeding fracture. Plan was for oncology to see prior to deciding on surgery.      Subjective: 11/15 A/O 4, negative CP, negative SOB, negative N/V, negative abdominal pain. States back/LEFT hip pain much better controlled.     Assessment & Plan:   Principal Problem:   Breast cancer metastasized to bone/Bone metastases/Bony metastasis/H/o Breast Cancer Active Problems:   Back pain/Leg pain   Pain   Bone lesion   Cancer (HCC)   Left hip pain   Encounter for palliative care   Goals of care, counseling/discussion  Multiple Bone Lesions/(large lesion LEFT Acetabulum/distal LEFT femur) -Per EMR impending fracture Dr. Regan Rakers Orthopedic surgery recommends prophylactic IM  -Continue TLSO brace per orthopedic surgery -Currently patient NOT inclined to proceed with surgery, however will render final decision after pathology report from biopsy available -Continue nonweightbearing for now -MRI brain negative for metastases -L4 biopsy results: Positive Metastatic adenocarcinoma breast cancer possibility given her history see results below.  -patient provided all information and is trying to decide on treatment i.e. chemotherapy + XRT  vs no  treatment. Oncology on board and has spoken with patient we will await recommendations.  -Reconsult orthopedic surgery in [place] now that biopsy results have returned would like to speak with surgeon concerning possible left hip procedure.  Uncontrolled Pain -Fentanyl patch 50 g q 72 hr -OxyIR 5 mg q 4hr PRN -Do not restart Dilaudid IV. Increase PO medication if required.   Anxiety - Ativan 0.5 mg BID -Paxil 20 mg daily  Hyponatremia:  Improving with IVF -normal saline 100 ml/hr   UTI  positive Klebsiella pneumoniae -Complete 5 day course antibiotics     Acute renal failure -Resolved  Essential HTN,   -Toprol 100 mg daily  Hypothyroidism - continue synthroid, increase to 37.5 mcg, f/u in about 6 weeks repeat TSH   Smoker - provide nicotine patch     DVT prophylaxis: Lovenox Code Status: Full Family Communication: Daughter-in-law bedside [facility] [phone] Disposition Plan: TBD: Palliative care discussing the options.   Consultants:  Oncology Orthopedic surgery    Procedures/Significant Events:  11/6 PET scan skull base-thigh:Tiny hypermetabolic lymph nodes in the right supraclavicular region, right subpectoral space, inferior right hilum, and abdominal para-aortic retroperitoneal space. 2. Large hypermetabolic bone lesions in the sternum, L3 vertebral body, left acetabulum, and distal left femur with other smaller scattered hypermetabolic bone metastases evident. 3. Hypermetabolism in the adductor musculature of the proximal left thigh, possibly movement related but metastatic disease is a concern. ------------------------------------------------------------------------------------------------------------------------------------- 11/12 CT guided biopsy L4: -Destructive soft tissue lesion involving the left side of the L4 vertebral body including the left transverse process. -Core biopsies were obtained of the L4 left transverse process. 11/12 MRI  brain:Brain: -No evidence  for acute infarction, hemorrhage, mass lesion, hydrocephalus, or extra-axial fluid. Mild atrophy is noted. -Mild subcortical and periventricular T2 and FLAIR hyperintensities, likely chronic microvascular ischemic change.       I have personally reviewed and interpreted all radiology studies and my findings are as above.  VENTILATOR SETTINGS:    Cultures 11/12 urine positive Klebsiella pneumoniae 11/12 biopsy L4 transverse process:METASTATIC CARCINOMA -neoplastic cells are positive for cytokeratin 7, cytokeratin 20 (patchy) and ER (weak, focal positivity) but negative for PR, GATA-3, GCDFP, and CDX-2. The immunoprofile is largely non-specific.  -However, given the weak ER staining (other markers are negative) and patient's history of breast carcinoma, primary breast metastasis remains in the differential.    Antimicrobials: Anti-infectives (From admission, onward)   Start     Stop   01/15/17 1000  cefTRIAXone (ROCEPHIN) 1 g in dextrose 5 % 50 mL IVPB     01/20/17 0959       Devices TLSO brace   LINES / TUBES:      Continuous Infusions: . sodium chloride 100 mL/hr at 01/18/17 0222  . cefTRIAXone (ROCEPHIN)  IV Stopped (01/17/17 1035)     Objective: Vitals:   01/16/17 2015 01/17/17 1515 01/17/17 2126 01/18/17 0543  BP:  127/75 129/78 128/77  Pulse: 65 71 78 76  Resp: 16 16 14 12   Temp: 98.8 F (37.1 C) 98.1 F (36.7 C) 98.4 F (36.9 C) 98 F (36.7 C)  TempSrc: Oral Oral Oral Oral  SpO2: 98% 100% 97% 99%    Intake/Output Summary (Last 24 hours) at 01/18/2017 0849 Last data filed at 01/17/2017 2126 Gross per 24 hour  Intake 8068.33 ml  Output -  Net 8068.33 ml   There were no vitals filed for this visit.  Examination:  General: A/O 4, No acute respiratory distress Neck:  Negative scars, masses, torticollis, lymphadenopathy, JVD Lungs: Clear to auscultation bilaterally without wheezes or crackles Cardiovascular: Regular  rate and rhythm without murmur gallop or rub normal S1 and S2 Abdomen: negative abdominal pain, nondistended, positive soft, bowel sounds, no rebound, no ascites, no appreciable mass Extremities: pain to palpation left buttocks and trochanter Skin: Negative rashes, lesions, ulcers Psychiatric:  Positive depression, positive anxiety, negative fatigue, negative mania  Central nervous system:  Cranial nerves II through XII intact, tongue/uvula midline, all extremities muscle strength 5/5, sensation intact throughout,  negative dysarthria, negative expressive aphasia, negative receptive aphasia  .     Data Reviewed: Care during the described time interval was provided by me .  I have reviewed this patient's available data, including medical history, events of note, physical examination, and all test results as part of my evaluation.  CBC: Recent Labs  Lab 01/14/17 0410 01/15/17 0408 01/16/17 0507 01/17/17 0409 01/18/17 0401  WBC 5.9 5.1 5.1 4.9 5.2  HGB 13.5 11.5* 11.0* 11.0* 10.7*  HCT 38.2 33.5* 32.1* 32.5* 31.8*  MCV 86.2 86.3 87.0 88.3 87.8  PLT 200 173 159 136* 412*   Basic Metabolic Panel: Recent Labs  Lab 01/14/17 0410 01/15/17 0408 01/16/17 0408 01/17/17 0409 01/18/17 0401  NA 129* 132* 133* 134* 132*  K 3.9 3.8 4.7 3.7 3.5  CL 92* 99* 102 105 103  CO2 24 23 22 23  21*  GLUCOSE 102* 88 74 76 89  BUN 22* 17 10 9 9   CREATININE 1.37* 0.92 0.77 0.77 0.74  CALCIUM 10.0 9.1 8.9 9.0 8.6*  MG  --   --   --   --  1.1*   GFR: CrCl  cannot be calculated (Unknown ideal weight.). Liver Function Tests: No results for input(s): AST, ALT, ALKPHOS, BILITOT, PROT, ALBUMIN in the last 168 hours. No results for input(s): LIPASE, AMYLASE in the last 168 hours. No results for input(s): AMMONIA in the last 168 hours. Coagulation Profile: Recent Labs  Lab 01/15/17 0920  INR 1.04   Cardiac Enzymes: No results for input(s): CKTOTAL, CKMB, CKMBINDEX, TROPONINI in the last 168  hours. BNP (last 3 results) No results for input(s): PROBNP in the last 8760 hours. HbA1C: No results for input(s): HGBA1C in the last 72 hours. CBG: No results for input(s): GLUCAP in the last 168 hours. Lipid Profile: No results for input(s): CHOL, HDL, LDLCALC, TRIG, CHOLHDL, LDLDIRECT in the last 72 hours. Thyroid Function Tests: No results for input(s): TSH, T4TOTAL, FREET4, T3FREE, THYROIDAB in the last 72 hours. Anemia Panel: No results for input(s): VITAMINB12, FOLATE, FERRITIN, TIBC, IRON, RETICCTPCT in the last 72 hours. Sepsis Labs: No results for input(s): PROCALCITON, LATICACIDVEN in the last 168 hours.  Recent Results (from the past 240 hour(s))  Culture, Urine     Status: Abnormal (Preliminary result)   Collection Time: 01/15/17  9:30 AM  Result Value Ref Range Status   Specimen Description URINE, CLEAN CATCH  Final   Special Requests NONE  Final   Culture (A)  Final    >=100,000 COLONIES/mL KLEBSIELLA PNEUMONIAE SUSCEPTIBILITIES TO FOLLOW Performed at [facility], [address], [place], [place] [address]    Report Status PENDING  Incomplete         Radiology Studies: No results found.      Scheduled Meds: . acetaminophen  1,000 mg Oral Q8H  . aspirin EC  81 mg Oral Daily  . enoxaparin (LOVENOX) injection  40 mg Subcutaneous Q24H  . feeding supplement (ENSURE ENLIVE)  237 mL Oral BID BM  . fentaNYL  50 mcg Transdermal Q72H  . levothyroxine  37.5 mcg Oral QAC breakfast  . LORazepam  0.5 mg Oral BID  . metoprolol succinate  100 mg Oral q1800  . nicotine  21 mg Transdermal Daily  . pantoprazole sodium  40 mg Oral Daily  . PARoxetine  20 mg Oral Daily  . senna  2 tablet Oral QHS   Continuous Infusions: . sodium chloride 100 mL/hr at 01/18/17 0222  . cefTRIAXone (ROCEPHIN)  IV Stopped (01/17/17 1035)     LOS: 6 days    Time spent:40 min    WOODS, Geraldo Docker, MD [facility] Pager [phone]  If 7PM-7AM, please contact  night-coverage [email or web address] Password [facility] 01/18/2017, 8:49 AM

## 2017-01-18 NOTE — Progress Notes (Signed)
CM following for disposition needs. PT recommendations are for SNF however patient may want to go home with her sister. Awaiting biopsy results for further planning. Marney Doctor RN,BSN,NCM [phone]

## 2017-01-18 NOTE — Evaluation (Signed)
Occupational Therapy Evaluation Patient Details Name: Theresa Levine MRN: [id number] DOB: 08-20-1950 Today's Date: 01/18/2017    History of Present Illness Pt admitted with c/o L hip pain.  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   Clinical Impression   This 66 year old female was admitted for the above.  A limited evaluation was performed as pt did not want to wear TLSO (ordered at all times). Also after working with AE, pt's pain increased.  She needs mod A for LB adls.  Goals in acute are for min A    Follow Up Recommendations  Supervision/Assistance - 24 hour;Home health OT(vs snf)    Equipment Recommendations  3 in 1 bedside commode    Recommendations for Other Services       Precautions / Restrictions Precautions Precautions: Fall;Back Precaution Comments: TLSO Required Braces or Orthoses: Spinal Brace at all times Spinal Brace: Thoracolumbosacral orthotic;Applied in sitting position Restrictions Weight Bearing Restrictions: Yes LLE Weight Bearing: Partial weight bearing LLE Partial Weight Bearing Percentage or Pounds: 50%      Mobility Bed Mobility               General bed mobility comments: OOB  Transfers                 General transfer comment: did not stand:  min A with PT. Pt not agreeable to wearing TLSO during OT eval    Balance                                           ADL either performed or assessed with clinical judgement   ADL Overall ADL's : Needs assistance/impaired Eating/Feeding: Independent   Grooming: Set up;Sitting   Upper Body Bathing: Set up;Sitting   Lower Body Bathing: Moderate assistance;Sit to/from stand;With adaptive equipment   Upper Body Dressing : Minimal assistance;Sitting   Lower Body Dressing: Moderate assistance;Sit to/from stand;With adaptive equipment                 General ADL Comments:  pt wanted to wait until later for ADL. Educated on Public affairs consultant for adls to decrease stress on both back and hip.  Pt did not have TLSO on in chair--she doesn't want it on, even though order is for all times except shower.       Vision         Perception     Praxis      Pertinent Vitals/Pain Pain Assessment: 0-10 Pain Score: 5  Faces Pain Scale: Hurts little more Pain Location: L hip Pain Descriptors / Indicators: Aching;Sore Pain Intervention(s): Limited activity within patient's tolerance;Monitored during session;Repositioned     Hand Dominance     Extremity/Trunk Assessment Upper Extremity Assessment Upper Extremity Assessment: Generalized weakness           Communication Communication Communication: No difficulties   Cognition Arousal/Alertness: Awake/alert Behavior During Therapy: Flat affect Overall Cognitive Status: Impaired/Different from baseline Area of Impairment: Awareness                           Awareness: Intellectual       General Comments       Exercises     Shoulder Instructions  Home Living Family/patient expects to be discharged to:: Unsure Living Arrangements: Alone                               Additional Comments: may stay with sister in law       Prior Functioning/Environment Level of Independence: Independent                 OT Problem List: Decreased strength;Decreased activity tolerance;Decreased knowledge of precautions;Decreased knowledge of use of DME or AE;Pain      OT Treatment/Interventions: Self-care/ADL training;DME and/or AE instruction;Patient/family education;Therapeutic activities    OT Goals(Current goals can be found in the care plan section) Acute Rehab OT Goals Patient Stated Goal: not be a burden on anyone OT Goal Formulation: With patient Time For Goal Achievement: 01/25/17 Potential to Achieve Goals: Good ADL Goals Pt Will Transfer to Toilet: (P) with min  guard assist;ambulating;bedside commode Pt Will Perform Toileting - Clothing Manipulation and hygiene: (P) with min guard assist;sit to/from stand Additional ADL Goal #1: (P) pt will understand need to wear TLSO and be compliant Additional ADL Goal #2: (P) pt will either ask for assistance for adls or use AE with min cues  OT Frequency: Min 2X/week   Barriers to D/C:            Co-evaluation              AM-PAC PT "6 Clicks" Daily Activity     Outcome Measure Help from another person eating meals?: None Help from another person taking care of personal grooming?: A Little Help from another person toileting, which includes using toliet, bedpan, or urinal?: A Lot Help from another person bathing (including washing, rinsing, drying)?: A Lot Help from another person to put on and taking off regular upper body clothing?: A Little Help from another person to put on and taking off regular lower body clothing?: A Lot 6 Click Score: 16   End of Session    Activity Tolerance: Patient tolerated treatment well Patient left: in chair;with call bell/phone within reach;with chair alarm set;with family/visitor present  OT Visit Diagnosis: Muscle weakness (generalized) (M62.81);Pain Pain - Right/Left: Left Pain - part of body: Hip                Time: 5364-6803 OT Time Calculation (min): 21 min Charges:  OT General Charges $OT Visit: 1 Visit OT Evaluation $OT Eval Low Complexity: 1 Low G-Codes:     [place], OTR/L [phone] 01/18/2017  Theresa Levine 01/18/2017, 2:49 PM

## 2017-01-18 NOTE — Progress Notes (Signed)
    Subjective:    Patient reports pain as 0 on 0-10 scale.   Denies CP or SOB.  Voiding without difficulty. Positive flatus.  Pt is still waiting on biopsy results from the Oncologist.   She says she wants to go home.  Objective: Vital signs in last 24 hours: Temp:  [97.8 F (36.6 C)-98.4 F (36.9 C)] 97.8 F (36.6 C) (11/15 1312) Pulse Rate:  [71-78] 71 (11/15 1312) Resp:  [12-16] 16 (11/15 1312) BP: (100-129)/(56-78) 100/56 (11/15 1312) SpO2:  [97 %-100 %] 97 % (11/15 1312)  Intake/Output from previous day: 11/14 0701 - 11/15 0700 In: 8068.3 [P.O.:720; I.V.:7198.3; IV Piggyback:150] Out: -  Intake/Output this shift: Total I/O In: 480 [P.O.:480] Out: -   Labs: Recent Labs    01/16/17 0507 01/17/17 0409 01/18/17 0401  HGB 11.0* 11.0* 10.7*   Recent Labs    01/17/17 0409 01/18/17 0401  WBC 4.9 5.2  RBC 3.68* 3.62*  HCT 32.5* 31.8*  PLT 136* 146*   Recent Labs    01/17/17 0409 01/18/17 0401  NA 134* 132*  K 3.7 3.5  CL 105 103  CO2 23 21*  BUN 9 9  CREATININE 0.77 0.74  GLUCOSE 76 89  CALCIUM 9.0 8.6*   No results for input(s): LABPT, INR in the last 72 hours.  Physical Exam: Neurologically intact ABD soft Sensation intact distally Dorsiflexion/Plantar flexion intact Compartment soft  Assessment/Plan:    Dr. Stann Mainland will continue to monitor leg metastasis and possible rod placement Pt will f/u with Dr/ Rolena Infante in clinic 2 weeks after discharge for consult on Lumbar  Oncology and Palliative care will continue to manage pain  Mayo, Darla Lesches for Dr. Melina Schools [facility] [phone] 01/18/2017, 2:18 PM

## 2017-01-18 NOTE — Progress Notes (Signed)
The results of the biopsy were reviewed and discussed with the patient today.  She appears to have metastatic carcinoma suggestive of breast primary that is ER negative.  The natural course of this disease was discussed today with the patient and her family.  I reiterated the fact that there is no cure for this malignancy and treatment is palliative.  Palliative chemotherapy will offer improving her overall survival by a few months.  I suggested two approaches for her: Aggressive approach with hip surgery followed by radiation and palliative chemotherapy versus a more conservative approach with supportive care only potentially radiation and hospice enrollment.  She will consider these options with moving forward after discussion with her family  I will set up a follow-up for her as an outpatient upon her discharge.  Please call with any questions regarding her care.

## 2017-01-18 NOTE — Progress Notes (Signed)
Physical Therapy Treatment Patient Details Name: Theresa Levine MRN: [id number] DOB: 1950/11/14 Today's Date: 01/18/2017    History of Present Illness Pt admitted with c/o L hip pain.  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    PT Comments    Educated pt and family members on importance of wearing TLSO when OOB. 25% VC's to maintain PWB status (50%), ambulated 25 feet with +2 assist for safety and equipment. Positioned in recliner, wearing TLSO, with family members present.    Follow Up Recommendations  SNF     Equipment Recommendations  None recommended by PT    Recommendations for Other Services       Precautions / Restrictions Precautions Precautions: Fall;Back Precaution Comments: TLSO Required Braces or Orthoses: Spinal Brace Spinal Brace: Thoracolumbosacral orthotic(at all times except showering) Restrictions Weight Bearing Restrictions: Yes LLE Weight Bearing: Partial weight bearing LLE Partial Weight Bearing Percentage or Pounds: 50%    Mobility  Bed Mobility               General bed mobility comments: OOB  Transfers Overall transfer level: Needs assistance Equipment used: Rolling walker (2 wheeled) Transfers: Sit to/from Stand Sit to Stand: Min assist         General transfer comment: 25% VC's with hand placement  Ambulation/Gait Ambulation/Gait assistance: Min assist;+2 safety/equipment Ambulation Distance (Feet): 25 Feet Assistive device: Rolling walker (2 wheeled) Gait Pattern/deviations: Step-to pattern;Decreased step length - right;Decreased step length - left;Shuffle;Trunk flexed Gait velocity: decreased Gait velocity interpretation: Below normal speed for age/gender General Gait Details: 25% VC's for [facility]    Stairs            Wheelchair Mobility    Modified Rankin (Stroke Patients Only)       Balance                                             Cognition Arousal/Alertness: Awake/alert Behavior During Therapy: WFL for tasks assessed/performed Overall Cognitive Status: Impaired/Different from baseline Area of Impairment: Orientation;Memory                 Orientation Level: Time   Memory: Decreased recall of precautions     Awareness: Intellectual   General Comments: Decreased ability to recall days/time      Exercises      General Comments        Pertinent Vitals/Pain Pain Assessment: 0-10 Pain Score: 5  Faces Pain Scale: Hurts little more Pain Location: L hip Pain Descriptors / Indicators: Aching;Sore Pain Intervention(s): Limited activity within patient's tolerance;Monitored during session;Repositioned    Home Living Family/patient expects to be discharged to:: Unsure Living Arrangements: Alone             Additional Comments: may stay with sister in law     Prior Function Level of Independence: Independent          PT Goals (current goals can now be found in the care plan section) Acute Rehab PT Goals Patient Stated Goal: not be a burden on anyone    Frequency    Min 3X/week      PT Plan      Co-evaluation              AM-PAC PT "6 Clicks" Daily Activity  Outcome Measure  Difficulty turning over in bed (including adjusting bedclothes, sheets and blankets)?: Unable Difficulty moving from lying on back to sitting on the side of the bed? : Unable Difficulty sitting down on and standing up from a chair with arms (e.g., wheelchair, bedside commode, etc,.)?: Unable Help needed moving to and from a bed to chair (including a wheelchair)?: A Lot Help needed walking in hospital room?: A Little Help needed climbing 3-5 steps with a railing? : A Lot 6 Click Score: 10    End of Session Equipment Utilized During Treatment: Gait belt Activity Tolerance: Patient limited by fatigue;Patient limited by pain Patient left: in chair;with call bell/phone  within reach;with family/visitor present Nurse Communication: Mobility status PT Visit Diagnosis: Unsteadiness on feet (R26.81);Difficulty in walking, not elsewhere classified (R26.2);Pain Pain - Right/Left: Left Pain - part of body: Hip     Time: 1415-1440 PT Time Calculation (min) (ACUTE ONLY): 25 min  Charges:  $Therapeutic Activity: 8-22 mins                    G Codes:       Almond Lint, SPTA [facility] [place]  PTA [facility]  Acute  Rehab Pager      [phone]

## 2017-01-18 NOTE — Progress Notes (Signed)
PMT progress note  Patient seen and examined, nephew at bedside Chart reviewed Pain reasonably well controlled Having regular BMs now Was nauseated earlier this am, received IV PRN medication.   BP 128/77 (BP Location: Right Arm)   Pulse 76   Temp 98 F (36.7 C) (Oral)   Resp 12   SpO2 99%  Labs and imaging noted Path pending on the biopsy  Awake alert S1 S2 Clear Abdomen soft Trace edema Non focal Some pain in mid to lower back and L hip area.   L hip pain History of breast ca Possible mets to L4 and femur  PLAN: Long discussions with patient about goals of care. Patient still not in favor of surgery, doubt she ever will be. She is awaiting biopsy results, wants her doctors to frankly discuss prognosis with her.   Agree with current pain and non pain regimen.  Disposition: we discussed pros and cons of SNF rehab with palliative versus home with hospice in detail. Patient lives by herself in [place], [place], how ever her family lives nearby, she is considering moving in with her sister Khalil Belote who lives nearby. Patient to continue to decide.   25 minutes spent.  Loistine Chance MD [facility] health palliative medicine team [phone]

## 2017-01-18 NOTE — Consult Note (Signed)
   [facility] CM Inpatient Consult   01/18/2017  Theresa Levine [facility] 08-15-1950 [id number]     Patient screened for potential [facility] Care Management services.  Chart reviewed. Therapy recommendations are for SNF. Also noted palliative medicine is following.  Will continue to follow along and engage patient for potential [facility] Care Management program services once disposition plans are more clear and if [facility] Care Management services are deemed appropriate.    Marthenia Rolling, MSN-Ed, RN,BSN [facility] Liaison [phone]

## 2017-01-19 ENCOUNTER — Inpatient Hospital Stay (HOSPITAL_COMMUNITY): Payer: PPO

## 2017-01-19 DIAGNOSIS — M25552 Pain in left hip: Secondary | ICD-10-CM

## 2017-01-19 DIAGNOSIS — C7951 Secondary malignant neoplasm of bone: Principal | ICD-10-CM

## 2017-01-19 DIAGNOSIS — R52 Pain, unspecified: Secondary | ICD-10-CM

## 2017-01-19 DIAGNOSIS — M7989 Other specified soft tissue disorders: Secondary | ICD-10-CM

## 2017-01-19 DIAGNOSIS — Z515 Encounter for palliative care: Secondary | ICD-10-CM

## 2017-01-19 DIAGNOSIS — C50912 Malignant neoplasm of unspecified site of left female breast: Secondary | ICD-10-CM

## 2017-01-19 DIAGNOSIS — I340 Nonrheumatic mitral (valve) insufficiency: Secondary | ICD-10-CM

## 2017-01-19 LAB — CBC
HCT: 30.2 % — ABNORMAL LOW (ref 36.0–46.0)
Hemoglobin: 10.1 g/dL — ABNORMAL LOW (ref 12.0–15.0)
MCH: 29.7 pg (ref 26.0–34.0)
MCHC: 33.4 g/dL (ref 30.0–36.0)
MCV: 88.8 fL (ref 78.0–100.0)
PLATELETS: 141 10*3/uL — AB (ref 150–400)
RBC: 3.4 MIL/uL — ABNORMAL LOW (ref 3.87–5.11)
RDW: 15.4 % (ref 11.5–15.5)
WBC: 5.2 10*3/uL (ref 4.0–10.5)

## 2017-01-19 LAB — BASIC METABOLIC PANEL
ANION GAP: 5 (ref 5–15)
BUN: 8 mg/dL (ref 6–20)
CALCIUM: 8.6 mg/dL — AB (ref 8.9–10.3)
CO2: 25 mmol/L (ref 22–32)
Chloride: 102 mmol/L (ref 101–111)
Creatinine, Ser: 0.76 mg/dL (ref 0.44–1.00)
GFR calc Af Amer: >60 mL/min (ref >60)
GLUCOSE: 88 mg/dL (ref 65–99)
Potassium: 3.5 mmol/L (ref 3.5–5.1)
Sodium: 132 mmol/L — ABNORMAL LOW (ref 135–145)

## 2017-01-19 LAB — ECHOCARDIOGRAM COMPLETE
Height: 66 in
WEIGHTICAEL: 2880 [oz_av]

## 2017-01-19 LAB — MAGNESIUM: Magnesium: 1.2 mg/dL — ABNORMAL LOW (ref 1.7–2.4)

## 2017-01-19 MED ORDER — FUROSEMIDE 10 MG/ML IJ SOLN
20.0000 mg | Freq: Once | INTRAMUSCULAR | Status: AC
  Administered 2017-01-19: 20 mg via INTRAVENOUS
  Filled 2017-01-19: qty 2

## 2017-01-19 MED ORDER — MAGNESIUM SULFATE 50 % IJ SOLN
6.0000 g | Freq: Once | INTRAVENOUS | Status: AC
  Administered 2017-01-19: 6 g via INTRAVENOUS
  Filled 2017-01-19: qty 10

## 2017-01-19 NOTE — Progress Notes (Signed)
  Echocardiogram 2D Echocardiogram has been performed.  Theresa Levine M 01/19/2017, 1:17 PM

## 2017-01-19 NOTE — Progress Notes (Addendum)
PROGRESS NOTE  Theresa Levine [facility]  BTD:[id number] DOB: 30-May-1950 DOA: 01/12/2017 PCP: Aletha Halim., PA-C  Brief Narrative:   66 y.o.WF PMHx LEFT Breast cancer (S/P XRT left side of the chest in 2008) - she noted to me she received chemo/radiation/lumpectomy with last therapy being in the 90's), SVT, HTN, HLD, has been seen in ortho specialist office Dr. Rolena Infante for evaluation of right hip pain that has been getting progressively worse over the past week. Patient describes pain as constant and sharp, worse with weight bearing and associated with occasional cramping, numbness in the right hip area. Patient reports trying vicodin but it has not helped with pain. Patient was found to have metastatic lesion in the left femur with impeding fracture.  She met with Dr. Alen Blew on 11/15.  He suggested either aggressive approach with hip surgery followed by radiation and palliative chemotherapy versus conservative treatment with palliative radiation and hospice.  The patient has had a hard time deciding which of these options she would like to choose.  She is asking to speak with orthopedic surgeon again today.  She is also concerned because she has noticed worsening bilateral lower extremity swelling with a tight sensation in her legs.   Assessment & Plan:   Principal Problem:   Breast cancer metastasized to bone/Bone metastases/Bony metastasis/H/o Breast Cancer Active Problems:   Back pain/Leg pain   Pain   Bone lesion   Cancer (HCC)   Left hip pain   Encounter for palliative care   Goals of care, counseling/discussion  Multiple Bone Lesions/(large lesion LEFT Acetabulum/distal LEFT femur), likely metastatic breast cancer -Per EMR impending fracture Dr. Regan Rakers Orthopedic surgery recommends prophylactic IM  -Continue TLSO brace per orthopedic surgery -Continue nonweightbearing for now -MRI brain negative for metastases -L4 biopsy results: Positive Metastatic adenocarcinoma,  likely breast cancer  -Patient provided all information and is trying to decide on treatment i.e. Surgery + chemotherapy + XRT  vs palliative XRT.  -  Appreciate Oncology and orthopedic assistance  Uncontrolled Pain -Fentanyl patch 50 g q 72 hr -OxyIR 5 mg q 4hr PRN -Do not restart Dilaudid IV. Increase PO medication if required.   Anxiety - Ativan 0.5 mg BID -Paxil 20 mg daily  Hyponatremia:  Improved with IVF  Bilateral lower extremity swelling -  Duplex lower extremity -  Lasix 10m IV once -  ECHO once -  D/c IVF -  Repeat BMP in AM  UTI positive Klebsiella pneumoniae -Complete 5 day course antibiotics    Acute renal failure -Resolved  Essential HTN,   -Toprol 100 mg daily  Hypothyroidism - continue synthroid, increase to 37.5 mcg, f/u in about 6 weeks repeat TSH  Smoker - provide nicotine patch   Hypomagnesemia, given IV magnesium repletion  DVT prophylaxis: Lovenox Code Status: Full code Family Communication: Patient alone Disposition Plan: Likely discharge in 1-2 days to home with family with 24-hour supervision and home health services.   Consultants:   Palliative care  Oncology  Orthopedic surgery, Dr. RStann Mainland Procedures:  None  Antimicrobials:  Anti-infectives (From admission, onward)   Start     Dose/Rate Route Frequency Ordered Stop   01/15/17 1000  cefTRIAXone (ROCEPHIN) 1 g in dextrose 5 % 50 mL IVPB     1 g 100 mL/hr over 30 Minutes Intravenous Every 24 hours 01/15/17 0925 01/19/17 1324       Subjective:  Patient continues to have significant pain in the left hip when she is ambulating and she  also has pain in her right anterior shoulder particularly when she is using her walker.  Ambulation is very difficult.  She had to return to her room in a wheelchair after working with PT yesterday.  She is also concerned because she has bilateral lower extremity swelling which is new.  She has not decided about whether she wants  surgery or not.    Objective: Vitals:   01/18/17 2024 01/19/17 0100 01/19/17 0515 01/19/17 1844  BP: 104/66  110/65 106/65  Pulse: 60  64 (!) 57  Resp: '18  12 16  ' Temp: 97.6 F (36.4 C)  97.8 F (36.6 C) (!) 97.4 F (36.3 C)  TempSrc: Oral  Oral Oral  SpO2: 97%  98% 97%  Weight:  81.6 kg (180 lb)    Height:  '5\' 6"'  (1.676 m)      Intake/Output Summary (Last 24 hours) at 01/19/2017 1856 Last data filed at 01/19/2017 1800 Gross per 24 hour  Intake 240 ml  Output -  Net 240 ml   Filed Weights   01/19/17 0100  Weight: 81.6 kg (180 lb)    Examination:  General exam:  Adult female.  No acute distress.  HEENT:  NCAT, MMM Respiratory system: Clear to auscultation bilaterally Cardiovascular system: Regular rate and rhythm, normal S1/S2. No murmurs, rubs, gallops or clicks.  Warm extremities Gastrointestinal system: Normal active bowel sounds, soft, nondistended, nontender. MSK:  Normal tone and bulk, no lower extremity edema, painful with ROM of the bilateral lower extremities and right arm.  2+ pitting bilateral lower extremity edema Neuro:  Grossly intact    Data Reviewed: I have personally reviewed following labs and imaging studies  CBC: Recent Labs  Lab 01/15/17 0408 01/16/17 0507 01/17/17 0409 01/18/17 0401 01/19/17 0414  WBC 5.1 5.1 4.9 5.2 5.2  HGB 11.5* 11.0* 11.0* 10.7* 10.1*  HCT 33.5* 32.1* 32.5* 31.8* 30.2*  MCV 86.3 87.0 88.3 87.8 88.8  PLT 173 159 136* 146* 867*   Basic Metabolic Panel: Recent Labs  Lab 01/15/17 0408 01/16/17 0408 01/17/17 0409 01/18/17 0401 01/19/17 0414  NA 132* 133* 134* 132* 132*  K 3.8 4.7 3.7 3.5 3.5  CL 99* 102 105 103 102  CO2 '23 22 23 ' 21* 25  GLUCOSE 88 74 76 89 88  BUN '17 10 9 9 8  ' CREATININE 0.92 0.77 0.77 0.74 0.76  CALCIUM 9.1 8.9 9.0 8.6* 8.6*  MG  --   --   --  1.1* 1.2*   GFR: Estimated Creatinine Clearance: 72.5 mL/min (by C-G formula based on SCr of 0.76 mg/dL). Liver Function Tests: No results for  input(s): AST, ALT, ALKPHOS, BILITOT, PROT, ALBUMIN in the last 168 hours. No results for input(s): LIPASE, AMYLASE in the last 168 hours. No results for input(s): AMMONIA in the last 168 hours. Coagulation Profile: Recent Labs  Lab 01/15/17 0920  INR 1.04   Cardiac Enzymes: No results for input(s): CKTOTAL, CKMB, CKMBINDEX, TROPONINI in the last 168 hours. BNP (last 3 results) No results for input(s): PROBNP in the last 8760 hours. HbA1C: No results for input(s): HGBA1C in the last 72 hours. CBG: No results for input(s): GLUCAP in the last 168 hours. Lipid Profile: No results for input(s): CHOL, HDL, LDLCALC, TRIG, CHOLHDL, LDLDIRECT in the last 72 hours. Thyroid Function Tests: No results for input(s): TSH, T4TOTAL, FREET4, T3FREE, THYROIDAB in the last 72 hours. Anemia Panel: No results for input(s): VITAMINB12, FOLATE, FERRITIN, TIBC, IRON, RETICCTPCT in the last 72 hours. Urine analysis:  Component Value Date/Time   COLORURINE YELLOW (A) 01/15/2017 0445   APPEARANCEUR TURBID (A) 01/15/2017 0445   LABSPEC 1.014 01/15/2017 0445   PHURINE 5.0 01/15/2017 0445   GLUCOSEU NEGATIVE 01/15/2017 0445   HGBUR SMALL (A) 01/15/2017 0445   BILIRUBINUR NEGATIVE 01/15/2017 0445   KETONESUR NEGATIVE 01/15/2017 0445   PROTEINUR 30 (A) 01/15/2017 0445   NITRITE POSITIVE (A) 01/15/2017 0445   LEUKOCYTESUR LARGE (A) 01/15/2017 0445   Sepsis Labs: '@LABRCNTIP' (procalcitonin:4,lacticidven:4)  ) Recent Results (from the past 240 hour(s))  Culture, Urine     Status: Abnormal   Collection Time: 01/15/17  9:30 AM  Result Value Ref Range Status   Specimen Description URINE, CLEAN CATCH  Final   Special Requests NONE  Final   Culture >=100,000 COLONIES/mL KLEBSIELLA PNEUMONIAE (A)  Final   Report Status 01/18/2017 FINAL  Final   Organism ID, Bacteria KLEBSIELLA PNEUMONIAE (A)  Final      Susceptibility   Klebsiella pneumoniae - MIC*    AMPICILLIN >=32 RESISTANT Resistant     CEFAZOLIN  <=4 SENSITIVE Sensitive     CEFTRIAXONE <=1 SENSITIVE Sensitive     CIPROFLOXACIN <=0.25 SENSITIVE Sensitive     GENTAMICIN <=1 SENSITIVE Sensitive     IMIPENEM <=0.25 SENSITIVE Sensitive     NITROFURANTOIN 32 SENSITIVE Sensitive     TRIMETH/SULFA <=20 SENSITIVE Sensitive     AMPICILLIN/SULBACTAM 4 SENSITIVE Sensitive     PIP/TAZO <=4 SENSITIVE Sensitive     Extended ESBL NEGATIVE Sensitive     * >=100,000 COLONIES/mL KLEBSIELLA PNEUMONIAE      Radiology Studies: No results found.   Scheduled Meds: . acetaminophen  1,000 mg Oral Q8H  . aspirin EC  81 mg Oral Daily  . enoxaparin (LOVENOX) injection  40 mg Subcutaneous Q24H  . feeding supplement (ENSURE ENLIVE)  237 mL Oral BID BM  . fentaNYL  50 mcg Transdermal Q72H  . levothyroxine  37.5 mcg Oral QAC breakfast  . LORazepam  0.5 mg Oral BID  . nicotine  21 mg Transdermal Daily  . pantoprazole sodium  40 mg Oral Daily  . PARoxetine  20 mg Oral Daily  . senna  2 tablet Oral QHS   Continuous Infusions:   LOS: 7 days    Time spent: 30 min    Janece Canterbury, MD [facility] Pager [phone]  If 7PM-7AM, please contact night-coverage [email or web address] Password [facility] 01/19/2017, 6:56 PM

## 2017-01-19 NOTE — Progress Notes (Signed)
Bilateral lower extremity venous duplex completed. No evidence of DVT, superficial thrombosis, or Baker's cyst  [facility], RVS 01/19/2017 3:41 pm

## 2017-01-19 NOTE — Progress Notes (Signed)
PMT progress note  Patient seen and examined, sister in law at bedside Chart reviewed Pain reasonably well controlled Having regular BMs now  denies pain.   BP 110/65 (BP Location: Right Arm)   Pulse 64   Temp 97.8 F (36.6 C) (Oral)   Resp 12   Ht 5\' 6"  (1.676 m)   Wt 81.6 kg (180 lb)   SpO2 98%   BMI 29.05 kg/m  Labs and imaging noted Path  Biopsy reports noted.   Awake alert S1 S2 Clear Abdomen soft Trace edema Non focal Some pain in mid to lower back and L hip area.   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   PLAN: Long discussions with patient about goals of care. Appreciate Dr Alen Blew discussing with patient on 01-18-17.  Patient wishes to discuss again with orthopedic surgery.   We did frankly discuss prognosis being limited given above.   Agree with current pain and non pain regimen.  Disposition: we discussed again about her option of proceeding with surgery, then SNF rehab with palliative, followed by radiation and palliative chemotherapy as Dr Alen Blew has also discussed with her versus home with hospice in detail. Patient lives by herself in [place], [place], how ever her family lives nearby, she is considering moving in with her sister Leina Babe who lives nearby. Patient to continue to decide. She wants to talk with orthopedic surgery again.   25 minutes spent.  Loistine Chance MD [facility] health palliative medicine team [phone]

## 2017-01-19 NOTE — Clinical Social Work Note (Signed)
Clinical Social Work Assessment  Patient Details  Name: Theresa Levine MRN: [id number] Date of Birth: June 29, 1950  Date of referral:  01/18/17               Reason for consult:  Discharge Planning                Permission sought to share information with:  Family Supports Permission granted to share information::  Yes, Verbal Permission Granted  Name::     sister Theresa Levine and Theresa Levine::     Relationship::     Contact Information:     Housing/Transportation Living arrangements for the past 2 months:  Single Family Home Source of Information:  Patient, Medical Team, Palliative Care Team(sister, Levine) Patient Interpreter Needed:  None Criminal Activity/Legal Involvement Pertinent to Current Situation/Hospitalization:  No - Comment as needed Significant Relationships:  Friend, Other Family Members, Siblings Lives with:  Self Do you feel safe going back to the place where you live?  Yes Need for family participation in patient care:  No (Coment)  Care giving concerns:  Pt from home where she resides alone. Has strong family support. Currently needing assistance with ambulation and some ADLs.   Social Worker assessment / plan:  CSW consulted to assist with DC planning. Met with pt to discuss her wishes/plans. Pt's Levine also present and sister has been involved as well, pt reports welcoming their input in planning. CSW discussed with pt how PT has recommended SNF for pt, however pt is currently deciding whether or not to pursue surgery given that her treatment options at this point are palliative. After discussing possible scenarios at length with pt, she summarizes that if she decides to have surgery, she would likely want to have rehab afterward in order to attempt gaining strength and going back to her (or her family's) home. If she decides to decline surgery and opt for hospice/palliative care at home, she "would want to go home and spend my time with my family." Discussed that  her sister-in-law has stated that she wants pt to come move into her home and have hospice services and private duty aides assist in her care. CSW spent some time explaining the SNF referral process and barriers if hospice involved. Pt understanding and very much focused on making the decision which will "give her the best of the time she has left. I need to think about cost as well."  Plan: TBD- pt's disposition dependent on her decision making with family and medical team. CSW following to assist.   Employment status:  Retired Nurse, adult PT Recommendations:  [place] / Referral to community resources:     Patient/Family's Response to care:  Very engaged and appreciative of care  Patient/Family's Understanding of and Emotional Response to Diagnosis, Current Treatment, and Prognosis:  Demonstrates adequate understanding of her prognosis and potential plans/barriers. Emotionally very well-adjusted, speaks about her quality of life and wanting to spend remaining time as wisely as possible. Pt's Levine demonstrated understanding as well and was supportive of pt's wishes while maintaining that family's goal is to have pt move into their home at DC.   Emotional Assessment Appearance:  Appears stated age Attitude/Demeanor/Rapport:  (appropriate, engaged) Affect (typically observed):  Accepting, Calm Orientation:  Oriented to Self, Oriented to Place, Oriented to  Time, Oriented to Situation Alcohol / Substance use:  Not Applicable Psych involvement (Current and /or in the community):  No (Comment)  Discharge Needs  Concerns  to be addressed:  Decision making concerns, Discharge Planning Concerns Readmission within the last 30 days:  No Current discharge risk:  None Barriers to Discharge:  No Barriers Identified   Theresa Nephew, LCSW 01/19/2017, 8:44 AM [phone]

## 2017-01-19 NOTE — Progress Notes (Signed)
Physical Therapy Treatment Patient Details Name: Theresa Levine MRN: [id number] DOB: 1950/11/11 Today's Date: 01/19/2017    History of Present Illness Pt admitted with c/o L hip pain.  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    PT Comments    Patient was OOB in the bathroom with family member without TLSO, assisted to recliner with patient reporting fatigue. Positioned with pillows for pressure relief and comfort. Applied patients TLSO and educated family member on TLSO to be worn all times OOB and PWB status.   Family stated, "she really had to go to the bathroom" and just came from the bed.     Follow Up Recommendations  SNF   If pt decides to have hip surgery HH     If pt decides not to have hip surgery     Equipment Recommendations  None recommended by PT    Recommendations for Other Services OT consult     Precautions / Restrictions Precautions Precautions: Fall;Back Precaution Comments: TLSO Required Braces or Orthoses: Spinal Brace Spinal Brace: Thoracolumbosacral orthotic Restrictions Weight Bearing Restrictions: Yes LLE Weight Bearing: Partial weight bearing LLE Partial Weight Bearing Percentage or Pounds: 50%    Mobility  Bed Mobility               General bed mobility comments: OOB  Transfers Overall transfer level: Needs assistance Equipment used: Rolling walker (2 wheeled) Transfers: Sit to/from Stand Sit to Stand: Min assist         General transfer comment: 25% VC's for hand placement  Ambulation/Gait Ambulation/Gait assistance: Min assist Ambulation Distance (Feet): 5 Feet Assistive device: Rolling walker (2 wheeled) Gait Pattern/deviations: Step-to pattern;Decreased step length - right;Decreased step length - left;Shuffle;Trunk flexed Gait velocity: decreased Gait velocity interpretation: Below normal speed for age/gender General Gait Details: 25%  VC's for PWB, instructed family member on TLSO   Stairs            Wheelchair Mobility    Modified Rankin (Stroke Patients Only)       Balance                                            Cognition Arousal/Alertness: Awake/alert Behavior During Therapy: WFL for tasks assessed/performed Overall Cognitive Status: Within Functional Limits for tasks assessed                                        Exercises      General Comments        Pertinent Vitals/Pain Pain Score: 8  Pain Location: L hip Pain Descriptors / Indicators: Aching;Sore Pain Intervention(s): Limited activity within patient's tolerance;Monitored during session;Repositioned    Home Living                      Prior Function            PT Goals (current goals can now be found in the care plan section)      Frequency    Min 3X/week      PT Plan      Co-evaluation              AM-PAC PT "6 Clicks"  Daily Activity  Outcome Measure  Difficulty turning over in bed (including adjusting bedclothes, sheets and blankets)?: Unable Difficulty moving from lying on back to sitting on the side of the bed? : Unable Difficulty sitting down on and standing up from a chair with arms (e.g., wheelchair, bedside commode, etc,.)?: Unable Help needed moving to and from a bed to chair (including a wheelchair)?: A Lot Help needed walking in hospital room?: A Little Help needed climbing 3-5 steps with a railing? : A Lot 6 Click Score: 10    End of Session Equipment Utilized During Treatment: Gait belt Activity Tolerance: Patient limited by fatigue;Patient limited by pain Patient left: in chair;with call bell/phone within reach;with family/visitor present Nurse Communication: Mobility status PT Visit Diagnosis: Unsteadiness on feet (R26.81);Difficulty in walking, not elsewhere classified (R26.2);Pain Pain - Right/Left: Left Pain - part of body: Hip     Time:  1345-1400 PT Time Calculation (min) (ACUTE ONLY): 15 min  Charges:  $Self Care/Home Management: 2022/10/30                    G Codes:       Theresa Levine, SPTA [facility] [place]  PTA [facility]  Acute  Rehab Pager      [phone]

## 2017-01-20 LAB — HEPATIC FUNCTION PANEL
ALK PHOS: 75 U/L (ref 38–126)
ALT: 14 U/L (ref 14–54)
AST: 20 U/L (ref 15–41)
Albumin: 3.2 g/dL — ABNORMAL LOW (ref 3.5–5.0)
BILIRUBIN DIRECT: 0.1 mg/dL (ref 0.1–0.5)
BILIRUBIN INDIRECT: 0.2 mg/dL — AB (ref 0.3–0.9)
BILIRUBIN TOTAL: 0.3 mg/dL (ref 0.3–1.2)
Total Protein: 6.4 g/dL — ABNORMAL LOW (ref 6.5–8.1)

## 2017-01-20 LAB — MAGNESIUM: Magnesium: 2.1 mg/dL (ref 1.7–2.4)

## 2017-01-20 LAB — BASIC METABOLIC PANEL
Anion gap: 9 (ref 5–15)
BUN: 6 mg/dL (ref 6–20)
CALCIUM: 9 mg/dL (ref 8.9–10.3)
CO2: 27 mmol/L (ref 22–32)
CREATININE: 0.7 mg/dL (ref 0.44–1.00)
Chloride: 96 mmol/L — ABNORMAL LOW (ref 101–111)
GFR calc non Af Amer: >60 mL/min (ref >60)
GLUCOSE: 89 mg/dL (ref 65–99)
Potassium: 3.4 mmol/L — ABNORMAL LOW (ref 3.5–5.1)
Sodium: 132 mmol/L — ABNORMAL LOW (ref 135–145)

## 2017-01-20 MED ORDER — LORAZEPAM 0.5 MG PO TABS: 0.5000 mg | ORAL_TABLET | Freq: Two times a day (BID) | ORAL | 0 refills | Status: DC

## 2017-01-20 MED ORDER — FENTANYL 50 MCG/HR TD PT72: 50.0000 ug | MEDICATED_PATCH | TRANSDERMAL | 0 refills | Status: DC

## 2017-01-20 MED ORDER — PAROXETINE HCL 20 MG PO TABS: 20.0000 mg | ORAL_TABLET | Freq: Every day | ORAL | 0 refills | Status: AC

## 2017-01-20 MED ORDER — FUROSEMIDE 10 MG/ML IJ SOLN
40.0000 mg | Freq: Once | INTRAMUSCULAR | Status: AC
  Administered 2017-01-20: 40 mg via INTRAVENOUS
  Filled 2017-01-20: qty 4

## 2017-01-20 MED ORDER — ENSURE ENLIVE PO LIQD: 237.0000 mL | Freq: Two times a day (BID) | ORAL | 0 refills | Status: AC

## 2017-01-20 MED ORDER — LEVOTHYROXINE SODIUM 75 MCG PO TABS
37.5000 ug | ORAL_TABLET | Freq: Every day | ORAL | 0 refills | Status: AC
Start: 2017-01-21 — End: ?

## 2017-01-20 MED ORDER — OXYCODONE HCL 5 MG PO TABS: 5.0000 mg | ORAL_TABLET | ORAL | 0 refills | Status: DC | PRN

## 2017-01-20 MED ORDER — FUROSEMIDE 40 MG PO TABS: 40.0000 mg | ORAL_TABLET | Freq: Every day | ORAL | 0 refills | Status: DC

## 2017-01-20 MED ORDER — POTASSIUM CHLORIDE CRYS ER 20 MEQ PO TBCR: 20.0000 meq | EXTENDED_RELEASE_TABLET | Freq: Every day | ORAL | 0 refills | Status: DC

## 2017-01-20 MED ORDER — POTASSIUM CHLORIDE CRYS ER 20 MEQ PO TBCR: 20.0000 meq | EXTENDED_RELEASE_TABLET | Freq: Every day | ORAL | Status: DC

## 2017-01-20 MED ORDER — POTASSIUM CHLORIDE CRYS ER 20 MEQ PO TBCR
40.0000 meq | EXTENDED_RELEASE_TABLET | Freq: Once | ORAL | Status: AC
  Administered 2017-01-20: 40 meq via ORAL
  Filled 2017-01-20: qty 2

## 2017-01-20 MED ORDER — NICOTINE 21 MG/24HR TD PT24: 21.0000 mg | MEDICATED_PATCH | Freq: Every day | TRANSDERMAL | 0 refills | Status: AC

## 2017-01-20 NOTE — Discharge Summary (Signed)
Physician Discharge Summary  Theresa Levine [facility] EUM:[id number] DOB: [birth year] DOA: 01/12/2017  PCP: Aletha Halim., PA-C  Admit date: 01/12/2017 Discharge date: 01/20/2017  Admitted From: home  Disposition:  home  Recommendations for Outpatient Follow-up:  1. Follow up with Dr. Alen Blew in 1 weeks for [facility] and for consideration of palliative chemo/XRT 2. Please obtain BMP in one week after starting diuretic to check potassium and creatinine 3. Dr. Stann Mainland in 2 weeks to discuss surgery vs. No surgery 4. Left lower extremity with 50% weightbearing 5. PCP in 1 month to repeat TSH  Home Health:  PT/OT  Equipment/Devices:  TLSO brace to be worn at all times out of bed.  Already has walker at home.    Discharge Condition:  Stable, improved CODE STATUS: Full code Diet recommendation: Regular diet  Brief/Interim Summary:  66 y.o.WF PMHx LEFT Breast cancer (S/P XRT left side of the chest in 2008) - she noted to me she received chemo/radiation/lumpectomy with last therapy being in the 90's), SVT, HTN, HLD, has been seen in ortho specialist office Dr. Merrilee Jansky evaluation of right hip pain that has been getting progressively worse over the past week. Patient describes pain as constant and sharp, worse with weight bearing and associated with occasional cramping, numbness in the right hip area. Patient reports trying vicodin but it has not helped with pain. Patient was found to have metastatic lesion in the left femur with impeding fracture.  She had a biopsy of a vertebral mass on 11/12 by IR.  Pathology revealed metastatic adenocarcinoma, possibly breast cancer.  She met with Dr. Alen Blew on 11/15.  He suggested either aggressive approach with hip surgery followed by radiation and palliative chemotherapy versus conservative treatment with palliative radiation and hospice.  The patient has had a hard time deciding which of these options she would like to choose.  She is asking to speak with orthopedic  surgeon again today.  She is also concerned because she has noticed worsening bilateral lower extremity swelling with a tight sensation in her legs.  She was given lasix 93m IV once without results, but 415mIV lasix provided some diuresis.  Her albumin is only minimally decreased.  Duplex lower extremities was negative for DVT.  ECHO demonstrated EF 55-60% and mild MR, but unable to comment on diastolic dysfunction.  Advised her to wear TED hose and gave RX for lasix to continue at home.  She will need repeat blood work in 1 week.      Discharge Diagnoses:  Principal Problem:   Breast cancer metastasized to bone/Bone metastases/Bony metastasis/H/o Breast Cancer Active Problems:   Back pain/Leg pain   Pain   Bone lesion   Cancer (HCC)   Left hip pain   Encounter for palliative care   Goals of care, counseling/discussion   Hypomagnesemia  Multiple Bone Lesions/(large lesion LEFT Acetabulum/distal LEFT femur), likely metastatic breast cancer, and vertebral involvement -Continue TLSO brace per orthopedic surgery -  Partial weight-bearing LLE 50%  -MRI brain negative for metastases -L4 biopsy results: Positive Metastatic adenocarcinoma, likely breast cancer  -Patient provided all information and is trying to decide on treatment i.e. Surgery + chemotherapy +XRT vspalliative XRT.  -  Appreciate Oncology and orthopedic assistance  Uncontrolled Pain -Fentanylpatch 50 g q 72 hr -OxyIR 5 mgq 4hr PRN  Anxiety - Ativan 0.5 mg BID -Paxil 20 mg daily  Hyponatremia:  Improved with IVF  Bilateral lower extremity swelling -  Duplex lower extremity negative -  Lasix 2082mV once  did not help -  ECHO without systolic dysfunction, could not discern diastolic dysfunction -  Albumin 3.2 -  Lasix 63m once daily with potassium 229m daily at home with labs in 1 week  -  TED hose  UTIpositiveKlebsiella pneumoniae -Completed 5 day course antibiotics  Acute renal  failure -Resolved  Essential HTN with low normal BP and bradycardia -Toprol 100 mg discontinued  Hypothyroidism, TSH 8.829 on 11/10 - synthroid increased to 37.5 mcg, f/u in 4 weeks repeat TSH  Smoker - provide nicotine patch   Hypomagnesemia, given IV magnesium repletion    Discharge Instructions  Discharge Instructions    Call MD for:  difficulty breathing, headache or visual disturbances   Complete by:  As directed    Call MD for:  extreme fatigue   Complete by:  As directed    Call MD for:  persistant dizziness or light-headedness   Complete by:  As directed    Call MD for:  persistant nausea and vomiting   Complete by:  As directed    Call MD for:  severe uncontrolled pain   Complete by:  As directed    Diet general   Complete by:  As directed    Increase activity slowly   Complete by:  As directed        Medication List    STOP taking these medications   acetaminophen 500 MG tablet Commonly known as:  TYLENOL   ALPRAZolam 0.25 MG tablet Commonly known as:  XANAX   HYDROcodone-acetaminophen 5-325 MG tablet Commonly known as:  NORCO/VICODIN   losartan 50 MG tablet Commonly known as:  COZAAR   meclizine 25 MG tablet Commonly known as:  ANTIVERT   metoprolol succinate 100 MG 24 hr tablet Commonly known as:  TOPROL-XL   traMADol 50 MG tablet Commonly known as:  ULTRAM     TAKE these medications   aspirin 81 MG tablet Take 81 mg by mouth daily.   esomeprazole 20 MG capsule Commonly known as:  NEXIUM Take 40 mg daily at 12 noon by mouth.   feeding supplement (ENSURE ENLIVE) Liqd Take 237 mLs 2 (two) times daily between meals by mouth.   fentaNYL 50 MCG/HR Commonly known as:  DURAGESIC - dosed mcg/hr Place 1 patch (50 mcg total) every 3 (three) days onto the skin.   furosemide 40 MG tablet Commonly known as:  LASIX Take 1 tablet (40 mg total) daily for 7 days by mouth.   levothyroxine 75 MCG tablet Commonly known as:   LEVOTHROID Take 0.5 tablets (37.5 mcg total) daily before breakfast by mouth. Start taking on:  01/21/2017 What changed:    medication strength  how much to take  when to take this   LORazepam 0.5 MG tablet Commonly known as:  ATIVAN Take 1 tablet (0.5 mg total) 2 (two) times daily by mouth.   nicotine 21 mg/24hr patch Commonly known as:  NICODERM CQ - dosed in mg/24 hours Place 1 patch (21 mg total) daily onto the skin. Start taking on:  01/21/2017   oxyCODONE 5 MG immediate release tablet Commonly known as:  Oxy IR/ROXICODONE Take 1 tablet (5 mg total) every 4 (four) hours as needed by mouth for moderate pain.   PARoxetine 20 MG tablet Commonly known as:  PAXIL Take 1 tablet (20 mg total) daily by mouth. Start taking on:  01/21/2017   potassium chloride SA 20 MEQ tablet Commonly known as:  K-DUR,KLOR-CON Take 1 tablet (20 mEq total) daily by mouth.  Start taking on:  01/21/2017      Follow-up Information    Aletha Halim., PA-C. Schedule an appointment as soon as possible for a visit in 4 week(s).   Specialty:  Family Medicine Contact information: [address] [place] [address] [phone]        Wyatt Portela, MD. Schedule an appointment as soon as possible for a visit in 1 week(s).   Specialty:  Oncology Why:  BMP and chemo/XRT Contact information: [place] [address] [phone]        Nicholes Stairs, MD. Schedule an appointment as soon as possible for a visit in 2 week(s).   Specialty:  Orthopedic Surgery Contact information: [address] [place] [address] [phone]          Allergies  Allergen Reactions  . Crestor [Rosuvastatin Calcium] Other (See Comments)    Myalgias  . Ezetimibe     Other reaction(s): GI Upset (intolerance)  . Rosuvastatin Calcium     Other reaction(s): Other (See Comments) Myalgias  . Sulfa Antibiotics   . Sulfacetamide Sodium     Other  reaction(s): Other (See Comments)    Consultations: Orthopedic surgery, Dr. Rolena Infante and Dr. Stann Mainland Interventional radiology Palliative care Oncology, Dr. Alen Blew   Procedures/Studies: Ct Lumbar Spine W Contrast  Result Date: 01/09/2017 CLINICAL DATA:  Metastatic breast cancer, LEFT-sided low back pain. EXAM: CT LUMBAR SPINE WITH CONTRAST TECHNIQUE: Multidetector CT imaging of the lumbar spine was performed with intravenous contrast administration. CONTRAST:  82m ISOVUE-300 IOPAMIDOL (ISOVUE-300) INJECTION 61% COMPARISON:  PET scan earlier today. FINDINGS: Segmentation: Standard Alignment: No anterolisthesis. Slight degenerative scoliosis convex LEFT thoracolumbar junction. Vertebrae: Osseous destructive lesion of L4 on the LEFT extending into the pedicle, consistent with metastatic breast cancer. There is near complete destruction of the superior articular process, and pars interarticularis. Tumor also extends into the transverse process and paravertebral soft tissues. Paraspinal and other soft tissues: Aortic atherosclerosis. LEFT para-aortic lymph node at the L2 level is malignant. Disc levels: L1-L2:  Calcified protrusion.  No impingement. L2-L3: Calcified central and rightward protrusion. Facet arthropathy. RIGHT subarticular zone and foraminal zone narrowing. L3-L4: Annular bulge. Facet arthropathy. Tumor from the LEFT L4 superior articular process extends into the LEFT neural foramen and extraforaminal soft tissues. LEFT L3 and LEFT L4 neural impingement are likely. L4-L5: Calcified central and leftward protrusion. Mild stenosis. LEFT L4 metastasis extends into the pedicle and posterior elements, with inferior osseous destruction affecting the transverse process. Tumor involvement into the foramen is observed. LEFT L4 nerve root impingement related to epidural/perineural tumor is likely, possibly affecting the LEFT L5 nerve root. L5-S1:  Calcified protrusion, facet arthropathy.  No impingement.  IMPRESSION: Metastatic disease to the LEFT L4 vertebral body extending into the pedicle, transverse process, and posterior elements is likely contributory to the patient's LEFT-sided sciatica. LEFT-sided tumor extending into the foramen and extraforaminal soft tissues could affect the LEFT L3, LEFT L4, and LEFT L5 nerve roots. See discussion above. These results will be called to the ordering clinician or representative by the Radiologist Assistant, and communication documented in the PACS or zVision Dashboard. Electronically Signed   By: JStaci RighterM.D.   On: 01/09/2017 16:14   Ct Pelvis Wo Contrast  Result Date: 01/13/2017 CLINICAL DATA:  Bone lesion noted on recent PET-CT, incompletely imaged. EXAM: CT PELVIS WITHOUT CONTRAST CT OF THE LEFT FEMUR WITHOUT CONTRAST TECHNIQUE: Multidetector CT imaging of the pelvis was performed following the standard  protocol without intravenous contrast. COMPARISON:  PET-CT, 01/09/2017.  Left knee radiographs, 05/21/2015. FINDINGS: MUSCULOSKELETAL: Irregular lytic lesion in the left ilium, along the roof of the left acetabulum, measuring 2.3 cm. Lesion breaches the subcortical bone of the superior left acetabulum. No other pelvic lesions. Permeative lesion is seen within the cortex of the mid left femoral shaft, spanning approximately 6 cm in length, and expanding the cortical thickness 2 7 mm. There is associated increased attenuation within the medullary cavity of the mid femur. No associated soft tissue mass. No other bone lesions. SOFT TISSUES: Within the pelvis, there are no acute abnormalities. Multiple left colon diverticula are noted without diverticulitis. A normal appendix is visualized. Bladder, uterus and adnexa are unremarkable. No adenopathy. No free fluid. Left lower extremity soft tissues show scattered femoral artery vascular calcifications. No masses or adenopathy. Normal attenuation from the 5 musculature. No knee joint effusion. IMPRESSION: 1. The  lesion partly imaged on the recent PET-CT is a 6 cm long, permeative, cortically based bone lesion along the posterior mid shaft of the left femur, associated with increased attenuation in the medullary cavity. This is consistent with an additional focus of metastatic disease from breast carcinoma. 2. Metastatic, lytic/destructive lesion of the left ilium, involving the superior acetabulum, with resorption of portions of the superior acetabular subchondral bone. 3. No other metastatic bone lesions on the included field of view. Electronically Signed   By: Lajean Manes M.D.   On: 01/13/2017 10:41   Mr Jeri Cos UV Contrast  Result Date: 01/15/2017 CLINICAL DATA:  Staging.  Metastatic disease.  Breast cancer. EXAM: MRI HEAD WITHOUT AND WITH CONTRAST TECHNIQUE: Multiplanar, multiecho pulse sequences of the brain and surrounding structures were obtained without and with intravenous contrast. CONTRAST:  38m MULTIHANCE GADOBENATE DIMEGLUMINE 529 MG/ML IV SOLN COMPARISON:  PET scan from 01/09/2017 demonstrates osseous metastatic disease to the lumbar spine and pelvis FINDINGS: Brain: No evidence for acute infarction, hemorrhage, mass lesion, hydrocephalus, or extra-axial fluid. Mild atrophy is noted. Mild subcortical and periventricular T2 and FLAIR hyperintensities, likely chronic microvascular ischemic change. Post infusion images are motion degraded, but overall, diagnostic. Post infusion, no abnormal enhancement of the brain or meninges. Vascular: Flow voids are maintained throughout the carotid, basilar, and vertebral arteries. There are no areas of chronic hemorrhage. Skull and upper cervical spine: Unremarkable visualized calvarium, skullbase, and cervical vertebrae. Pituitary, pineal, cerebellar tonsils unremarkable. No upper cervical cord lesions. Sinuses/Orbits: Hypoplastic RIGHT maxillary sinus. No layering fluid. Negative orbits. Other: None. IMPRESSION: Mild atrophy and small vessel disease. No acute  intracranial findings. Specifically, no evidence for intracranial metastatic disease. Electronically Signed   By: JStaci RighterM.D.   On: 01/15/2017 12:14   Ct Femur Left Wo Contrast  Result Date: 01/13/2017 CLINICAL DATA:  Bone lesion noted on recent PET-CT, incompletely imaged. EXAM: CT PELVIS WITHOUT CONTRAST CT OF THE LEFT FEMUR WITHOUT CONTRAST TECHNIQUE: Multidetector CT imaging of the pelvis was performed following the standard protocol without intravenous contrast. COMPARISON:  PET-CT, 01/09/2017.  Left knee radiographs, 05/21/2015. FINDINGS: MUSCULOSKELETAL: Irregular lytic lesion in the left ilium, along the roof of the left acetabulum, measuring 2.3 cm. Lesion breaches the subcortical bone of the superior left acetabulum. No other pelvic lesions. Permeative lesion is seen within the cortex of the mid left femoral shaft, spanning approximately 6 cm in length, and expanding the cortical thickness 2 7 mm. There is associated increased attenuation within the medullary cavity of the mid femur. No associated soft tissue mass. No other  bone lesions. SOFT TISSUES: Within the pelvis, there are no acute abnormalities. Multiple left colon diverticula are noted without diverticulitis. A normal appendix is visualized. Bladder, uterus and adnexa are unremarkable. No adenopathy. No free fluid. Left lower extremity soft tissues show scattered femoral artery vascular calcifications. No masses or adenopathy. Normal attenuation from the 5 musculature. No knee joint effusion. IMPRESSION: 1. The lesion partly imaged on the recent PET-CT is a 6 cm long, permeative, cortically based bone lesion along the posterior mid shaft of the left femur, associated with increased attenuation in the medullary cavity. This is consistent with an additional focus of metastatic disease from breast carcinoma. 2. Metastatic, lytic/destructive lesion of the left ilium, involving the superior acetabulum, with resorption of portions of the  superior acetabular subchondral bone. 3. No other metastatic bone lesions on the included field of view. Electronically Signed   By: Lajean Manes M.D.   On: 01/13/2017 10:41   Nm Pet Image Initial (pi) Skull Base To Thigh  Addendum Date: 01/09/2017   ADDENDUM REPORT: 01/09/2017 16:09 ADDENDUM: The large destructive lumbar vertebral lesion is at the L4 level, as stated in the body of the report. The second impression bullet point lists L3 as the level, but this not correct. The large lumbar lesion is at L4. Electronically Signed   By: Misty Stanley M.D.   On: 01/09/2017 16:09   Result Date: 01/09/2017 CLINICAL DATA:  Initial treatment strategy for breast cancer. EXAM: NUCLEAR MEDICINE PET SKULL BASE TO THIGH TECHNIQUE: Nine point for mCi F-18 FDG was injected intravenously. Full-ring PET imaging was performed from the skull base to thigh after the radiotracer. CT data was obtained and used for attenuation correction and anatomic localization. FASTING BLOOD GLUCOSE:  Value: 81 mg/dl COMPARISON:  None. FINDINGS: NECK: No hypermetabolic lymph nodes in the neck. CHEST: 6 mm Royalty Domagala axis right supraclavicular lymph node and is hypermetabolic with SUV max = 8.5. There is a tiny markedly hypermetabolic right subpectoral lymph node seen on image 40 series for an measuring about 7 mm. Emphysema noted in the lungs bilaterally. Subpleural reticulation anterior left upper lobe suggest prior radiation. 7 mm Colin Norment axis inferior right hilar lymph node (image 56 series 4) is hypermetabolic with SUV max = 3.9. No suspicious pulmonary nodule or mass. ABDOMEN/PELVIS: 2.3 cm right adrenal nodule is markedly hypermetabolic with SUV max = 88.4. Irregular air 3.9 x 3.1 cm left adrenal mass is hypermetabolic with SUV max = 16.6. 10 mm Damyah Gugel axis left periaortic lymph node seen on image 102 of series 4 is hypermetabolic with SUV max = 14 point knee. There is abdominal aortic atherosclerosis without aneurysm. Left colonic diverticulosis  without diverticulitis. SKELETON: Large destructive lesion left aspect of the L4 vertebral body destroys the pedicle and transverse process. Tumor extension into the lateral aspect of the spinal canal at this level is evident. Lesion is markedly hypermetabolic with SUV max = 06.3. A large destructive sternal lesion measuring 4.6 x 9.4 demonstrates SUV max = 23.9. Hypermetabolic metastatic lesion is seen in the posterior left fourth rib. Incompletely visualized hypermetabolic lesion is noted in the distal left femur. Metastatic lesion noted left superior acetabulum. Uptake in the left adductor musculature of the thigh and maybe movement related, but metastatic involvement is not excluded. IMPRESSION: 1. Tiny hypermetabolic lymph nodes in the right supraclavicular region, right subpectoral space, inferior right hilum, and abdominal para-aortic retroperitoneal space. 2. Large hypermetabolic bone lesions in the sternum, L3 vertebral body, left acetabulum, and distal left femur  with other smaller scattered hypermetabolic bone metastases evident. 3. Hypermetabolism in the adductor musculature of the proximal left thigh, possibly movement related but metastatic disease is a concern. 4.  Aortic Atherosclerois (ICD10-170.0) Electronically Signed: By: Misty Stanley M.D. On: 01/09/2017 15:40   Ct Biopsy  Result Date: 01/15/2017 INDICATION: 66 year old female with history of breast cancer. Patient now has multiple destructive bone lesions and needs a tissue diagnosis. EXAM: CT-GUIDED CORE BIOPSIES OF THE L4 LESION MEDICATIONS: None. ANESTHESIA/SEDATION: Moderate (conscious) sedation was employed during this procedure. A total of Versed 3.0 mg and Fentanyl 100 mcg was administered intravenously. Moderate Sedation Time: 22 minutes. The patient's level of consciousness and vital signs were monitored continuously by radiology nursing throughout the procedure under my direct supervision. FLUOROSCOPY TIME:  None COMPLICATIONS:  None immediate. PROCEDURE: Informed written consent was obtained from the patient after a thorough discussion of the procedural risks, benefits and alternatives. All questions were addressed. A timeout was performed prior to the initiation of the procedure. Patient was placed prone. CT images through the lower abdomen were obtained. The lesion involving the left side of the L4 vertebral body was targeted. The patient's back was prepped with chlorhexidine and sterile field was created. Skin and soft tissues were anesthetized with 1% lidocaine. 17 gauge coaxial needle was directed onto the posterior aspect of the L4 left transverse process. Multiple core biopsies were obtained. Three adequate samples were obtained and placed in formalin. 17 gauge needle was removed without complication. FINDINGS: Destructive soft tissue lesion involving the left side of the L4 vertebral body including the left transverse process. Core biopsies were obtained of the L4 left transverse process. IMPRESSION: CT-guided core biopsies of the L4 lesion. Electronically Signed   By: Markus Daft M.D.   On: 01/15/2017 15:55   Dg Outside Films Spine  Result Date: 01/09/2017 This examination belongs to an outside facility and is stored here for comparison purposes only.  Contact the originating outside institution for any associated report or interpretation.  Mr Outside Films Spine  Result Date: 01/09/2017 This examination belongs to an outside facility and is stored here for comparison purposes only.  Contact the originating outside institution for any associated report or interpretation.   Subjective: Swelling is about the same.  Lasix did not change her uop yesterday.  Has worse pain when sitting up in her hips.  Still has not made a decision about possible surgery.   Had some SOB this morning  Discharge Exam: Vitals:   01/19/17 2051 01/20/17 0419  BP: 113/72 124/73  Pulse: 69 65  Resp: 20 18  Temp: 97.9 F (36.6 C) 97.8 F  (36.6 C)  SpO2: 98% 99%   Vitals:   01/19/17 0515 01/19/17 1844 01/19/17 2051 01/20/17 0419  BP: 110/65 106/65 113/72 124/73  Pulse: 64 (!) 57 69 65  Resp: _0 Temp: 97.8 F (36.6 C) (!) 97.4 F (36.3 C) 97.9 F (36.6 C) 97.8 F (36.6 C)  TempSrc: Oral Oral Oral Oral  SpO2: 98% 97% 98% 99%  Weight:      Height:        General: Pt is alert, awake, not in acute distress Cardiovascular: RRR, S1/S2 +, no rubs, no gallops Respiratory: Rales that clear somewhat with repeat respirations at left base.  No wheezes or rhonchi.   Abdominal: Soft, NT, ND, bowel sounds + Extremities: 2+ pitting bilateral lower extremity edema, no cyanosis    The results of significant diagnostics from this hospitalization (including  imaging, microbiology, ancillary and laboratory) are listed below for reference.     Microbiology: Recent Results (from the past 240 hour(s))  Culture, Urine     Status: Abnormal   Collection Time: 01/15/17  9:30 AM  Result Value Ref Range Status   Specimen Description URINE, CLEAN CATCH  Final   Special Requests NONE  Final   Culture >=100,000 COLONIES/mL KLEBSIELLA PNEUMONIAE (A)  Final   Report Status 01/18/2017 FINAL  Final   Organism ID, Bacteria KLEBSIELLA PNEUMONIAE (A)  Final      Susceptibility   Klebsiella pneumoniae - MIC*    AMPICILLIN >=32 RESISTANT Resistant     CEFAZOLIN <=4 SENSITIVE Sensitive     CEFTRIAXONE <=1 SENSITIVE Sensitive     CIPROFLOXACIN <=0.25 SENSITIVE Sensitive     GENTAMICIN <=1 SENSITIVE Sensitive     IMIPENEM <=0.25 SENSITIVE Sensitive     NITROFURANTOIN 32 SENSITIVE Sensitive     TRIMETH/SULFA <=20 SENSITIVE Sensitive     AMPICILLIN/SULBACTAM 4 SENSITIVE Sensitive     PIP/TAZO <=4 SENSITIVE Sensitive     Extended ESBL NEGATIVE Sensitive     * >=100,000 COLONIES/mL KLEBSIELLA PNEUMONIAE     Labs: BNP (last 3 results) No results for input(s): BNP in the last 8760 hours. Basic Metabolic Panel: Recent Labs  Lab  01/16/17 0408 01/17/17 0409 01/18/17 0401 01/19/17 0414 01/20/17 0423  NA 133* 134* 132* 132* 132*  K 4.7 3.7 3.5 3.5 3.4*  CL 102 105 103 102 96*  CO2 22 23 21* 25 27  GLUCOSE 74 76 89 88 89  BUN _0 CREATININE 0.77 0.77 0.74 0.76 0.70  CALCIUM 8.9 9.0 8.6* 8.6* 9.0  MG  --   --  1.1* 1.2* 2.1   Liver Function Tests: Recent Labs  Lab 01/20/17 0423  AST 20  ALT 14  ALKPHOS 75  BILITOT 0.3  PROT 6.4*  ALBUMIN 3.2*   No results for input(s): LIPASE, AMYLASE in the last 168 hours. No results for input(s): AMMONIA in the last 168 hours. CBC: Recent Labs  Lab 01/15/17 0408 01/16/17 0507 01/17/17 0409 01/18/17 0401 01/19/17 0414  WBC 5.1 5.1 4.9 5.2 5.2  HGB 11.5* 11.0* 11.0* 10.7* 10.1*  HCT 33.5* 32.1* 32.5* 31.8* 30.2*  MCV 86.3 87.0 88.3 87.8 88.8  PLT 173 159 136* 146* 141*   Cardiac Enzymes: No results for input(s): CKTOTAL, CKMB, CKMBINDEX, TROPONINI in the last 168 hours. BNP: Invalid input(s): POCBNP CBG: No results for input(s): GLUCAP in the last 168 hours. D-Dimer No results for input(s): DDIMER in the last 72 hours. Hgb A1c No results for input(s): HGBA1C in the last 72 hours. Lipid Profile No results for input(s): CHOL, HDL, LDLCALC, TRIG, CHOLHDL, LDLDIRECT in the last 72 hours. Thyroid function studies No results for input(s): TSH, T4TOTAL, T3FREE, THYROIDAB in the last 72 hours.  Invalid input(s): FREET3 Anemia work up No results for input(s): VITAMINB12, FOLATE, FERRITIN, TIBC, IRON, RETICCTPCT in the last 72 hours. Urinalysis    Component Value Date/Time   COLORURINE YELLOW (A) 01/15/2017 0445   APPEARANCEUR TURBID (A) 01/15/2017 0445   LABSPEC 1.014 01/15/2017 0445   PHURINE 5.0 01/15/2017 0445   GLUCOSEU NEGATIVE 01/15/2017 0445   HGBUR SMALL (A) 01/15/2017 0445   BILIRUBINUR NEGATIVE 01/15/2017 0445   KETONESUR NEGATIVE 01/15/2017 0445   PROTEINUR 30 (A) 01/15/2017 0445   NITRITE POSITIVE (A) 01/15/2017 0445    LEUKOCYTESUR LARGE (A) 01/15/2017 0445   Sepsis Labs Invalid input(s): PROCALCITONIN,  WBC,  LACTICIDVEN   Time coordinating discharge: Over 30 minutes  SIGNED:   Janece Canterbury, MD  [facility] 01/20/2017, 1:24 PM Pager   If 7PM-7AM, please contact night-coverage [email or web address] Password [email or web address]

## 2017-01-20 NOTE — Progress Notes (Signed)
Discharge teaching completed with pt. and Almyra Free daughter in law with teach back. Pt. not able to remember instructions, but Almyra Free will be caregiver and states she understands. Discharge instructions given and reviewed with pt. and family. Prescriptions given for Ativan, Oxycodone, Fentanyl. Almyra Free, caregiver understands to call Monday to schedule doctor appointments.

## 2017-01-20 NOTE — Progress Notes (Signed)
Pt. Discharge to home with caregiver. Left via wheelchair, no respiratory distress noted.

## 2017-01-22 ENCOUNTER — Other Ambulatory Visit: Payer: Self-pay

## 2017-01-22 ENCOUNTER — Encounter (HOSPITAL_COMMUNITY): Payer: Self-pay | Admitting: *Deleted

## 2017-01-23 ENCOUNTER — Inpatient Hospital Stay (HOSPITAL_COMMUNITY): Payer: PPO

## 2017-01-23 ENCOUNTER — Inpatient Hospital Stay (HOSPITAL_COMMUNITY): Payer: PPO | Admitting: Certified Registered"

## 2017-01-23 ENCOUNTER — Inpatient Hospital Stay (HOSPITAL_COMMUNITY)
Admission: RE | Admit: 2017-01-23 | Discharge: 2017-01-27 | DRG: 482 | Disposition: A | Discharge status: Home Health | Payer: PPO | Source: Ambulatory Visit | Attending: Orthopedic Surgery | Admitting: Orthopedic Surgery

## 2017-01-23 ENCOUNTER — Encounter (HOSPITAL_COMMUNITY)
Admission: RE | Disposition: A | Discharge status: Home Health | Payer: Self-pay | Source: Ambulatory Visit | Attending: Orthopedic Surgery

## 2017-01-23 ENCOUNTER — Encounter (HOSPITAL_COMMUNITY): Payer: Self-pay | Admitting: *Deleted

## 2017-01-23 ENCOUNTER — Other Ambulatory Visit: Payer: Self-pay

## 2017-01-23 DIAGNOSIS — R32 Unspecified urinary incontinence: Secondary | ICD-10-CM | POA: Diagnosis not present

## 2017-01-23 DIAGNOSIS — F419 Anxiety disorder, unspecified: Secondary | ICD-10-CM | POA: Diagnosis present

## 2017-01-23 DIAGNOSIS — I10 Essential (primary) hypertension: Secondary | ICD-10-CM | POA: Diagnosis present

## 2017-01-23 DIAGNOSIS — Z9011 Acquired absence of right breast and nipple: Secondary | ICD-10-CM | POA: Diagnosis not present

## 2017-01-23 DIAGNOSIS — C799 Secondary malignant neoplasm of unspecified site: Secondary | ICD-10-CM | POA: Diagnosis not present

## 2017-01-23 DIAGNOSIS — C7951 Secondary malignant neoplasm of bone: Principal | ICD-10-CM

## 2017-01-23 DIAGNOSIS — F329 Major depressive disorder, single episode, unspecified: Secondary | ICD-10-CM | POA: Diagnosis not present

## 2017-01-23 DIAGNOSIS — S72142A Displaced intertrochanteric fracture of left femur, initial encounter for closed fracture: Secondary | ICD-10-CM | POA: Diagnosis not present

## 2017-01-23 DIAGNOSIS — Z8249 Family history of ischemic heart disease and other diseases of the circulatory system: Secondary | ICD-10-CM | POA: Diagnosis not present

## 2017-01-23 DIAGNOSIS — Z9221 Personal history of antineoplastic chemotherapy: Secondary | ICD-10-CM

## 2017-01-23 DIAGNOSIS — Z7982 Long term (current) use of aspirin: Secondary | ICD-10-CM

## 2017-01-23 DIAGNOSIS — I959 Hypotension, unspecified: Secondary | ICD-10-CM | POA: Diagnosis not present

## 2017-01-23 DIAGNOSIS — Z87891 Personal history of nicotine dependence: Secondary | ICD-10-CM

## 2017-01-23 DIAGNOSIS — C50919 Malignant neoplasm of unspecified site of unspecified female breast: Secondary | ICD-10-CM | POA: Diagnosis not present

## 2017-01-23 DIAGNOSIS — Z800 Family history of malignant neoplasm of digestive organs: Secondary | ICD-10-CM | POA: Diagnosis not present

## 2017-01-23 DIAGNOSIS — Z79890 Hormone replacement therapy (postmenopausal): Secondary | ICD-10-CM | POA: Diagnosis not present

## 2017-01-23 DIAGNOSIS — M899 Disorder of bone, unspecified: Secondary | ICD-10-CM

## 2017-01-23 DIAGNOSIS — C50912 Malignant neoplasm of unspecified site of left female breast: Secondary | ICD-10-CM

## 2017-01-23 DIAGNOSIS — E785 Hyperlipidemia, unspecified: Secondary | ICD-10-CM | POA: Diagnosis not present

## 2017-01-23 DIAGNOSIS — Z853 Personal history of malignant neoplasm of breast: Secondary | ICD-10-CM

## 2017-01-23 DIAGNOSIS — E860 Dehydration: Secondary | ICD-10-CM | POA: Diagnosis not present

## 2017-01-23 DIAGNOSIS — R339 Retention of urine, unspecified: Secondary | ICD-10-CM | POA: Diagnosis not present

## 2017-01-23 DIAGNOSIS — M25552 Pain in left hip: Secondary | ICD-10-CM | POA: Diagnosis not present

## 2017-01-23 DIAGNOSIS — Z923 Personal history of irradiation: Secondary | ICD-10-CM | POA: Diagnosis not present

## 2017-01-23 DIAGNOSIS — R42 Dizziness and giddiness: Secondary | ICD-10-CM | POA: Diagnosis not present

## 2017-01-23 DIAGNOSIS — I479 Paroxysmal tachycardia, unspecified: Secondary | ICD-10-CM | POA: Diagnosis not present

## 2017-01-23 DIAGNOSIS — R609 Edema, unspecified: Secondary | ICD-10-CM | POA: Diagnosis not present

## 2017-01-23 DIAGNOSIS — C801 Malignant (primary) neoplasm, unspecified: Secondary | ICD-10-CM | POA: Diagnosis not present

## 2017-01-23 DIAGNOSIS — M549 Dorsalgia, unspecified: Secondary | ICD-10-CM | POA: Diagnosis not present

## 2017-01-23 DIAGNOSIS — M898X6 Other specified disorders of bone, lower leg: Secondary | ICD-10-CM | POA: Diagnosis not present

## 2017-01-23 DIAGNOSIS — K219 Gastro-esophageal reflux disease without esophagitis: Secondary | ICD-10-CM | POA: Diagnosis present

## 2017-01-23 DIAGNOSIS — M84652A Pathological fracture in other disease, left femur, initial encounter for fracture: Secondary | ICD-10-CM | POA: Diagnosis not present

## 2017-01-23 DIAGNOSIS — E039 Hypothyroidism, unspecified: Secondary | ICD-10-CM | POA: Diagnosis not present

## 2017-01-23 DIAGNOSIS — M898X5 Other specified disorders of bone, thigh: Secondary | ICD-10-CM

## 2017-01-23 HISTORY — PX: FEMUR IM NAIL: SHX1597

## 2017-01-23 LAB — BASIC METABOLIC PANEL
ANION GAP: 12 (ref 5–15)
BUN: 8 mg/dL (ref 6–20)
CALCIUM: 9.4 mg/dL (ref 8.9–10.3)
CO2: 29 mmol/L (ref 22–32)
Chloride: 89 mmol/L — ABNORMAL LOW (ref 101–111)
Creatinine, Ser: 0.76 mg/dL (ref 0.44–1.00)
Glucose, Bld: 87 mg/dL (ref 65–99)
Potassium: 3.8 mmol/L (ref 3.5–5.1)
Sodium: 130 mmol/L — ABNORMAL LOW (ref 135–145)

## 2017-01-23 LAB — CBC
HEMATOCRIT: 31.5 % — AB (ref 36.0–46.0)
HEMOGLOBIN: 10.8 g/dL — AB (ref 12.0–15.0)
MCH: 30.4 pg (ref 26.0–34.0)
MCHC: 34.3 g/dL (ref 30.0–36.0)
MCV: 88.7 fL (ref 78.0–100.0)
PLATELETS: 183 10*3/uL (ref 150–400)
RBC: 3.55 MIL/uL — AB (ref 3.87–5.11)
RDW: 15.3 % (ref 11.5–15.5)
WBC: 6 10*3/uL (ref 4.0–10.5)

## 2017-01-23 SURGERY — INSERTION, INTRAMEDULLARY ROD, FEMUR
Anesthesia: General | Laterality: Left

## 2017-01-23 MED ORDER — MEPERIDINE HCL 50 MG/ML IJ SOLN: 6.2500 mg | INTRAMUSCULAR | Status: DC | PRN

## 2017-01-23 MED ORDER — LEVOTHYROXINE SODIUM 75 MCG PO TABS
37.5000 ug | ORAL_TABLET | Freq: Every day | ORAL | Status: DC
  Administered 2017-01-24 – 2017-01-27 (×4): 37.5 ug via ORAL
  Filled 2017-01-23 (×4): qty 1

## 2017-01-23 MED ORDER — ALPRAZOLAM 0.25 MG PO TABS
0.1250 mg | ORAL_TABLET | Freq: Two times a day (BID) | ORAL | Status: DC
  Administered 2017-01-23 – 2017-01-27 (×8): 0.125 mg via ORAL
  Filled 2017-01-23 (×8): qty 1

## 2017-01-23 MED ORDER — HYDROMORPHONE HCL 1 MG/ML IJ SOLN
INTRAMUSCULAR | Status: AC
  Administered 2017-01-23: 0.5 mg via INTRAVENOUS
  Filled 2017-01-23: qty 2

## 2017-01-23 MED ORDER — LACTATED RINGERS IV SOLN
INTRAVENOUS | Status: DC
  Administered 2017-01-23 (×2): via INTRAVENOUS

## 2017-01-23 MED ORDER — SODIUM CHLORIDE 0.9 % IV BOLUS (SEPSIS)
500.0000 mL | Freq: Once | INTRAVENOUS | Status: AC
Start: 2017-01-23 — End: 2017-01-23
  Administered 2017-01-23: 500 mL via INTRAVENOUS

## 2017-01-23 MED ORDER — ONDANSETRON HCL 4 MG/2ML IJ SOLN
INTRAMUSCULAR | Status: AC
  Filled 2017-01-23: qty 2

## 2017-01-23 MED ORDER — ASPIRIN 81 MG PO CHEW
81.0000 mg | CHEWABLE_TABLET | Freq: Every day | ORAL | Status: DC
  Filled 2017-01-23: qty 1

## 2017-01-23 MED ORDER — ONDANSETRON HCL 4 MG/2ML IJ SOLN: 4.0000 mg | Freq: Once | INTRAMUSCULAR | Status: DC | PRN

## 2017-01-23 MED ORDER — CHLORHEXIDINE GLUCONATE 4 % EX LIQD: 60.0000 mL | Freq: Once | CUTANEOUS | Status: DC

## 2017-01-23 MED ORDER — PROPOFOL 10 MG/ML IV BOLUS
INTRAVENOUS | Status: DC | PRN
  Administered 2017-01-23: 160 mg via INTRAVENOUS

## 2017-01-23 MED ORDER — POTASSIUM CHLORIDE CRYS ER 20 MEQ PO TBCR
20.0000 meq | EXTENDED_RELEASE_TABLET | Freq: Every day | ORAL | Status: DC
  Administered 2017-01-24 – 2017-01-27 (×4): 20 meq via ORAL
  Filled 2017-01-23 (×4): qty 1

## 2017-01-23 MED ORDER — PANTOPRAZOLE SODIUM 40 MG PO TBEC
40.0000 mg | DELAYED_RELEASE_TABLET | Freq: Every day | ORAL | Status: DC
  Administered 2017-01-24 – 2017-01-27 (×4): 40 mg via ORAL
  Filled 2017-01-23 (×4): qty 1

## 2017-01-23 MED ORDER — ACETAMINOPHEN 325 MG PO TABS
650.0000 mg | ORAL_TABLET | Freq: Four times a day (QID) | ORAL | Status: DC | PRN
Start: 2017-01-23 — End: 2017-01-27
  Administered 2017-01-26 – 2017-01-27 (×2): 650 mg via ORAL
  Filled 2017-01-23 (×2): qty 2

## 2017-01-23 MED ORDER — HYDROMORPHONE 1 MG/ML IV SOLN: INTRAVENOUS | Status: DC

## 2017-01-23 MED ORDER — HYDROMORPHONE HCL 1 MG/ML IJ SOLN
INTRAMUSCULAR | Status: AC
  Administered 2017-01-23: 0.5 mg via INTRAVENOUS
  Filled 2017-01-23: qty 1

## 2017-01-23 MED ORDER — FENTANYL CITRATE (PF) 100 MCG/2ML IJ SOLN
INTRAMUSCULAR | Status: DC | PRN
  Administered 2017-01-23 (×3): 50 ug via INTRAVENOUS

## 2017-01-23 MED ORDER — FUROSEMIDE 40 MG PO TABS: 40.0000 mg | ORAL_TABLET | Freq: Every day | ORAL | Status: DC

## 2017-01-23 MED ORDER — SUGAMMADEX SODIUM 200 MG/2ML IV SOLN
INTRAVENOUS | Status: AC
Start: 2017-01-23 — End: 2017-01-23
  Filled 2017-01-23: qty 2

## 2017-01-23 MED ORDER — PHENYLEPHRINE 40 MCG/ML (10ML) SYRINGE FOR IV PUSH (FOR BLOOD PRESSURE SUPPORT)
PREFILLED_SYRINGE | INTRAVENOUS | Status: DC | PRN
  Administered 2017-01-23 (×3): 80 ug via INTRAVENOUS
  Administered 2017-01-23: 40 ug via INTRAVENOUS

## 2017-01-23 MED ORDER — FENTANYL CITRATE (PF) 100 MCG/2ML IJ SOLN
INTRAMUSCULAR | Status: AC
  Filled 2017-01-23: qty 2

## 2017-01-23 MED ORDER — KETOROLAC TROMETHAMINE 15 MG/ML IJ SOLN
7.5000 mg | Freq: Four times a day (QID) | INTRAMUSCULAR | Status: AC
  Administered 2017-01-23 – 2017-01-24 (×3): 7.5 mg via INTRAVENOUS
  Filled 2017-01-23 (×4): qty 1

## 2017-01-23 MED ORDER — DIPHENHYDRAMINE HCL 50 MG/ML IJ SOLN: 25.0000 mg | INTRAMUSCULAR | Status: DC | PRN

## 2017-01-23 MED ORDER — DIPHENHYDRAMINE HCL 25 MG PO CAPS: 25.0000 mg | ORAL_CAPSULE | ORAL | Status: DC | PRN

## 2017-01-23 MED ORDER — NALOXONE HCL 0.4 MG/ML IJ SOLN: 0.4000 mg | INTRAMUSCULAR | Status: DC | PRN

## 2017-01-23 MED ORDER — METHOCARBAMOL 500 MG PO TABS
500.0000 mg | ORAL_TABLET | Freq: Four times a day (QID) | ORAL | Status: DC | PRN
  Administered 2017-01-23 – 2017-01-27 (×8): 500 mg via ORAL
  Filled 2017-01-23 (×9): qty 1

## 2017-01-23 MED ORDER — CEFAZOLIN SODIUM-DEXTROSE 2-4 GM/100ML-% IV SOLN
2.0000 g | INTRAVENOUS | Status: AC
  Administered 2017-01-23: 2 g via INTRAVENOUS
  Filled 2017-01-23: qty 100

## 2017-01-23 MED ORDER — ONDANSETRON HCL 4 MG PO TABS: 4.0000 mg | ORAL_TABLET | Freq: Four times a day (QID) | ORAL | Status: DC | PRN

## 2017-01-23 MED ORDER — HYDROMORPHONE HCL 1 MG/ML IJ SOLN
0.2500 mg | INTRAMUSCULAR | Status: DC | PRN
  Administered 2017-01-23 (×2): 0.5 mg via INTRAVENOUS

## 2017-01-23 MED ORDER — DEXTROSE 5 % IV SOLN
500.0000 mg | Freq: Four times a day (QID) | INTRAVENOUS | Status: DC | PRN
  Administered 2017-01-23: 500 mg via INTRAVENOUS
  Filled 2017-01-23: qty 550

## 2017-01-23 MED ORDER — KETOROLAC TROMETHAMINE 30 MG/ML IJ SOLN
INTRAMUSCULAR | Status: AC
  Administered 2017-01-23: 30 mg via INTRAVENOUS
  Filled 2017-01-23: qty 1

## 2017-01-23 MED ORDER — ROCURONIUM BROMIDE 10 MG/ML (PF) SYRINGE
PREFILLED_SYRINGE | INTRAVENOUS | Status: DC | PRN
  Administered 2017-01-23: 30 mg via INTRAVENOUS

## 2017-01-23 MED ORDER — ONDANSETRON HCL 4 MG/2ML IJ SOLN: 4.0000 mg | Freq: Four times a day (QID) | INTRAMUSCULAR | Status: DC | PRN

## 2017-01-23 MED ORDER — HYDROMORPHONE HCL 1 MG/ML IJ SOLN
0.2500 mg | INTRAMUSCULAR | Status: DC | PRN
  Administered 2017-01-23 (×4): 0.5 mg via INTRAVENOUS

## 2017-01-23 MED ORDER — LIDOCAINE 2% (20 MG/ML) 5 ML SYRINGE
INTRAMUSCULAR | Status: DC | PRN
  Administered 2017-01-23: 100 mg via INTRAVENOUS

## 2017-01-23 MED ORDER — KETOROLAC TROMETHAMINE 30 MG/ML IJ SOLN
30.0000 mg | Freq: Once | INTRAMUSCULAR | Status: AC
  Administered 2017-01-23: 30 mg via INTRAVENOUS

## 2017-01-23 MED ORDER — NICOTINE 21 MG/24HR TD PT24
21.0000 mg | MEDICATED_PATCH | Freq: Every day | TRANSDERMAL | Status: DC
  Administered 2017-01-24 – 2017-01-27 (×4): 21 mg via TRANSDERMAL
  Filled 2017-01-23 (×4): qty 1

## 2017-01-23 MED ORDER — LIDOCAINE 2% (20 MG/ML) 5 ML SYRINGE
INTRAMUSCULAR | Status: AC
Start: 2017-01-23 — End: 2017-01-23
  Filled 2017-01-23: qty 5

## 2017-01-23 MED ORDER — HYDROMORPHONE HCL 1 MG/ML IJ SOLN
1.0000 mg | INTRAMUSCULAR | Status: DC | PRN
  Filled 2017-01-23 (×2): qty 1

## 2017-01-23 MED ORDER — ENOXAPARIN SODIUM 40 MG/0.4ML ~~LOC~~ SOLN
40.0000 mg | SUBCUTANEOUS | Status: DC
  Administered 2017-01-24 – 2017-01-27 (×4): 40 mg via SUBCUTANEOUS
  Filled 2017-01-23 (×4): qty 0.4

## 2017-01-23 MED ORDER — EPHEDRINE SULFATE-NACL 50-0.9 MG/10ML-% IV SOSY
PREFILLED_SYRINGE | INTRAVENOUS | Status: DC | PRN
  Administered 2017-01-23 (×2): 5 mg via INTRAVENOUS
  Administered 2017-01-23: 10 mg via INTRAVENOUS

## 2017-01-23 MED ORDER — MIDAZOLAM HCL 5 MG/5ML IJ SOLN
INTRAMUSCULAR | Status: DC | PRN
  Administered 2017-01-23: 2 mg via INTRAVENOUS

## 2017-01-23 MED ORDER — PAROXETINE HCL 20 MG PO TABS
20.0000 mg | ORAL_TABLET | Freq: Every day | ORAL | Status: DC
  Administered 2017-01-24 – 2017-01-27 (×4): 20 mg via ORAL
  Filled 2017-01-23 (×4): qty 1

## 2017-01-23 MED ORDER — ONDANSETRON HCL 4 MG/2ML IJ SOLN
INTRAMUSCULAR | Status: DC | PRN
  Administered 2017-01-23: 4 mg via INTRAVENOUS

## 2017-01-23 MED ORDER — EPHEDRINE 5 MG/ML INJ
INTRAVENOUS | Status: AC
  Filled 2017-01-23: qty 10

## 2017-01-23 MED ORDER — DOCUSATE SODIUM 100 MG PO CAPS
200.0000 mg | ORAL_CAPSULE | Freq: Every day | ORAL | Status: DC
  Administered 2017-01-23 – 2017-01-26 (×4): 200 mg via ORAL
  Filled 2017-01-23 (×4): qty 2

## 2017-01-23 MED ORDER — OXYCODONE HCL 5 MG PO TABS
5.0000 mg | ORAL_TABLET | ORAL | Status: DC | PRN
Start: 2017-01-23 — End: 2017-01-25
  Administered 2017-01-23 (×2): 5 mg via ORAL
  Administered 2017-01-23 – 2017-01-24 (×2): 10 mg via ORAL
  Administered 2017-01-24: 5 mg via ORAL
  Administered 2017-01-24 (×2): 10 mg via ORAL
  Filled 2017-01-23: qty 1
  Filled 2017-01-23 (×4): qty 2
  Filled 2017-01-23: qty 1
  Filled 2017-01-23: qty 2

## 2017-01-23 MED ORDER — ACETAMINOPHEN 650 MG RE SUPP: 650.0000 mg | Freq: Four times a day (QID) | RECTAL | Status: DC | PRN

## 2017-01-23 MED ORDER — MIDAZOLAM HCL 2 MG/2ML IJ SOLN
INTRAMUSCULAR | Status: AC
  Filled 2017-01-23: qty 2

## 2017-01-23 MED ORDER — ENSURE ENLIVE PO LIQD
237.0000 mL | Freq: Two times a day (BID) | ORAL | Status: DC
  Administered 2017-01-24 – 2017-01-26 (×4): 237 mL via ORAL

## 2017-01-23 MED ORDER — SODIUM CHLORIDE 0.9% FLUSH: 9.0000 mL | INTRAVENOUS | Status: DC | PRN

## 2017-01-23 MED ORDER — 0.9 % SODIUM CHLORIDE (POUR BTL) OPTIME
TOPICAL | Status: DC | PRN
  Administered 2017-01-23: 1000 mL

## 2017-01-23 MED ORDER — SUGAMMADEX SODIUM 200 MG/2ML IV SOLN
INTRAVENOUS | Status: DC | PRN
  Administered 2017-01-23: 175 mg via INTRAVENOUS

## 2017-01-23 MED ORDER — FENTANYL 50 MCG/HR TD PT72
50.0000 ug | MEDICATED_PATCH | TRANSDERMAL | Status: DC
  Administered 2017-01-23 – 2017-01-26 (×2): 50 ug via TRANSDERMAL
  Filled 2017-01-23 (×2): qty 1

## 2017-01-23 MED ORDER — PHENYLEPHRINE 40 MCG/ML (10ML) SYRINGE FOR IV PUSH (FOR BLOOD PRESSURE SUPPORT)
PREFILLED_SYRINGE | INTRAVENOUS | Status: AC
Start: 2017-01-23 — End: 2017-01-23
  Filled 2017-01-23: qty 10

## 2017-01-23 SURGICAL SUPPLY — 32 items
BAG ZIPLOCK 12X15 (MISCELLANEOUS) IMPLANT
BIT DRILL 4.2 (DRILL) ×2 IMPLANT
COVER PERINEAL POST (MISCELLANEOUS) ×3 IMPLANT
COVER SURGICAL LIGHT HANDLE (MISCELLANEOUS) ×3 IMPLANT
DRAPE C-ARM 42X120 X-RAY (DRAPES) ×3 IMPLANT
DRAPE INCISE IOBAN 66X45 STRL (DRAPES) ×3 IMPLANT
DRAPE ORTHO SPLIT 77X108 STRL (DRAPES) ×4
DRAPE STERI IOBAN 125X83 (DRAPES) ×3 IMPLANT
DRAPE SURG 17X11 SM STRL (DRAPES) IMPLANT
DRAPE SURG ORHT 6 SPLT 77X108 (DRAPES) ×2 IMPLANT
DRILL 4.2 (DRILL) ×6
DURAPREP 26ML APPLICATOR (WOUND CARE) ×3 IMPLANT
ELECT REM PT RETURN 15FT ADLT (MISCELLANEOUS) ×3 IMPLANT
GAUZE SPONGE 4X4 12PLY STRL (GAUZE/BANDAGES/DRESSINGS) ×12 IMPLANT
GAUZE XEROFORM 1X8 LF (GAUZE/BANDAGES/DRESSINGS) ×12 IMPLANT
GLOVE BIO SURGEON STRL SZ7.5 (GLOVE) ×3 IMPLANT
GLOVE INDICATOR 8.0 STRL GRN (GLOVE) ×3 IMPLANT
GOWN STRL REUS W/TWL LRG LVL3 (GOWN DISPOSABLE) ×3 IMPLANT
GUIDEWIRE 3.2X400 (WIRE) ×6 IMPLANT
KIT BASIN OR (CUSTOM PROCEDURE TRAY) ×3 IMPLANT
MANIFOLD NEPTUNE II (INSTRUMENTS) ×3 IMPLANT
NAIL TI CANN TFNA 10MM-130DEG (Nail) ×3 IMPLANT
PACK GENERAL/GYN (CUSTOM PROCEDURE TRAY) ×3 IMPLANT
POSITIONER SURGICAL ARM (MISCELLANEOUS) IMPLANT
REAMER ROD DEEP FLUTE 2.5X950 (INSTRUMENTS) ×3 IMPLANT
SCREW CANN LOCK TI FT 5X42 (Screw) ×3 IMPLANT
SCREW LOCKING 5.0MMX40MM (Screw) ×3 IMPLANT
SCREW TFNA 90MM HIP (Orthopedic Implant) ×3 IMPLANT
STAPLER VISISTAT 35W (STAPLE) ×3 IMPLANT
SUT VIC AB 2-0 CT1 27 (SUTURE) ×2
SUT VIC AB 2-0 CT1 TAPERPNT 27 (SUTURE) ×1 IMPLANT
TOWEL OR 17X26 10 PK STRL BLUE (TOWEL DISPOSABLE) ×3 IMPLANT

## 2017-01-23 NOTE — Brief Op Note (Signed)
01/23/2017  2:38 PM  PATIENT:  Theresa Levine  66 y.o. female  PRE-OPERATIVE DIAGNOSIS:  Left femur bone lesion, impending fracture  POST-OPERATIVE DIAGNOSIS:  Left femur bone lesion, impending fracture  PROCEDURE:  Procedure(s) with comments: INTRAMEDULLARY (IM) NAIL FEMORAL (Left) - 120 mins  SURGEON:  Surgeon(s) and Role:    * Stann Mainland, Elly Modena, MD - Primary  PHYSICIAN ASSISTANT:   ASSISTANTS: none   ANESTHESIA:   general  EBL:  150 mL   BLOOD ADMINISTERED:none  DRAINS: none   LOCAL MEDICATIONS USED:  NONE  SPECIMEN:  No Specimen  DISPOSITION OF SPECIMEN:  N/A  COUNTS:  YES  TOURNIQUET:  * No tourniquets in log *  DICTATION: .Note written in EPIC  PLAN OF CARE: Admit to inpatient   PATIENT DISPOSITION:  PACU - hemodynamically stable.   Delay start of Pharmacological VTE agent (>24hrs) due to surgical blood loss or risk of bleeding: not applicable

## 2017-01-23 NOTE — Op Note (Signed)
Date of Surgery: 01/23/2017  INDICATIONS: Theresa Levine is a 66 y.o.-year-old female who unfortunately has developed multiple metastatic bone lesions secondary to breast carcinoma.  On Merrill's criteria she was noted to be indicated for prophylactic intramedullary nailing of the left femur impending fracture.  We discussed our recommendation to do this procedure to allow continuation of full weightbearing as tolerated, and also to allow radiation therapy if indicated.  The risks and benefits of the procedure discussed with the patient prior to the procedure and all questions were answered; consent was obtained.  PREOPERATIVE DIAGNOSIS:  left impending pathologic fracture femur  POSTOPERATIVE DIAGNOSIS: Same  PROCEDURE:  left femur intramedullary nailing.    SURGEON: Geralynn Rile, M.D.  ASSISTANT: None   ANESTHESIA:  general  IV FLUIDS AND URINE: See anesthesia record.  ESTIMATED BLOOD LOSS: 150 mL.  IMPLANTS:  Synthes TFN A 10 mm x 400 mm And 90 mm compression screw 5 mm x 42 mm distal interlock screw 5 mm x 40 mm distal interlock screw   DRAINS: None.  COMPLICATIONS: None.  DESCRIPTION OF PROCEDURE: The patient was brought to the operating room and placed supine on the operating table.  The patient's leg had been signed prior to the procedure and this was documented.  The patient had the anesthesia placed by the anesthesiologist.  The prep verification and incision time-outs were performed to confirm that this was the correct patient, site, side and location. The patient had an SCD on the opposite lower extremity. The patient did receive antibiotics prior to the incision and was re-dosed during the procedure as needed at indicated intervals.  The patient had the lower extremity prepped and draped in the standard surgical fashion.    Incision was made approximately 4 fingerbreadths proximal to the greater trochanter tip.  Dissection was carried down through skin and  subcutaneous tissue and gluteal fascia.  The gluteus muscles were split in line with their fibers.  At the hip, the starting point was first found with the guide wire and this was inserted under fluoroscopic visualization. The guide wire was placed partially down into the femur.  All radiographs were confirmed throughout the procedure on both AP and lateral views. The ball-tipped guide wire was then placed down to the distal portion of the femur in the proper location and the measuring guide was used to measure off of this after the femur was brought out to length. Sequential reaming was then performed to give some chatter.  Chatter was first heard at the 11 mm reamer.  We then reamed up to an 11.5 reamer, and then a 12.0 mm reamer to receive a 10 mm diameter nail.  The nail itself was then inserted over the wire and then the guide wire was removed. The proximal interlocking screws were placed first starting with the inferior one to allow for proper placement at the calcar.  Again, all radiographs were confirmed on both AP and lateral views.  The proximal compression screw was placed through the jig in the standard fashion, first incising the skin, subcutaneous tissue, and fascia, then spreading with a clamp.  The drill was placed and confirmed fluoroscopically, followed by measuring, then placing the screws by hand.  Attention was then turned to the distal interlocking screws.  A 90 mm compression screw was found to be adequate.  The lateral x-ray was used to get the perfect circles view, then an incision was made through the skin and subcutaneous tissue and fascia followed by drilling,  measuring with a depth gauge, then placing the screws by hand. Final x-rays were taken on both AP and lateral views to confirm all of the screw placements.  An internal rotation view was taken at the femoral neck to confirm integrity of the neck.  The wounds were copiously irrigated with saline and then the deep fascia was closed  with 0 Vicryl figure-of-eight interrupted sutures. The skin was re-approximated with staples. The wounds were cleaned and dried a final time and a sterile dressing was placed. The patient was then transferred to a bed and left the operating room in stable condition.  All counts were correct at the end of the case.  There were no immediate complications, and the patient was transferred to PACU in stable condition.  POSTOPERATIVE PLAN: Theresa Levine will be WBAT and will return in 2 weeks for suture removal;  she will not need any x-rays at that time.  Theresa Levine will receive DVT prophylaxis based on other medications, activity level, and risk ratio of bleeding to thrombosis.  Due to her history of cancer and current active recurrent cancer we will place her on Lovenox for DVT prophylaxis.  She will be admitted to my service for postoperative physical therapy and pain control.

## 2017-01-23 NOTE — Consult Note (Signed)
Consult Note   [name] [facility] TKZ:[id number] DOB: [birth year] DOA: 01/23/2017  PCP: Aletha Halim., PA-C   Requesting physician: Dr. Stann Mainland  Reason for consultation: Follow up of edematous state and further medical management during hospitalization.   HPI: Theresa Levine is a 66 y.o. female with medical history significant of breast cancer with multiple metastatic bone lesions. She had recent hospitalization November 9, due to right hip pain, she was found to have metastatic lesion in the left femur with impending fracture. Today she underwent an elective prophylactic intramedullary nailing of left femur impending fracture.   During her last hospitalization she was noted to be edematous,  and received IV loop diuretics, she was advised to use TED hose and continue furosemide at home.   Patient is feeling well post orthopedic procedure, lower extremity edema has improved, denies any leg pain, dyspnea, palpitations. The edema was present at home, mild to moderate intensity, worse at the end of the day, no improving factors, no associated dyspnea.    Review of Systems:  1. General: No fevers, no chills, no weight gain or weight loss 2. ENT: No runny nose or sore throat, no hearing disturbances 3. Pulmonary: No dyspnea, cough, wheezing, or hemoptysis 4. Cardiovascular: No angina, claudication, positive lower extremity edema, no pnd or orthopnea 5. Gastrointestinal: No nausea or vomiting, no diarrhea or constipation 6. Hematology: No easy bruisability or frequent infections 7. Urology: No dysuria, hematuria or increased urinary frequency 8. Dermatology: No rashes. 9. Neurology: No seizures or paresthesias 10. Musculoskeletal: left leg pain.   Past Medical History:  Diagnosis Date  . Anxiety   . Breast cancer ([place])   . Depression   . FHx: chemotherapy 1998   Radiation left in chest in 2008  . Gastroesophageal reflux disease   . History of percutaneous left heart  catheterization September 2009   This showed only luminal irregularities, EF was 60%   . History of stress test    ETT 6/17: Poor Ex capacity, Ex 3' (4.6 METs), no ischemic ST changes, hypertensive BP response // ETT-Echo 8/17: normal  . Hyperlipidemia    unable to tolerate Crestor, Lipitor, or pravastatin  . Hypertension   . Hypothyroidism   . Mild mitral regurgitation by prior echocardiogram September 2009   Showed an EF of 55-60%  . Palpitations   . Supraventricular tachycardia ([place])    The patient had an episode of SVT in September 2009. Her heart rate was reported  to be around 240 by EMS., However, there were no strips to evaluate. It did terminate with  adenosine. 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 The patient did have an event monitor done in October 2009 that showed only normal sinus rhythm.   . Tobacco user     Past Surgical History:  Procedure Laterality Date  . CARDIAC CATHETERIZATION  12/02/2006   Eustace Quail, MD. Lumpectomy and radical nide dissection of the left breast  . MASTECTOMY  1997     reports that she quit smoking 11 days ago. Her smoking use included cigarettes. She has a 40.00 pack-year smoking history. she has never used smokeless tobacco. She reports that she drinks alcohol. She reports that she does not use drugs.  Allergies  Allergen Reactions  . Crestor [Rosuvastatin Calcium] Other (See Comments)    Myalgias  . Ezetimibe     GI Upset (intolerance)  . Lorazepam     Loopy, hallucinations, memory loss   . Sulfa Antibiotics     unknown  Family History  Problem Relation Age of Onset  . Heart attack Father 73  . Esophageal cancer Brother   . Fainting Sister        Had a pacemaker put in  . Stroke Paternal Grandfather      Prior to Admission medications   Medication Sig Start Date End Date Taking? Authorizing Provider  ALPRAZolam (XANAX) 0.25 MG tablet Take 0.125 mg 2 (two) times daily by mouth.   Yes [provider]  aspirin 81 MG  tablet Take 81 mg by mouth daily.   Yes [provider]  docusate sodium (COLACE) 100 MG capsule Take 200 mg at bedtime by mouth.   Yes [provider]  esomeprazole (NEXIUM) 20 MG capsule Take 40 mg daily at 12 noon by mouth.   Yes [provider]  feeding supplement, ENSURE ENLIVE, (ENSURE ENLIVE) LIQD Take 237 mLs 2 (two) times daily between meals by mouth. Patient taking differently: Take 237 mLs daily by mouth.  01/20/17  Yes Short, Noah Delaine, MD  fentaNYL (DURAGESIC - DOSED MCG/HR) 50 MCG/HR Place 1 patch (50 mcg total) every 3 (three) days onto the skin. 01/20/17  Yes Short, Noah Delaine, MD  furosemide (LASIX) 40 MG tablet Take 1 tablet (40 mg total) daily for 7 days by mouth. 01/20/17 01/27/17 Yes Short, Noah Delaine, MD  levothyroxine (SYNTHROID, LEVOTHROID) 75 MCG tablet Take 0.5 tablets (37.5 mcg total) daily before breakfast by mouth. 01/21/17  Yes Short, Noah Delaine, MD  nicotine (NICODERM CQ - DOSED IN MG/24 HOURS) 21 mg/24hr patch Place 1 patch (21 mg total) daily onto the skin. 01/21/17  Yes Short, Noah Delaine, MD  oxyCODONE (OXY IR/ROXICODONE) 5 MG immediate release tablet Take 1 tablet (5 mg total) every 4 (four) hours as needed by mouth for moderate pain. 01/20/17  Yes Short, Noah Delaine, MD  PARoxetine (PAXIL) 20 MG tablet Take 1 tablet (20 mg total) daily by mouth. 01/21/17  Yes Short, Noah Delaine, MD  potassium chloride SA (K-DUR,KLOR-CON) 20 MEQ tablet Take 1 tablet (20 mEq total) daily by mouth. 01/21/17  Yes Short, Noah Delaine, MD  LORazepam (ATIVAN) 0.5 MG tablet Take 1 tablet (0.5 mg total) 2 (two) times daily by mouth. Patient not taking: Reported on 01/22/2017 01/20/17   Janece Canterbury, MD    Physical Exam: Vitals:   01/23/17 1212 01/23/17 1434 01/23/17 1445 01/23/17 1500  BP:  111/90 117/86   Pulse:  (!) 128 (!) 121 (!) 115  Resp:  (!) 24 13 18   Temp:  (!) 97.3 F (36.3 C)    TempSrc:      SpO2:  100% 100% 100%  Weight: 79.4 kg (175 lb)       Height: 5\' 6"  (1.676 m)       Constitutional: not in pain or  dyspnea Vitals:   01/23/17 1212 01/23/17 1434 01/23/17 1445 01/23/17 1500  BP:  111/90 117/86   Pulse:  (!) 128 (!) 121 (!) 115  Resp:  (!) 24 13 18   Temp:  (!) 97.3 F (36.3 C)    TempSrc:      SpO2:  100% 100% 100%  Weight: 79.4 kg (175 lb)     Height: 5\' 6"  (1.676 m)      Eyes: PERRL, lids and conjunctivae normal Head normocephalic, nose and ears with no deformities.  ENMT: Mucous membranes are moist. Posterior pharynx clear of any exudate or lesions.Normal dentition.  Neck: normal, supple, no masses, no thyromegaly Respiratory: clear to auscultation bilaterally, no wheezing, no crackles. Normal respiratory effort. No  accessory muscle use.  Cardiovascular: Regular rate and rhythm, no murmurs / rubs / gallops. Trace lower extremity edema. 2+ pedal pulses. No carotid bruits.  Abdomen: no tenderness, no masses palpated. No hepatosplenomegaly. Bowel sounds positive.  Musculoskeletal: no clubbing / cyanosis. No joint deformity upper and lower extremities. Good ROM, no contractures. Normal muscle tone.  Skin: no rashes, lesions, ulcers. No induration Neurologic: CN 2-12 grossly intact. Sensation intact, DTR normal. Strength 5/5 in all 4.  .    Labs on Admission: I have personally reviewed following labs and imaging studies  CBC: Recent Labs  Lab 01/17/17 0409 01/18/17 0401 01/19/17 0414  WBC 4.9 5.2 5.2  HGB 11.0* 10.7* 10.1*  HCT 32.5* 31.8* 30.2*  MCV 88.3 87.8 88.8  PLT 136* 146* 573*   Basic Metabolic Panel: Recent Labs  Lab 01/17/17 0409 01/18/17 0401 01/19/17 0414 01/20/17 0423 01/23/17 1033  NA 134* 132* 132* 132* 130*  K 3.7 3.5 3.5 3.4* 3.8  CL 105 103 102 96* 89*  CO2 23 21* 25 27 29   GLUCOSE 76 89 88 89 87  BUN 9 9 8 6 8   CREATININE 0.77 0.74 0.76 0.70 0.76  CALCIUM 9.0 8.6* 8.6* 9.0 9.4  MG  --  1.1* 1.2* 2.1  --    GFR: Estimated Creatinine Clearance: 71.5 mL/min (by C-G formula  based on SCr of 0.76 mg/dL). Liver Function Tests: Recent Labs  Lab 01/20/17 0423  AST 20  ALT 14  ALKPHOS 75  BILITOT 0.3  PROT 6.4*  ALBUMIN 3.2*   No results for input(s): LIPASE, AMYLASE in the last 168 hours. No results for input(s): AMMONIA in the last 168 hours. Coagulation Profile: No results for input(s): INR, PROTIME in the last 168 hours. Cardiac Enzymes: No results for input(s): CKTOTAL, CKMB, CKMBINDEX, TROPONINI in the last 168 hours. BNP (last 3 results) No results for input(s): PROBNP in the last 8760 hours. HbA1C: No results for input(s): HGBA1C in the last 72 hours. CBG: No results for input(s): GLUCAP in the last 168 hours. Lipid Profile: No results for input(s): CHOL, HDL, LDLCALC, TRIG, CHOLHDL, LDLDIRECT in the last 72 hours. Thyroid Function Tests: No results for input(s): TSH, T4TOTAL, FREET4, T3FREE, THYROIDAB in the last 72 hours. Anemia Panel: No results for input(s): VITAMINB12, FOLATE, FERRITIN, TIBC, IRON, RETICCTPCT in the last 72 hours. Urine analysis:    Component Value Date/Time   COLORURINE YELLOW (A) 01/15/2017 0445   APPEARANCEUR TURBID (A) 01/15/2017 0445   LABSPEC 1.014 01/15/2017 0445   PHURINE 5.0 01/15/2017 0445   GLUCOSEU NEGATIVE 01/15/2017 0445   HGBUR SMALL (A) 01/15/2017 0445   BILIRUBINUR NEGATIVE 01/15/2017 [place] 01/15/2017 0445   PROTEINUR 30 (A) 01/15/2017 0445   NITRITE POSITIVE (A) 01/15/2017 0445   LEUKOCYTESUR LARGE (A) 01/15/2017 0445    Radiological Exams on Admission: Dg C-arm 1-60 Min-no Report  Result Date: 01/23/2017 Fluoroscopy was utilized by the requesting physician.  No radiographic interpretation.    EKG: Independently reviewed. NA  Assessment/Plan Principal Problem:   Breast cancer metastasized to bone/Bone metastases/Bony metastasis/H/o Breast Cancer Active Problems:   Lytic bone lesion of left femur  66 year old female with metastatic breast cancer who has been  intervened with a left femur intramedullary nailing with no major complications. Her lower extremity edema has improved, denies any heart failure symptoms. On physical examination temperature 97.4, blood pressure 94/62, heart rate 106, respiratory 22, oxygen saturation 100% on 2 L nasal cannula. Mild pallor, lungs clear to  auscultation, heart S1-S2 present rhythmic, abdomen soft nontender, trace lower extremity edema. Sodium 1:30, potassium 3.8, chloride 89, bicarbonate 29, glucose 87, BUN 8, creatinine 0.76, white count 6.0, Humulin 10.8, hematocrit 31.5, platelets 183.   Working diagnosis metastatic breast cancer status post left femur intramedullary nailing.   1. Peripheral lower extremity edema. Had extensive workup on prior hospitalization, clinically seems to be improved, will continue compression devices, elevation of the extremities as tolerated. Hold on furosemide for now, in the setting of hypotension.  2. Hypotension. We will continue close monitoring, keep map more than 65, with hold on diuretics  3. Breast cancer, with bony metastasis. Follow-up as an outpatient, continue pain control, will need physical therapy evaluation.  4. Hypothyroidism. Continue levothyroxine.  5. Depression. On paroxetine  DVT prophylaxis: enoxparin Code Status: full  Family Communication: I spoke with patient's daughter at the bedside and all questions were addressed.  Disposition Plan: home/ snf     Mauricio Gerome Apley MD [facility] Pager [phone]  If 7PM-7AM, please contact night-coverage [email or web address] Password [email or web address]  01/23/2017, 3:01 PM

## 2017-01-23 NOTE — Progress Notes (Signed)
Pts BP 79/54 with machine and 86/44 manually. Notified on call provider for [place]. Received a call back from [facility]. New orders to give 500 ml 0.9% NS bolus and recheck BP after.

## 2017-01-23 NOTE — Anesthesia Postprocedure Evaluation (Signed)
Anesthesia Post Note  Patient: Theresa Levine [facility]  Procedure(s) Performed: INTRAMEDULLARY (IM) NAIL FEMORAL (Left )     Patient location during evaluation: PACU Anesthesia Type: General Level of consciousness: sedated and patient cooperative Pain management: pain level controlled Vital Signs Assessment: post-procedure vital signs reviewed and stable Respiratory status: spontaneous breathing Cardiovascular status: stable Anesthetic complications: no    Last Vitals:  Vitals:   01/23/17 1600 01/23/17 1700  BP: 93/70 (!) 84/55  Pulse: (!) 111 (!) 105  Resp: 18 20  Temp: 36.4 C 36.4 C  SpO2: 97% 96%    Last Pain:  Vitals:   01/23/17 1714  TempSrc:   PainSc: 3                  Nolon Nations

## 2017-01-23 NOTE — H&P (Signed)
ORTHOPAEDIC CONSULTATION  REQUESTING PHYSICIAN: Nicholes Stairs, MD  PCP:  Aletha Halim., PA-C  Chief Complaint: Impending left femoral shaft fracture.  HPI: Theresa Levine is a 66 y.o. female who complains of increasing left leg and thigh pain with weightbearing.  She has recently been discharged from a hospital admission for metastatic carcinoma.  Biopsy confirmed that this was metastatic breast carcinoma.  She has lesions in the left femur which are indicated for prophylactic stabilization.  She was discharged home over the weekend per her request to consider her options.  She has elected to move forward today with surgical stabilization of the left femur.  She endorses increasing pain over the last couple of days but overall she feels at her baseline.  She does continue to complain of some lower extremity edema that was being managed at her last hospital admission.  Otherwise she denies fevers, chills, shortness of breath, or chest pain.  She is interested in moving forward with surgery today so that she can get back to her family at home as soon as possible.  Past Medical History:  Diagnosis Date  . Anxiety   . Breast cancer ([place])   . Depression   . FHx: chemotherapy 1998   Radiation left in chest in 2008  . Gastroesophageal reflux disease   . History of percutaneous left heart catheterization September 2009   This showed only luminal irregularities, EF was 60%   . History of stress test    ETT 6/17: Poor Ex capacity, Ex 3' (4.6 METs), no ischemic ST changes, hypertensive BP response // ETT-Echo 8/17: normal  . Hyperlipidemia    unable to tolerate Crestor, Lipitor, or pravastatin  . Hypertension   . Hypothyroidism   . Mild mitral regurgitation by prior echocardiogram September 2009   Showed an EF of 55-60%  . Palpitations   . Supraventricular tachycardia ([place])    The patient had an episode of SVT in September 2009. Her heart rate was reported  to be around 240  by EMS., However, there were no strips to evaluate. It did terminate with  adenosine. 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 The patient did have an event monitor done in October 2009 that showed only normal sinus rhythm.   . Tobacco user    Past Surgical History:  Procedure Laterality Date  . CARDIAC CATHETERIZATION  12/02/2006   Eustace Quail, MD. Lumpectomy and radical nide dissection of the left breast  . MASTECTOMY  1997   Social History   Socioeconomic History  . Marital status: Divorced    Spouse name: None  . Number of children: None  . Years of education: None  . Highest education level: None  Social Needs  . Financial resource strain: None  . Food insecurity - worry: None  . Food insecurity - inability: None  . Transportation needs - medical: None  . Transportation needs - non-medical: None  Occupational History  . Occupation: Occupational hygienist  Tobacco Use  . Smoking status: Former Smoker    Packs/day: 1.00    Years: 40.00    Pack years: 40.00    Types: Cigarettes    Last attempt to quit: 01/12/2017    Years since quitting: 0.0  . Smokeless tobacco: Never Used  Substance and Sexual Activity  . Alcohol use: Yes    Alcohol/week: 0.0 oz    Comment: Minimal  . Drug use: No  . Sexual activity: None  Other Topics Concern  . None  Social  History Narrative  . None   Family History  Problem Relation Age of Onset  . Heart attack Father 26  . Esophageal cancer Brother   . Fainting Sister        Had a pacemaker put in  . Stroke Paternal Grandfather    Allergies  Allergen Reactions  . Crestor [Rosuvastatin Calcium] Other (See Comments)    Myalgias  . Ezetimibe     GI Upset (intolerance)  . Lorazepam     Loopy, hallucinations, memory loss   . Sulfa Antibiotics     unknown   Prior to Admission medications   Medication Sig Start Date End Date Taking? Authorizing Provider  ALPRAZolam (XANAX) 0.25 MG tablet Take 0.125 mg 2 (two) times daily by mouth.   Yes [provider]  aspirin 81 MG tablet Take 81 mg by mouth daily.   Yes [provider]  docusate sodium (COLACE) 100 MG capsule Take 200 mg at bedtime by mouth.   Yes [provider]  esomeprazole (NEXIUM) 20 MG capsule Take 40 mg daily at 12 noon by mouth.   Yes [provider]  feeding supplement, ENSURE ENLIVE, (ENSURE ENLIVE) LIQD Take 237 mLs 2 (two) times daily between meals by mouth. Patient taking differently: Take 237 mLs daily by mouth.  01/20/17  Yes Short, Noah Delaine, MD  fentaNYL (DURAGESIC - DOSED MCG/HR) 50 MCG/HR Place 1 patch (50 mcg total) every 3 (three) days onto the skin. 01/20/17  Yes Short, Noah Delaine, MD  furosemide (LASIX) 40 MG tablet Take 1 tablet (40 mg total) daily for 7 days by mouth. 01/20/17 01/27/17 Yes Short, Noah Delaine, MD  levothyroxine (SYNTHROID, LEVOTHROID) 75 MCG tablet Take 0.5 tablets (37.5 mcg total) daily before breakfast by mouth. 01/21/17  Yes Short, Noah Delaine, MD  nicotine (NICODERM CQ - DOSED IN MG/24 HOURS) 21 mg/24hr patch Place 1 patch (21 mg total) daily onto the skin. 01/21/17  Yes Short, Noah Delaine, MD  oxyCODONE (OXY IR/ROXICODONE) 5 MG immediate release tablet Take 1 tablet (5 mg total) every 4 (four) hours as needed by mouth for moderate pain. 01/20/17  Yes Short, Noah Delaine, MD  PARoxetine (PAXIL) 20 MG tablet Take 1 tablet (20 mg total) daily by mouth. 01/21/17  Yes Short, Noah Delaine, MD  potassium chloride SA (K-DUR,KLOR-CON) 20 MEQ tablet Take 1 tablet (20 mEq total) daily by mouth. 01/21/17  Yes Short, Noah Delaine, MD  LORazepam (ATIVAN) 0.5 MG tablet Take 1 tablet (0.5 mg total) 2 (two) times daily by mouth. Patient not taking: Reported on 01/22/2017 01/20/17   Janece Canterbury, MD   No results found.  Positive ROS: All other systems have been reviewed and were otherwise negative with the exception of those mentioned in the HPI and as above.  Physical Exam: General: Alert, no acute distress Cardiovascular: 1+ edema  bilateral lower extremities to the tibial tubercle. Respiratory: No cyanosis, no use of accessory musculature GI: No organomegaly, abdomen is soft and non-tender Skin: No lesions in the area of chief complaint Neurologic: Sensation intact distally Psychiatric: Patient is competent for consent with normal mood and affect Lymphatic: No axillary or cervical lymphadenopathy  MUSCULOSKELETAL:  Left lower extremity she is weak with hip flexion and knee extension as well as knee flexion.  At the ankle she has good strength with dorsiflexion and plantar flexion.  Otherwise she has some pain on palpation of the mid thigh.  No pain at the knee with passive motion.  At the ankle she endorses sensation intact to light touch throughout  the deep and superficial peroneal nerves as well as the sural, saphenous and tibial nerves.  2+ dorsalis pedis pulse.  Assessment: 1.  Metastatic breast carcinoma 2.  Impending left femur fracture Plan: -Our recommendation is to move forward with prophylactic intramedullary nail fixation of the left femur.  Based on criteria she is indicated for this stabilization procedure.  She will be able to be full weightbearing as tolerated postoperatively.  She is appropriate for chemotherapy and or radiation therapy if indicated by her oncologist postoperatively. -We will admit her to the orthopedic service postoperatively with the medicine team following along. -She will receive physical therapy postoperatively and hopefully move towards a expeditious discharge once pain is controlled. - The risks, benefits, and alternatives were discussed with the patient. There are risks associated with the surgery including, but not limited to, problems with anesthesia (death), infection, differences in leg length/angulation/rotation, fracture of bones, loosening or failure of implants, malunion, nonunion, hematoma (blood accumulation) which may require surgical drainage, blood clots, pulmonary  embolism, nerve injury (foot drop), and blood vessel injury. The patient understands these risks and elects to proceed.     Nicholes Stairs, MD Cell [phone]    01/23/2017 12:21 PM

## 2017-01-23 NOTE — Discharge Instructions (Signed)
Orthopedics discharge instructions:  -Okay for full weightbearing as tolerated to the left lower extremity. -It is okay to get your postoperative bandage wet.  You may shower with this bandage in place.  However, do not submerge underwater. -For the prevention of blood clots take an 81 mg aspirin twice daily for 6 weeks. -Return to see Dr. Stann Mainland in 2 weeks for a wound check.

## 2017-01-23 NOTE — Anesthesia Procedure Notes (Signed)
Procedure Name: Intubation Date/Time: 01/23/2017 1:13 PM Performed by: Jordon Kristiansen D, CRNA Pre-anesthesia Checklist: Patient identified, Emergency Drugs available, Suction available and Patient being monitored Patient Re-evaluated:Patient Re-evaluated prior to induction Oxygen Delivery Method: Circle system utilized Preoxygenation: Pre-oxygenation with 100% oxygen Induction Type: IV induction Ventilation: Mask ventilation without difficulty Laryngoscope Size: Mac and 3 Grade View: Grade II Tube type: Oral Tube size: 7.5 mm Number of attempts: 1 Airway Equipment and Method: Stylet and Oral airway Placement Confirmation: ETT inserted through vocal cords under direct vision,  positive ETCO2 and breath sounds checked- equal and bilateral Secured at: 21 cm Tube secured with: Tape Dental Injury: Teeth and Oropharynx as per pre-operative assessment

## 2017-01-23 NOTE — Anesthesia Preprocedure Evaluation (Signed)
Anesthesia Evaluation  Patient identified by MRN, date of birth, ID band Patient awake    Reviewed: Allergy & Precautions, NPO status , Patient's Chart, lab work & pertinent test results  Airway Mallampati: I  TM Distance: >3 FB Neck ROM: Full    Dental   Pulmonary former smoker,    Pulmonary exam normal        Cardiovascular hypertension, + CAD  Normal cardiovascular exam+ Valvular Problems/Murmurs MR      Neuro/Psych Anxiety Depression    GI/Hepatic GERD  Medicated and Controlled,  Endo/Other    Renal/GU      Musculoskeletal   Abdominal   Peds  Hematology   Anesthesia Other Findings   Reproductive/Obstetrics                             Anesthesia Physical Anesthesia Plan  ASA: III  Anesthesia Plan: General   Post-op Pain Management:    Induction: Intravenous  PONV Risk Score and Plan: 3 and Ondansetron and Treatment may vary due to age or medical condition  Airway Management Planned: Oral ETT  Additional Equipment:   Intra-op Plan:   Post-operative Plan: Extubation in OR  Informed Consent: I have reviewed the patients History and Physical, chart, labs and discussed the procedure including the risks, benefits and alternatives for the proposed anesthesia with the patient or authorized representative who has indicated his/her understanding and acceptance.     Plan Discussed with: CRNA and Surgeon  Anesthesia Plan Comments:         Anesthesia Quick Evaluation

## 2017-01-23 NOTE — Transfer of Care (Signed)
Immediate Anesthesia Transfer of Care Note  Patient: Theresa Levine [facility]  Procedure(s) Performed: INTRAMEDULLARY (IM) NAIL FEMORAL (Left )  Patient Location: PACU  Anesthesia Type:General  Level of Consciousness: awake, alert  and oriented  Airway & Oxygen Therapy: Patient Spontanous Breathing and Patient connected to face mask oxygen  Post-op Assessment: Report given to RN and Post -op Vital signs reviewed and stable  Post vital signs: Reviewed and stable  Last Vitals:  Vitals:   01/23/17 1031  BP: 97/75  Pulse: 77  Resp: 18  Temp: 36.6 C  SpO2: 98%    Last Pain:  Vitals:   01/23/17 1212  TempSrc:   PainSc: 4       Patients Stated Pain Goal: 5 (49/35/52 1747)  Complications: No apparent anesthesia complications

## 2017-01-24 ENCOUNTER — Encounter (HOSPITAL_COMMUNITY): Payer: Self-pay | Admitting: Orthopedic Surgery

## 2017-01-24 LAB — CBC
HCT: 29.9 % — ABNORMAL LOW (ref 36.0–46.0)
HEMOGLOBIN: 10 g/dL — AB (ref 12.0–15.0)
MCH: 29.9 pg (ref 26.0–34.0)
MCHC: 33.4 g/dL (ref 30.0–36.0)
MCV: 89.5 fL (ref 78.0–100.0)
Platelets: 194 10*3/uL (ref 150–400)
RBC: 3.34 MIL/uL — ABNORMAL LOW (ref 3.87–5.11)
RDW: 15.5 % (ref 11.5–15.5)
WBC: 4.5 10*3/uL (ref 4.0–10.5)

## 2017-01-24 LAB — BASIC METABOLIC PANEL
ANION GAP: 8 (ref 5–15)
BUN: 8 mg/dL (ref 6–20)
CALCIUM: 8.8 mg/dL — AB (ref 8.9–10.3)
CO2: 30 mmol/L (ref 22–32)
CREATININE: 0.79 mg/dL (ref 0.44–1.00)
Chloride: 94 mmol/L — ABNORMAL LOW (ref 101–111)
GFR calc non Af Amer: >60 mL/min (ref >60)
Glucose, Bld: 91 mg/dL (ref 65–99)
Potassium: 3.9 mmol/L (ref 3.5–5.1)
SODIUM: 132 mmol/L — AB (ref 135–145)

## 2017-01-24 MED ORDER — ASPIRIN 81 MG PO CHEW
81.0000 mg | CHEWABLE_TABLET | Freq: Every day | ORAL | Status: DC
  Administered 2017-01-24 – 2017-01-27 (×4): 81 mg via ORAL
  Filled 2017-01-24 (×4): qty 1

## 2017-01-24 MED ORDER — SODIUM CHLORIDE 0.9 % IV SOLN
INTRAVENOUS | Status: DC
  Administered 2017-01-24: 09:00:00 100 mL/h via INTRAVENOUS
  Administered 2017-01-25: 14:00:00 via INTRAVENOUS

## 2017-01-24 MED ORDER — SODIUM CHLORIDE 0.9 % IV BOLUS (SEPSIS)
1000.0000 mL | Freq: Once | INTRAVENOUS | Status: AC
Start: 2017-01-24 — End: 2017-01-24

## 2017-01-24 MED ORDER — ALUM & MAG HYDROXIDE-SIMETH 200-200-20 MG/5ML PO SUSP
30.0000 mL | ORAL | Status: DC | PRN
  Administered 2017-01-24: 15:00:00 30 mL via ORAL
  Filled 2017-01-24: qty 30

## 2017-01-24 MED ORDER — OXYCODONE HCL 5 MG PO TABS
5.0000 mg | ORAL_TABLET | ORAL | 0 refills | Status: DC | PRN
Start: 2017-01-24 — End: 2017-02-13

## 2017-01-24 NOTE — Evaluation (Signed)
Occupational Therapy Evaluation Patient Details Name: Theresa Levine MRN: [id number] DOB: [birth year] Today's Date: 01/24/2017    History of Present Illness Pt admitted  for IM nailing left femur for impending fracture. .  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   Clinical Impression   This 66 year old female was admitted for the above sx.  She will benefit from continued OT.  Pt has been wearing TLSO and needs additional reinforcement with wearing and application of this also.  Pt needs up to mod A for sit to stand and up to max A for LB adls. Goals in acute are for min A level    Follow Up Recommendations  CIR    Equipment Recommendations  None recommended by OT    Recommendations for Other Services       Precautions / Restrictions Precautions Precautions: Fall;Back Precaution Comments: TLSO Required Braces or Orthoses: Spinal Brace Restrictions Weight Bearing Restrictions: No LLE Weight Bearing: Weight bearing as tolerated      Mobility Bed Mobility Overal bed mobility: Needs Assistance Bed Mobility: Rolling;Sidelying to Sit Rolling: Min assist Sidelying to sit: Mod assist;+2 for physical assistance       General bed mobility comments: assist for legs and trunk  Transfers   Equipment used: Rolling walker (2 wheeled)   Sit to Stand: Min assist;Mod assist         General transfer comment: min A from higher bed; mod A from 3:1 commode    Balance                                           ADL either performed or assessed with clinical judgement   ADL Overall ADL's : Needs assistance/impaired Eating/Feeding: Independent   Grooming: Set up;Sitting   Upper Body Bathing: Set up;Sitting   Lower Body Bathing: Maximal assistance;Sit to/from stand   Upper Body Dressing : Minimal assistance;Sitting   Lower Body Dressing: Maximal assistance;Sit to/from  stand   Toilet Transfer: Moderate assistance;Minimal assistance;+2 for safety/equipment;BSC;RW;Stand-pivot   Toileting- Clothing Manipulation and Hygiene: Moderate assistance;Sit to/from stand         General ADL Comments: SPT to 3:1 then chair.  Pain increased and interferring with adls     Vision         Perception     Praxis      Pertinent Vitals/Pain Pain Score: 9  Pain Location: L hip Pain Descriptors / Indicators: Aching;Sore Pain Intervention(s): Repositioned;Limited activity within patient's tolerance;Premedicated before session;Monitored during session;Ice applied     Hand Dominance     Extremity/Trunk Assessment Upper Extremity Assessment Upper Extremity Assessment: Generalized weakness           Communication Communication Communication: No difficulties   Cognition Arousal/Alertness: Awake/alert Behavior During Therapy: WFL for tasks assessed/performed                         Memory: Decreased recall of precautions             General Comments       Exercises     Shoulder Instructions      Home Living Family/patient expects to be discharged to:: Private residence Living Arrangements: Other relatives Available Help at Discharge: Family Type of Home: House Home Access: Stairs  to enter Entrance Stairs-Number of Steps: 2 to porch then one into house.  2 steps down into the living room         Bathroom Shower/Tub: Walk-in shower(sponge bathed)   Bathroom Toilet: Standard     Home Equipment: Bedside commode;Walker - 2 wheels;Shower seat   Additional Comments: has been at her house with sister in law coming during the day      Prior Functioning/Environment Level of Independence: Needs assistance        Comments: got up to [facility] herself from flat bed        OT Problem List: Decreased strength;Decreased activity tolerance;Decreased knowledge of precautions;Decreased knowledge of use of DME or AE;Pain      OT  Treatment/Interventions: Self-care/ADL training;DME and/or AE instruction;Patient/family education;Therapeutic activities    OT Goals(Current goals can be found in the care plan section) Acute Rehab OT Goals Patient Stated Goal: less pain OT Goal Formulation: With patient Time For Goal Achievement: 01/31/17 Potential to Achieve Goals: Good ADL Goals Pt Will Perform Lower Body Bathing: with min assist;with adaptive equipment;sit to/from stand Pt Will Perform Lower Body Dressing: with min assist;with adaptive equipment;sit to/from stand Pt Will Transfer to Toilet: with min guard assist;bedside commode;stand pivot transfer Pt Will Perform Toileting - Clothing Manipulation and hygiene: with min assist;sit to/from stand Additional ADL Goal #1: pt will perform bed mobility (rolling, sidelying<>sit) from hospital bed with min guard following back precautions  OT Frequency: Min 2X/week   Barriers to D/C:            Co-evaluation   Reason for Co-Treatment: For patient/therapist safety PT goals addressed during session: Mobility/safety with mobility OT goals addressed during session: ADL's and self-care      AM-PAC PT "6 Clicks" Daily Activity     Outcome Measure Help from another person eating meals?: None Help from another person taking care of personal grooming?: A Little Help from another person toileting, which includes using toliet, bedpan, or urinal?: A Lot Help from another person bathing (including washing, rinsing, drying)?: A Lot Help from another person to put on and taking off regular upper body clothing?: A Little Help from another person to put on and taking off regular lower body clothing?: A Lot 6 Click Score: 16   End of Session    Activity Tolerance: Patient limited by pain Patient left: in chair;with call bell/phone within reach;with chair alarm set;with family/visitor present  OT Visit Diagnosis: Pain Pain - Right/Left: Left Pain - part of body: Hip                 Time: 5366-4403 OT Time Calculation (min): 33 min Charges:  OT General Charges $OT Visit: 1 Visit OT Evaluation $OT Eval Low Complexity: 1 Low G-Codes:     [place], OTR/L [phone] 01/24/2017  Theresa Levine 01/24/2017, 10:35 AM

## 2017-01-24 NOTE — Progress Notes (Signed)
Physical Therapy Treatment Patient Details Name: Theresa Levine MRN: [id number] DOB: [birth year] Today's Date: 01/24/2017    History of Present Illness Pt admitted  for IM nailing left femur for impending fracture. .  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    PT Comments    The patient  Was assisted to [facility]. No urine output. RN aware. Discussed with patient DC plan options:SNF of HHPT. The sister in law not present. Reiterated to patient that she needs someone with her most hours of the day and night. The patient also exhibits some forgetfulness  With  concernfor being alone. Continue PT.  Follow Up Recommendations  SNF(HHPT if sister will be available 12/7)     Equipment Recommendations  None recommended by PT    Recommendations for Other Services       Precautions / Restrictions Precautions Precautions: Fall;Back Precaution Comments: TLSO Required Braces or Orthoses: Spinal Brace Spinal Brace: Thoracolumbosacral orthotic;Applied in sitting position Restrictions LLE Weight Bearing: Weight bearing as tolerated    Mobility  Bed Mobility Overal bed mobility: Needs Assistance Bed Mobility: Sit to Supine       Sit to supine: Mod assist      Transfers Overall transfer level: Needs assistance Equipment used: Rolling walker (2 wheeled) Transfers: Sit to/from [facility] Sit to Stand: Min assist Stand pivot transfers: Mod assist       General transfer comment: stedy assist to rise from recliner and BSC, pivot to [facility] then back up steps to bed.  Ambulation/Gait       Gait Pattern/deviations: Antalgic Gait velocity: decreased       Stairs            Wheelchair Mobility    Modified Rankin (Stroke Patients Only)       Balance                                            Cognition Arousal/Alertness: Awake/alert                                      General Comments: easily distracted with multiple activiities, constantly states" I forget things".      Exercises      General Comments        Pertinent Vitals/Pain Pain Score: 5  Pain Location: L hip Pain Descriptors / Indicators: Aching;Sore;Tightness Pain Intervention(s): Premedicated before session;Repositioned;Monitored during session    Home Living                      Prior Function            PT Goals (current goals can now be found in the care plan section) Progress towards PT goals: Progressing toward goals    Frequency    Min 3X/week      PT Plan Discharge plan needs to be updated    Co-evaluation              AM-PAC PT "6 Clicks" Daily Activity  Outcome Measure  Difficulty turning over in bed (including adjusting bedclothes, sheets and blankets)?: Unable Difficulty moving from lying on back to sitting on the side of the bed? : Unable Difficulty  sitting down on and standing up from a chair with arms (e.g., wheelchair, bedside commode, etc,.)?: Unable Help needed moving to and from a bed to chair (including a wheelchair)?: Total Help needed walking in hospital room?: Total Help needed climbing 3-5 steps with a railing? : Total 6 Click Score: 6    End of Session   Activity Tolerance: Patient tolerated treatment well Patient left: in bed;with call bell/phone within reach Nurse Communication: Mobility status PT Visit Diagnosis: Unsteadiness on feet (R26.81);Difficulty in walking, not elsewhere classified (R26.2);Pain Pain - Right/Left: Left Pain - part of body: Hip     Time: 1771-1657 PT Time Calculation (min) (ACUTE ONLY): 36 min  Charges:  $Therapeutic Activity: 23-37 mins                    G CodesTresa Endo PT [phone] }   [name] 01/24/2017, 3:37 PM

## 2017-01-24 NOTE — Progress Notes (Signed)
Rehab Admissions Coordinator Note:  Patient was screened by Retta Diones for appropriateness for an Inpatient Acute Rehab Consult.  At this time, we are recommending [facility].  Given current diagnosis and [employer] insurance carrier, it is unlikely that inpatient rehab would be approved.  Recommend SNF for therapies.  Call me for questions.  Retta Diones 01/24/2017, 11:49 AM  I can be reached at [phone].

## 2017-01-24 NOTE — Progress Notes (Signed)
   Subjective:  Patient reports pain as mild to moderate.  No increased pain from before surgery.  Tolerating therapy this am.  No complaints of SOB/CP/V/N.  She has not voided yet and did require an In and out x 1.  She has been asymptomatically hypotensive.  Objective:   VITALS:   Vitals:   01/23/17 2158 01/24/17 0142 01/24/17 0549 01/24/17 0928  BP: 104/66 108/76 108/73 106/62  Pulse: (!) 112 (!) 115 (!) 123 93  Resp: 18 18 18 17   Temp: 98.6 F (37 C) 98.6 F (37 C) 98.6 F (37 C) 98.2 F (36.8 C)  TempSrc: Oral Oral Oral Oral  SpO2: 98% 100% 100% 100%  Weight:      Height:        Neurologically intact Neurovascular intact Sensation intact distally Intact pulses distally Dorsiflexion/Plantar flexion intact Incision: dressing C/D/I   Lab Results  Component Value Date   WBC 4.5 01/24/2017   HGB 10.0 (L) 01/24/2017   HCT 29.9 (L) 01/24/2017   MCV 89.5 01/24/2017   PLT 194 01/24/2017   BMET    Component Value Date/Time   NA 132 (L) 01/24/2017 0600   K 3.9 01/24/2017 0600   CL 94 (L) 01/24/2017 0600   CO2 30 01/24/2017 0600   GLUCOSE 91 01/24/2017 0600   BUN 8 01/24/2017 0600   CREATININE 0.79 01/24/2017 0600   CREATININE 0.92 09/16/2015 1040   CALCIUM 8.8 (L) 01/24/2017 0600   GFRNONAA >60 01/24/2017 0600   GFRAA >60 01/24/2017 0600     Assessment/Plan: 1 Day Post-Op   Principal Problem:   Breast cancer metastasized to bone/Bone metastases/Bony metastasis/H/o Breast Cancer Active Problems:   Lytic bone lesion of left femur   Up with therapy WBAT to LLE Maintain dressings, and may shower with bandages in place Iv fluid bolus initiated for dehydration, and holding diuretics for now. Pain control lovenox in house and dc home on bid asa for DVT ppx, continue SCDs   Appreciate hospitalists assistance with medical management  Possible home tomorrow v Friday.   Nicholes Stairs 01/24/2017, 11:53 AM   Geralynn Rile, MD [phone]

## 2017-01-24 NOTE — Progress Notes (Signed)
Physical Therapy Treatment Patient Details Name: Theresa Levine MRN: [id number] DOB: [birth year] Today's Date: 01/24/2017    History of Present Illness Pt admitted  for IM nailing left femur for impending fracture. .  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    PT Comments    The patient complains of left hip pain.  Assisted back to bed with stand and pivot with small steps backward to bed.  Follow Up Recommendations  CIR     Equipment Recommendations  None recommended by PT    Recommendations for Other Services       Precautions / Restrictions Precautions Precautions: Fall;Back Precaution Comments: TLSO Required Braces or Orthoses: Spinal Brace Spinal Brace: Thoracolumbosacral orthotic;Applied in sitting position Restrictions Weight Bearing Restrictions: No LLE Weight Bearing: Weight bearing as tolerated    Mobility  Bed Mobility Overal bed mobility: Needs Assistance Bed Mobility: Sit to Supine Rolling: Min assist Sidelying to sit: Mod assist;+2 for physical assistance   Sit to supine: Mod assist   General bed mobility comments: assist for legs and trunk back onto bed.  Transfers Overall transfer level: Needs assistance Equipment used: Rolling walker (2 wheeled) Transfers: Sit to/from [facility] Sit to Stand: Min assist;Mod assist Stand pivot transfers: Mod assist;+2 physical assistance;+2 safety/equipment       General transfer comment: mod assist from recliner, Steps to turn and back up to bed, cues for sequence, strteady assist for posture and balance.   Ambulation/Gait       Gait Pattern/deviations: Antalgic;Step-to pattern         Financial trader Rankin (Stroke Patients Only)       Balance                                            Cognition Arousal/Alertness:  Awake/alert Behavior During Therapy: WFL for tasks assessed/performed Overall Cognitive Status: Within Functional Limits for tasks assessed                       Memory: Decreased recall of precautions         General Comments: easily distracted with multiple activiities      Exercises      General Comments        Pertinent Vitals/Pain Pain Score: 9  Faces Pain Scale: Hurts even more Pain Location: L hip Pain Descriptors / Indicators: Aching;Sore Pain Intervention(s): Monitored during session;Repositioned;Premedicated before session;Ice applied    Home Living Family/patient expects to be discharged to:: Private residence Living Arrangements: Other relatives Available Help at Discharge: Family Type of Home: House Home Access: Stairs to enter     Home Equipment: Bedside commode;Walker - 2 wheels;Shower seat Additional Comments: has been at her house with sister in law coming during the day    Prior Function Level of Independence: Needs assistance      Comments: got up to [facility] herself from flat bed   PT Goals (current goals can now be found in the care plan section) Acute Rehab PT Goals Patient Stated Goal: less pain PT Goal Formulation: With patient Time For Goal Achievement: 02/07/17 Potential to Achieve Goals: Fair Progress towards PT goals: Progressing toward goals  Frequency    Min 3X/week      PT Plan Current plan remains appropriate    Co-evaluation PT/OT/SLP Co-Evaluation/Treatment: Yes Reason for Co-Treatment: For patient/therapist safety PT goals addressed during session: Mobility/safety with mobility OT goals addressed during session: ADL's and self-care      AM-PAC PT "6 Clicks" Daily Activity  Outcome Measure  Difficulty turning over in bed (including adjusting bedclothes, sheets and blankets)?: Unable Difficulty moving from lying on back to sitting on the side of the bed? : Unable Difficulty sitting down on and standing up  from a chair with arms (e.g., wheelchair, bedside commode, etc,.)?: Unable Help needed moving to and from a bed to chair (including a wheelchair)?: Total Help needed walking in hospital room?: Total Help needed climbing 3-5 steps with a railing? : Total 6 Click Score: 6    End of Session   Activity Tolerance: Patient limited by fatigue;Patient limited by pain Patient left: in bed;with call bell/phone within reach;with nursing/sitter in room Nurse Communication: Mobility status PT Visit Diagnosis: Unsteadiness on feet (R26.81);Difficulty in walking, not elsewhere classified (R26.2);Pain Pain - Right/Left: Left Pain - part of body: Hip     Time: 1040-1050 PT Time Calculation (min) (ACUTE ONLY): 10 min  Charges:  $Therapeutic Activity: 8-22 mins                    G CodesTresa Endo PT [phone]    [name] 01/24/2017, 11:39 AM

## 2017-01-24 NOTE — Evaluation (Signed)
Physical Therapy Evaluation Patient Details Name: Theresa Levine MRN: [id number] DOB: 04/16/1950 Today's Date: 01/24/2017   History of Present Illness  Pt admitted  for IM nailing left femur for impending fracture. .  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  Clinical Impression  The patient is requiring assist for Left leg and trunk for mobility. Patient  Could benefit from CIR vs SNF unless patient has care givers at night. Pt admitted with above diagnosis. Pt currently with functional limitations due to the deficits listed below (see PT Problem List).  Pt will benefit from skilled PT to increase their independence and safety with mobility to allow discharge to the venue listed below.       Follow Up Recommendations CIR    Equipment Recommendations  None recommended by PT    Recommendations for Other Services       Precautions / Restrictions Precautions Precautions: Fall;Back Precaution Comments: TLSO Required Braces or Orthoses: Spinal Brace Restrictions Weight Bearing Restrictions: No LLE Weight Bearing: Weight bearing as tolerated      Mobility  Bed Mobility Overal bed mobility: Needs Assistance Bed Mobility: Rolling;Sidelying to Sit Rolling: Min assist Sidelying to sit: Mod assist;+2 for physical assistance       General bed mobility comments: assist for legs and trunk, bed pad to slide forward  Transfers Overall transfer level: Needs assistance Equipment used: Rolling walker (2 wheeled)   Sit to Stand: Min assist;Mod assist         General transfer comment: min A from higher bed; mod A from 3:1 commode  Ambulation/Gait                Stairs            Wheelchair Mobility    Modified Rankin (Stroke Patients Only)       Balance                                             Pertinent Vitals/Pain Pain Score: 9  Pain Location: L  hip Pain Descriptors / Indicators: Aching;Sore Pain Intervention(s): Monitored during session;Repositioned;Patient requesting pain meds-RN notified;Ice applied    Home Living Family/patient expects to be discharged to:: Private residence Living Arrangements: Other relatives Available Help at Discharge: Family Type of Home: House Home Access: Stairs to enter   [employer] of Steps: 2 to porch then one into house.  2 steps down into the living room   Home Equipment: Bedside commode;Walker - 2 wheels;Shower seat Additional Comments: has been at her house with sister in law coming during the day    Prior Function Level of Independence: Needs assistance         Comments: got up to [facility] herself from flat bed     Hand Dominance        Extremity/Trunk Assessment   Upper Extremity Assessment Upper Extremity Assessment: Defer to OT evaluation    Lower Extremity Assessment Lower Extremity Assessment: LLE deficits/detail LLE Deficits / Details: requires assist to  move left leg    Cervical / Trunk Assessment Cervical / Trunk Assessment: Normal  Communication   Communication: No difficulties  Cognition Arousal/Alertness: Awake/alert Behavior During Therapy: WFL for tasks assessed/performed Overall Cognitive Status: Within Functional Limits for tasks assessed  Memory: Decreased recall of precautions         General Comments: easily distracted with multiple activiities      General Comments      Exercises     Assessment/Plan    PT Assessment Patient needs continued PT services  PT Problem List Decreased strength;Decreased activity tolerance;Decreased balance;Decreased mobility;Decreased knowledge of use of DME;Pain       PT Treatment Interventions DME instruction;Gait training;Functional mobility training;Therapeutic activities;Therapeutic exercise;Patient/family education    PT Goals (Current goals can be found in the  Care Plan section)  Acute Rehab PT Goals Patient Stated Goal: less pain PT Goal Formulation: With patient Time For Goal Achievement: 02/07/17 Potential to Achieve Goals: Fair    Frequency Min 3X/week   Barriers to discharge Decreased caregiver support has no assist at night    Co-evaluation PT/OT/SLP Co-Evaluation/Treatment: Yes Reason for Co-Treatment: For patient/therapist safety PT goals addressed during session: Mobility/safety with mobility OT goals addressed during session: ADL's and self-care       AM-PAC PT "6 Clicks" Daily Activity  Outcome Measure Difficulty turning over in bed (including adjusting bedclothes, sheets and blankets)?: Unable Difficulty moving from lying on back to sitting on the side of the bed? : Unable Difficulty sitting down on and standing up from a chair with arms (e.g., wheelchair, bedside commode, etc,.)?: Unable Help needed moving to and from a bed to chair (including a wheelchair)?: Total Help needed walking in hospital room?: Total Help needed climbing 3-5 steps with a railing? : Total 6 Click Score: 6    End of Session   Activity Tolerance: Patient limited by fatigue;Patient limited by pain Patient left: in chair;with call bell/phone within reach;with bed alarm set   PT Visit Diagnosis: Unsteadiness on feet (R26.81);Difficulty in walking, not elsewhere classified (R26.2);Pain Pain - Right/Left: Left Pain - part of body: Hip    Time: 6754-4920 PT Time Calculation (min) (ACUTE ONLY): 31 min   Charges:   PT Evaluation $PT Eval Moderate Complexity: 1 Mod     PT G Codes:        [place] PT [phone]   [name] 01/24/2017, 10:43 AM

## 2017-01-25 LAB — BASIC METABOLIC PANEL
Anion gap: 6 (ref 5–15)
BUN: 6 mg/dL (ref 6–20)
CALCIUM: 8.6 mg/dL — AB (ref 8.9–10.3)
CO2: 28 mmol/L (ref 22–32)
Chloride: 97 mmol/L — ABNORMAL LOW (ref 101–111)
Creatinine, Ser: 0.61 mg/dL (ref 0.44–1.00)
GFR calc Af Amer: >60 mL/min (ref >60)
GLUCOSE: 92 mg/dL (ref 65–99)
POTASSIUM: 4.2 mmol/L (ref 3.5–5.1)
Sodium: 131 mmol/L — ABNORMAL LOW (ref 135–145)

## 2017-01-25 LAB — CBC
HEMATOCRIT: 27 % — AB (ref 36.0–46.0)
Hemoglobin: 9.3 g/dL — ABNORMAL LOW (ref 12.0–15.0)
MCH: 30.6 pg (ref 26.0–34.0)
MCHC: 34.4 g/dL (ref 30.0–36.0)
MCV: 88.8 fL (ref 78.0–100.0)
PLATELETS: 185 10*3/uL (ref 150–400)
RBC: 3.04 MIL/uL — ABNORMAL LOW (ref 3.87–5.11)
RDW: 15.2 % (ref 11.5–15.5)
WBC: 4.7 10*3/uL (ref 4.0–10.5)

## 2017-01-25 MED ORDER — HYDROMORPHONE HCL 1 MG/ML IJ SOLN: 1.0000 mg | INTRAMUSCULAR | Status: DC | PRN

## 2017-01-25 MED ORDER — TAMSULOSIN HCL 0.4 MG PO CAPS
0.4000 mg | ORAL_CAPSULE | Freq: Every day | ORAL | Status: DC
  Administered 2017-01-25 – 2017-01-27 (×3): 0.4 mg via ORAL
  Filled 2017-01-25 (×3): qty 1

## 2017-01-25 MED ORDER — OXYCODONE HCL 5 MG PO TABS
5.0000 mg | ORAL_TABLET | ORAL | Status: DC | PRN
  Administered 2017-01-25: 17:00:00 10 mg via ORAL
  Administered 2017-01-25: 11:00:00 5 mg via ORAL
  Administered 2017-01-25 (×2): 10 mg via ORAL
  Administered 2017-01-26: 05:00:00 5 mg via ORAL
  Administered 2017-01-26 (×3): 10 mg via ORAL
  Administered 2017-01-26: 5 mg via ORAL
  Administered 2017-01-26: 10 mg via ORAL
  Filled 2017-01-25: qty 1
  Filled 2017-01-25 (×2): qty 2
  Filled 2017-01-25: qty 1
  Filled 2017-01-25 (×6): qty 2

## 2017-01-25 NOTE — Discharge Summary (Signed)
Physician Discharge Summary  Patient ID: Theresa Levine MRN: [id number] DOB/AGE: 68-Feb-1952 68 y.o.  Admit date: 01/23/2017 Discharge date: 01/27/2017  Admission Diagnoses: lytic bone lesion of L femur, hx of breast CA; hyperlipidemia; tobacco use; HTN; PSVT; fatigue; palpitations; hx of vertigo; coronary astherosclerosis; bone mets; back/leg pain; left hip pain; hx of hypomagnesemia  Discharge Diagnoses:  Principal Problem:   Breast cancer metastasized to bone/Bone metastases/Bony metastasis/H/o Breast Cancer Active Problems:   Lytic bone lesion of left femur same as above  Discharged Condition: stable  Hospital Course: Patient is a 66 year old female that presented to [facility] for elective L femoral IM nailing by Dr. Victorino December, due to lytic lesion of L femur, on 01/23/17.  The patient tolerated the procedure well without complication.  She was then admitted to the hospital.  Internal medicine was consulted.  She worked well with therapy.  She had a bladder scan and catheter placed on 01/25/17 due to incontinence.  The catheter was removed later that day.  She was able to urinate prior to discharge.  She will be D/C'd home with HHPT on 01/27/17.  Consults: Internal medicine  Significant Diagnostic Studies: labs: cbc to ensure limited blood loss, radiology: X-Ray: to ensure satisfactory anatomic alignment during operative procedure. and bladder scan  Treatments: IV hydration, antibiotics: Ancef, analgesia: acetaminophen, Vicodin, Fentanyl, and Dilaudid, anticoagulation: lovenox, surgery: as stated above and urinary catheter  Discharge Exam: Blood pressure 136/77, pulse 92, temperature 98.5 F (36.9 C), temperature source Oral, resp. rate 17, height 5\' 6"  (1.676 m), weight 79.4 kg (175 lb), SpO2 92 %. General: WDWN patient in NAD. Psych:  Appropriate mood and affect. Neuro:  A&O x 3, Moving all extremities, sensation intact to light touch HEENT:  EOMs intact Chest:  Even  non-labored respirations Skin:  Incision C/D/I, no rashes or lesions Extremities: warm/dry, mild edema, no erythema or echymosis.  No lymphadenopathy. Pulses: Popliteus 2+ MSK:  ROM: lacks 5 degrees of TKE, MMT: able to perform quad set, (-) Homan's   Disposition: 03-Skilled [place]  Discharge Instructions    Call MD / Call 911   Complete by:  As directed    If you experience chest pain or shortness of breath, CALL 911 and be transported to the hospital emergency room.  If you develope a fever above 101 F, pus (white drainage) or increased drainage or redness at the wound, or calf pain, call your surgeon's office.   Constipation Prevention   Complete by:  As directed    Drink plenty of fluids.  Prune juice may be helpful.  You may use a stool softener, such as Colace (over the counter) 100 mg twice a day.  Use MiraLax (over the counter) for constipation as needed.   Diet - low sodium heart healthy   Complete by:  As directed    Increase activity slowly as tolerated   Complete by:  As directed      Allergies as of 01/25/2017      Reactions   Crestor [rosuvastatin Calcium] Other (See Comments)   Myalgias   Ezetimibe    GI Upset (intolerance)   Lorazepam    Loopy, hallucinations, memory loss    Sulfa Antibiotics    unknown      Medication List    TAKE these medications   ALPRAZolam 0.25 MG tablet Commonly known as:  XANAX Take 0.125 mg 2 (two) times daily by mouth.   aspirin 81 MG tablet Take 81 mg by  mouth daily.   docusate sodium 100 MG capsule Commonly known as:  COLACE Take 200 mg at bedtime by mouth.   esomeprazole 20 MG capsule Commonly known as:  NEXIUM Take 40 mg daily at 12 noon by mouth.   feeding supplement (ENSURE ENLIVE) Liqd Take 237 mLs 2 (two) times daily between meals by mouth. What changed:  when to take this   fentaNYL 50 MCG/HR Commonly known as:  DURAGESIC - dosed mcg/hr Place 1 patch (50 mcg total) every 3 (three) days onto the  skin.   furosemide 40 MG tablet Commonly known as:  LASIX Take 1 tablet (40 mg total) daily for 7 days by mouth.   levothyroxine 75 MCG tablet Commonly known as:  LEVOTHROID Take 0.5 tablets (37.5 mcg total) daily before breakfast by mouth.   LORazepam 0.5 MG tablet Commonly known as:  ATIVAN Take 1 tablet (0.5 mg total) 2 (two) times daily by mouth.   nicotine 21 mg/24hr patch Commonly known as:  NICODERM CQ - dosed in mg/24 hours Place 1 patch (21 mg total) daily onto the skin.   oxyCODONE 5 MG immediate release tablet Commonly known as:  Oxy IR/ROXICODONE Take 1 tablet (5 mg total) every 4 (four) hours as needed by mouth for moderate pain. What changed:  Another medication with the same name was added. Make sure you understand how and when to take each.   oxyCODONE 5 MG immediate release tablet Commonly known as:  Oxy IR/ROXICODONE Take 1-2 tablets (5-10 mg total) by mouth every 3 (three) hours as needed for breakthrough pain. What changed:  You were already taking a medication with the same name, and this prescription was added. Make sure you understand how and when to take each.   PARoxetine 20 MG tablet Commonly known as:  PAXIL Take 1 tablet (20 mg total) daily by mouth.   potassium chloride SA 20 MEQ tablet Commonly known as:  K-DUR,KLOR-CON Take 1 tablet (20 mEq total) daily by mouth.      Follow-up Information    Nicholes Stairs, MD. Schedule an appointment as soon as possible for a visit in 2 week(s).   Specialty:  Orthopedic Surgery Why:  For suture removal, For wound re-check Contact information: [address] [place] [address] [phone]           Signed: Mechele Claude, [place] Office:  [phone]

## 2017-01-25 NOTE — Care Management Note (Signed)
Case Management Note  Patient Details  Name: [name] MRN: [id number] Date of Birth: 06-05-1950  Subjective/Objective:      Left femoral IM nailing              Action/Plan: Discharge Planning: NCM spoke to pt and sister and offered choice for [facility]. Sister states she is familiar with [facility] for [facility]. Pt has RW and bedside commode at home.   PCP Aletha Halim MD  Expected Discharge Date:  01/25/17               Expected Discharge Plan:  [place]  In-House Referral:  NA  Discharge planning Services  CM Consult  Post Acute Care Choice:  Home Health Choice offered to:  [facility] POA / Guardian, Sibling  DME Arranged:  N/A DME Agency:  NA  HH Arranged:  PT [place] Agency:  [place]  Status of Service:  Completed, signed off  If discussed at [place] of Stay Meetings, dates discussed:    Additional Comments:  Erenest Rasher, RN 01/25/2017, 12:39 PM

## 2017-01-25 NOTE — Progress Notes (Signed)
Subjective: 2 Days Post-Op Procedure(s) (LRB): INTRAMEDULLARY (IM) NAIL FEMORAL (Left)  Patient reports pain as mild to moderate.  Tolerates POs well.  Admits to flatus.  Denies fever, chills, N/V, CP, SOB.  Reports that she plans on being D/C'd home with the help of her sister in law.  Objective:   VITALS:  Temp:  [98.1 F (36.7 C)-99.2 F (37.3 C)] 98.5 F (36.9 C) (11/22 0615) Pulse Rate:  [92-100] 92 (11/22 0615) Resp:  [15-17] 17 (11/22 0615) BP: (93-136)/(55-77) 136/77 (11/22 0615) SpO2:  [92 %-100 %] 92 % (11/22 0615)  General: WDWN patient in NAD. Psych:  Appropriate mood and affect. Neuro:  A&O x 3, Moving all extremities, sensation intact to light touch HEENT:  EOMs intact Chest:  Even non-labored respirations Skin:  Dressing C/D/I, no rashes or lesions Extremities: warm/dry, mild edema, no erythema or echymosis.  No lymphadenopathy. Pulses: Popliteus 2+ MSK:  ROM: lacks 5 degrees of TKE, MMT: able to perform quad set, (-) Homan's    LABS Recent Labs    01/23/17 1715 01/24/17 0600 01/25/17 0617  HGB 10.8* 10.0* 9.3*  WBC 6.0 4.5 4.7  PLT 183 194 185   Recent Labs    01/24/17 0600 01/25/17 0617  NA 132* 131*  K 3.9 4.2  CL 94* 97*  CO2 30 28  BUN 8 6  CREATININE 0.79 0.61  GLUCOSE 91 92   No results for input(s): LABPT, INR in the last 72 hours.   Assessment/Plan: 2 Days Post-Op Procedure(s) (LRB): INTRAMEDULLARY (IM) NAIL FEMORAL (Left)  Patient seen in rounds for Dr. Sheela Stack L LE Up with therapy D/C home with HHPT today. Scripts on chart Plan for outpatient post-op visit with Dr. Stann Mainland.   Mechele Claude, PA-C [facility] Orthopaedics Office:  [phone]

## 2017-01-25 NOTE — Progress Notes (Signed)
Physical Therapy Treatment Patient Details Name: Theresa Levine MRN: [id number] DOB: 1951-02-12 Today's Date: 01/25/2017    History of Present Illness  Pt with hx of breast CA s/p mastectomy and now with mets to L4 and L femur (impending pathologic fx per Dr Stann Mainland consult). s/p L femur IM nail  on 01/23/17    PT Comments    Pt progressing slowly; they decline SNF; will need hospital bed and likely need ambulance transport  Home; will continue to follow in acute setting   Follow Up Recommendations  Home health PT;Supervision/Assistance - 24 hour     Equipment Recommendations  Hospital bed    Recommendations for Other Services       Precautions / Restrictions Precautions Precautions: Fall;Back Precaution Comments: TLSO Required Braces or Orthoses: Spinal Brace Spinal Brace: Thoracolumbosacral orthotic;Applied in sitting position Restrictions Weight Bearing Restrictions: No LLE Weight Bearing: Weight bearing as tolerated    Mobility  Bed Mobility Overal bed mobility: Needs Assistance Bed Mobility: Rolling;Sidelying to Sit Rolling: Mod assist Sidelying to sit: Mod assist       General bed mobility comments: assist for legs and trunk   Transfers Overall transfer level: Needs assistance Equipment used: Rolling walker (2 wheeled) Transfers: Sit to/from [facility] Sit to Stand: Min assist;Mod assist Stand pivot transfers: Mod assist;+2 safety/equipment       General transfer comment: multi-modal cues for hand placement and safety, RW position, sequence and posture  Ambulation/Gait             General Gait Details: pivotal steps to chair only--limited by pain   Stairs            Wheelchair Mobility    Modified Rankin (Stroke Patients Only)       Balance   Sitting-balance support: Feet supported;No upper extremity supported Sitting balance-Leahy Scale: Fair     Standing balance support: Bilateral upper extremity  supported Standing balance-Leahy Scale: Poor                              Cognition Arousal/Alertness: Awake/alert Behavior During Therapy: WFL for tasks assessed/performed Overall Cognitive Status: Impaired/Different from baseline Area of Impairment: Following commands;Safety/judgement                       Following Commands: Follows one step commands with increased time Safety/Judgement: Decreased awareness of deficits            Exercises      General Comments        Pertinent Vitals/Pain Pain Assessment: Faces Faces Pain Scale: Hurts whole lot Pain Location: L hip Pain Descriptors / Indicators: Aching;Sore;Tightness Pain Intervention(s): Limited activity within patient's tolerance;Monitored during session;Repositioned    Home Living                      Prior Function            PT Goals (current goals can now be found in the care plan section) Acute Rehab PT Goals Patient Stated Goal: less pain PT Goal Formulation: With patient Time For Goal Achievement: 02/07/17 Potential to Achieve Goals: Fair Progress towards PT goals: Progressing toward goals    Frequency    Min 3X/week      PT Plan Discharge plan needs to be updated    Co-evaluation              AM-PAC PT "6 Clicks"  Daily Activity  Outcome Measure  Difficulty turning over in bed (including adjusting bedclothes, sheets and blankets)?: Unable Difficulty moving from lying on back to sitting on the side of the bed? : Unable Difficulty sitting down on and standing up from a chair with arms (e.g., wheelchair, bedside commode, etc,.)?: Unable Help needed moving to and from a bed to chair (including a wheelchair)?: Total Help needed walking in hospital room?: A Lot Help needed climbing 3-5 steps with a railing? : Total 6 Click Score: 7    End of Session Equipment Utilized During Treatment: Gait belt Activity Tolerance: Patient tolerated treatment  well Patient left: with call bell/phone within reach;in chair;with family/visitor present   PT Visit Diagnosis: Unsteadiness on feet (R26.81);Difficulty in walking, not elsewhere classified (R26.2);Pain Pain - Right/Left: Left Pain - part of body: Hip     Time: 1017-5102 PT Time Calculation (min) (ACUTE ONLY): 35 min  Charges:  $Therapeutic Activity: 23-37 mins                    G CodesKenyon Ana, PT Pager: [phone] 01/25/2017    Kenyon Ana 01/25/2017, 4:09 PM

## 2017-01-25 NOTE — Progress Notes (Signed)
Received a call from the nurse this evening stating that the patient has been unable to void on her own today.  I informed Dr. Stann Mainland.  We will cancel the discharge for today, place a catheter overnight, and add flomax.     Mechele Claude, PA-C [facility] Orthopaedics Office:  [phone]

## 2017-01-26 NOTE — Progress Notes (Signed)
     Subjective: 3 Days Post-Op Procedure(s) (LRB): INTRAMEDULLARY (IM) NAIL FEMORAL (Left)   Patient reports pain as mild, pain controlled. Had some issues with urinary retention yesterday and a foley was placed.  She was also placed on Flomax. We discussed d/c'ing the foley to see how she will do this morning.  We discussed that if she is able to void that we will have her d/c home later today.   Objective:   VITALS:   Vitals:   01/25/17 2152 01/26/17 0542  BP: 127/72 126/70  Pulse: (!) 101 (!) 104  Resp: 17 18  Temp: 98.6 F (37 C) 98.2 F (36.8 C)  SpO2: 98% 96%    Dorsiflexion/Plantar flexion intact Incision: dressing C/D/I No cellulitis present Compartment soft  LABS Recent Labs    01/23/17 1715 01/24/17 0600 01/25/17 0617  HGB 10.8* 10.0* 9.3*  HCT 31.5* 29.9* 27.0*  WBC 6.0 4.5 4.7  PLT 183 194 185    Recent Labs    01/23/17 1033 01/24/17 0600 01/25/17 0617  NA 130* 132* 131*  K 3.8 3.9 4.2  BUN 8 8 6   CREATININE 0.76 0.79 0.61  GLUCOSE 87 91 92     Assessment/Plan: 3 Days Post-Op Procedure(s) (LRB): INTRAMEDULLARY (IM) NAIL FEMORAL (Left) Up with therapy Discharge home Follow up in 2 weeks at [facility]. Follow up with OLIN,Kalifa Cadden D in 2 weeks.  Contact information:  [facility] [address] [place] Dmitry Macomber   PAC  01/26/2017, 8:56 AM

## 2017-01-26 NOTE — Progress Notes (Addendum)
Foley d/c'd at 10am this am. Patient got up twice during the day to try to urinate. Patient has been unable to urinate. She refused to try to get up again due to pain. Bladder scanned patient - 515ml shown. Paged provider on call. New order for insert foley catheter. Continue to monitor.

## 2017-01-26 NOTE — Progress Notes (Signed)
    Durable Medical Equipment  (From admission, onward)        Start     Ordered   01/25/17 1407  For home use only DME Hospital bed  Once    Question Answer Comment  The above medical condition requires: Patient requires the ability to reposition frequently   Bed type Semi-electric      01/25/17 1408    [phone]

## 2017-01-26 NOTE — Progress Notes (Signed)
Physical Therapy Treatment Patient Details Name: Theresa Levine MRN: [id number] DOB: Jan 01, 1951 Today's Date: 01/26/2017    History of Present Illness  Pt with hx of breast CA s/p mastectomy and now with mets to L4 and L femur (impending pathologic fx per Dr Stann Mainland consult). s/p L femur IM nail  on 01/23/17    PT Comments    Pt progressing, incr amb tolerance today;    Follow Up Recommendations  Home health PT;Supervision/Assistance - 24 hour     Equipment Recommendations  Hospital bed    Recommendations for Other Services       Precautions / Restrictions Precautions Precautions: Fall;Back Precaution Comments: TLSO Required Braces or Orthoses: Spinal Brace Spinal Brace: Thoracolumbosacral orthotic;Applied in sitting position Restrictions Weight Bearing Restrictions: No LLE Weight Bearing: Weight bearing as tolerated    Mobility  Bed Mobility Overal bed mobility: Needs Assistance Bed Mobility: Supine to Sit     Supine to sit: HOB elevated;Min assist;Mod assist     General bed mobility comments: assist with LEs, trunk, incr time  Transfers   Equipment used: Rolling walker (2 wheeled) Transfers: Sit to/from Stand Sit to Stand: Min assist;+2 safety/equipment         General transfer comment: light assistance to rise and steady.  Cues for UE/LE placement  Ambulation/Gait Ambulation/Gait assistance: Min assist;+2 safety/equipment Ambulation Distance (Feet): 10 Feet Assistive device: Rolling walker (2 wheeled) Gait Pattern/deviations: Trunk flexed;Antalgic;Step-to pattern     General Gait Details: cues for sequence, posture   Stairs            Wheelchair Mobility    Modified Rankin (Stroke Patients Only)       Balance                                            Cognition Arousal/Alertness: Awake/alert Behavior During Therapy: WFL for tasks assessed/performed                           Following Commands:  Follows one step commands with increased time       General Comments: Pt very talkative, tends to perseverate on pain level; cues to initiate activities      Exercises      General Comments        Pertinent Vitals/Pain Pain Assessment: Faces Faces Pain Scale: Hurts little more Pain Location: L hip Pain Descriptors / Indicators: Aching;Sore Pain Intervention(s): Limited activity within patient's tolerance;Monitored during session;Premedicated before session;Repositioned;Ice applied    Home Living                      Prior Function            PT Goals (current goals can now be found in the care plan section) Acute Rehab PT Goals Patient Stated Goal: less pain PT Goal Formulation: With patient Time For Goal Achievement: 02/07/17 Potential to Achieve Goals: Fair Progress towards PT goals: Progressing toward goals    Frequency    Min 3X/week      PT Plan Current plan remains appropriate    Co-evaluation PT/OT/SLP Co-Evaluation/Treatment: Yes Reason for Co-Treatment: For patient/therapist safety PT goals addressed during session: Mobility/safety with mobility OT goals addressed during session: ADL's and self-care      AM-PAC PT "6 Clicks" Daily Activity  Outcome Measure  Difficulty turning over  in bed (including adjusting bedclothes, sheets and blankets)?: Unable Difficulty moving from lying on back to sitting on the side of the bed? : Unable Difficulty sitting down on and standing up from a chair with arms (e.g., wheelchair, bedside commode, etc,.)?: Unable Help needed moving to and from a bed to chair (including a wheelchair)?: A Little Help needed walking in hospital room?: A Lot Help needed climbing 3-5 steps with a railing? : Total 6 Click Score: 9    End of Session Equipment Utilized During Treatment: Gait belt Activity Tolerance: Patient tolerated treatment well Patient left: with call bell/phone within reach;in chair   PT Visit  Diagnosis: Unsteadiness on feet (R26.81);Difficulty in walking, not elsewhere classified (R26.2);Pain Pain - Right/Left: Left Pain - part of body: Hip     Time: 5364-6803 PT Time Calculation (min) (ACUTE ONLY): 27 min  Charges:  $Gait Training: 8-22 mins                    G CodesKenyon Levine, PT Pager: [phone] 01/26/2017    Theresa Levine 01/26/2017, 1:11 PM

## 2017-01-26 NOTE — Progress Notes (Signed)
Occupational Therapy Treatment Patient Details Name: Theresa Levine MRN: [id number] DOB: 03/11/1950 Today's Date: 01/26/2017    History of present illness  Pt with hx of breast CA s/p mastectomy and now with mets to L4 and L femur (impending pathologic fx per Dr Stann Mainland consult). s/p L femur IM nail  on 01/23/17   OT comments  Increased activity tolerance today.  +2 for ambulating for chair to follow, and +2 bed mobility  Follow Up Recommendations  Supervision/Assistance - 24 hour;Home health OT    Equipment Recommendations  None recommended by OT(has 3:1)    Recommendations for Other Services      Precautions / Restrictions Precautions Precautions: Fall;Back Precaution Comments: TLSO Required Braces or Orthoses: Spinal Brace Restrictions LLE Weight Bearing: Weight bearing as tolerated       Mobility Bed Mobility               General bed mobility comments: supine to sit with HOB raised, Min A +2; light assistance for LLE and trunk. Cues for sequence  Transfers   Equipment used: Rolling walker (2 wheeled)   Sit to Stand: Min assist         General transfer comment: light assistance to rise and steady.  Cues for UE/LE placement    Balance                                           ADL either performed or assessed with clinical judgement   ADL                   Upper Body Dressing : Minimal assistance;Sitting(total A for TLSO)                     General ADL Comments: Pt has not been able to urinate.  Total A for posterior peri care.       Vision       Perception     Praxis      Cognition Arousal/Alertness: Awake/alert Behavior During Therapy: WFL for tasks assessed/performed                                   General Comments: Pt very talkative:  cues to initiate activities        Exercises     Shoulder Instructions       General Comments      Pertinent Vitals/ Pain       Pain  Assessment: Faces Faces Pain Scale: Hurts little more Pain Location: L hip Pain Descriptors / Indicators: Aching;Sore Pain Intervention(s): Limited activity within patient's tolerance;Monitored during session;Premedicated before session;Repositioned;Ice applied  Home Living                                          Prior Functioning/Environment              Frequency           Progress Toward Goals  OT Goals(current goals can now be found in the care plan section)  Progress towards OT goals: Progressing toward goals     Plan      Co-evaluation      Reason for Co-Treatment: For patient/therapist safety  PT goals addressed during session: Mobility/safety with mobility OT goals addressed during session: ADL's and self-care      AM-PAC PT "6 Clicks" Daily Activity     Outcome Measure   Help from another person eating meals?: None Help from another person taking care of personal grooming?: A Little Help from another person toileting, which includes using toliet, bedpan, or urinal?: A Lot Help from another person bathing (including washing, rinsing, drying)?: A Lot Help from another person to put on and taking off regular upper body clothing?: A Little Help from another person to put on and taking off regular lower body clothing?: A Lot 6 Click Score: 16    End of Session    OT Visit Diagnosis: Pain Pain - Right/Left: Left Pain - part of body: Hip   Activity Tolerance Patient tolerated treatment well   Patient Left in chair;with call bell/phone within reach;with chair alarm set;with family/visitor present   Nurse Communication          Time: 0601-5615 OT Time Calculation (min): 28 min  Charges: OT General Charges $OT Visit: 1 Visit OT Treatments $Self Care/Home Management : 8-22 mins  Theresa Levine, Theresa Levine [phone] 01/26/2017   Theresa Levine 01/26/2017, 12:26 PM

## 2017-01-26 NOTE — Progress Notes (Signed)
   01/26/17 1400  PT Visit Information  Last PT Received On 01/26/17  Pt wanted to remain in chair (NT aware); agreeable to exercises  Assistance Needed +2  History of Present Illness Pt with hx of breast CA s/p mastectomy and now with mets to L4 and L femur (impending pathologic fx per Dr Stann Mainland consult). s/p L femur IM nail  on 01/23/17  Subjective Data  Patient Stated Goal less pain  Precautions  Precautions Fall;Back  Precaution Comments TLSO  Required Braces or Orthoses Spinal Brace  Spinal Brace TLSO;Applied in sitting position  Restrictions  Weight Bearing Restrictions No  LLE Weight Bearing WBAT  Pain Assessment  Pain Assessment Faces  Faces Pain Scale 6  Pain Location L hip  Pain Descriptors / Indicators Aching;Sore  Pain Intervention(s) Monitored during session;Ice applied;Repositioned  Cognition  Arousal/Alertness Awake/alert  Behavior During Therapy WFL for tasks assessed/performed  General Exercises - Lower Extremity  Ankle Circles/Pumps AROM;Both;10 reps  Quad Sets AROM;Both;10 reps  Heel Slides AAROM;Left;10 reps  Hip ABduction/ADduction AAROM;Left;10 reps  PT - End of Session  Activity Tolerance Patient tolerated treatment well  Patient left with call bell/phone within reach;in chair  PT - Assessment/Plan  PT Plan Current plan remains appropriate  PT Visit Diagnosis Unsteadiness on feet (R26.81);Difficulty in walking, not elsewhere classified (R26.2);Pain  Pain - Right/Left Left  Pain - part of body Hip  PT Frequency (ACUTE ONLY) Min 3X/week  Follow Up Recommendations Home health PT;Supervision/Assistance - 24 hour  PT equipment Hospital bed  AM-PAC PT "6 Clicks" Daily Activity Outcome Measure  Difficulty turning over in bed (including adjusting bedclothes, sheets and blankets)? 1  Difficulty moving from lying on back to sitting on the side of the bed?  1  Difficulty sitting down on and standing up from a chair with arms (e.g., wheelchair, bedside commode,  etc,.)? 1  Help needed moving to and from a bed to chair (including a wheelchair)? 3  Help needed walking in hospital room? 3  Help needed climbing 3-5 steps with a railing?  1  6 Click Score 10  Mobility G Code  CL  PT Goal Progression  Progress towards PT goals Progressing toward goals  Acute Rehab PT Goals  PT Goal Formulation With patient  Time For Goal Achievement 02/07/17  Potential to Achieve Goals Fair  PT Time Calculation  PT Start Time (ACUTE ONLY) 1347  PT Stop Time (ACUTE ONLY) 1407  PT Time Calculation (min) (ACUTE ONLY) 20 min  PT General Charges  $$ ACUTE PT VISIT 1 Visit  PT Treatments  $Therapeutic Exercise 8-22 mins

## 2017-01-27 NOTE — Progress Notes (Signed)
Discharged from floor via w/c for transport home by car. Belongings & sister-in-law with pt. No changes in assessment. Demaree Liberto, [employer]

## 2017-01-27 NOTE — Progress Notes (Signed)
Subjective: 4 Days Post-Op Procedure(s) (LRB): INTRAMEDULLARY (IM) NAIL FEMORAL (Left) Patient reports pain as moderate to left hip.  Foley in place. Tolerating Po's. Denies SOB,CP,calf pain.   Objective: Vital signs in last 24 hours: Temp:  [98.3 F (36.8 C)-99.1 F (37.3 C)] 98.6 F (37 C) (11/24 0500) Pulse Rate:  [87-101] 101 (11/24 0500) Resp:  [16-18] 18 (11/24 0500) BP: (102-124)/(68-80) 124/68 (11/24 0500) SpO2:  [95 %-99 %] 95 % (11/24 0500)  Intake/Output from previous day: 11/23 0701 - 11/24 0700 In: 560 [P.O.:480; I.V.:80] Out: 1100 [Urine:1100] Intake/Output this shift: No intake/output data recorded.  Recent Labs    01/25/17 0617  HGB 9.3*   Recent Labs    01/25/17 0617  WBC 4.7  RBC 3.04*  HCT 27.0*  PLT 185   Recent Labs    01/25/17 0617  NA 131*  K 4.2  CL 97*  CO2 28  BUN 6  CREATININE 0.61  GLUCOSE 92  CALCIUM 8.6*   No results for input(s): LABPT, INR in the last 72 hours.  Alert and oriented x3. RRR, Lungs clear, BS x4. Left Calf soft and non tender. L hip dressing C/D/I. No DVT signs. No signs of infection or compartment syndrome. LLE grossly neurovascularly intact.   Assessment/Plan: 4 Days Post-Op Procedure(s) (LRB): INTRAMEDULLARY (IM) NAIL FEMORAL (Left) D/c foley this am Up with PT Possible D/c later today, if able to urinate and does well with PT. Call for order Continue current care  Lajean Manes 01/27/2017, 8:17 AM

## 2017-01-28 DIAGNOSIS — F329 Major depressive disorder, single episode, unspecified: Secondary | ICD-10-CM | POA: Diagnosis not present

## 2017-01-28 DIAGNOSIS — C50919 Malignant neoplasm of unspecified site of unspecified female breast: Secondary | ICD-10-CM | POA: Diagnosis not present

## 2017-01-28 DIAGNOSIS — M84552D Pathological fracture in neoplastic disease, left femur, subsequent encounter for fracture with routine healing: Secondary | ICD-10-CM | POA: Diagnosis not present

## 2017-01-28 DIAGNOSIS — C7951 Secondary malignant neoplasm of bone: Secondary | ICD-10-CM | POA: Diagnosis not present

## 2017-01-28 DIAGNOSIS — Z79891 Long term (current) use of opiate analgesic: Secondary | ICD-10-CM | POA: Diagnosis not present

## 2017-01-28 DIAGNOSIS — K219 Gastro-esophageal reflux disease without esophagitis: Secondary | ICD-10-CM | POA: Diagnosis not present

## 2017-01-28 DIAGNOSIS — F419 Anxiety disorder, unspecified: Secondary | ICD-10-CM | POA: Diagnosis not present

## 2017-01-28 DIAGNOSIS — I471 Supraventricular tachycardia: Secondary | ICD-10-CM | POA: Diagnosis not present

## 2017-01-28 DIAGNOSIS — Z7982 Long term (current) use of aspirin: Secondary | ICD-10-CM | POA: Diagnosis not present

## 2017-01-28 DIAGNOSIS — Z87891 Personal history of nicotine dependence: Secondary | ICD-10-CM | POA: Diagnosis not present

## 2017-01-30 DIAGNOSIS — R1084 Generalized abdominal pain: Secondary | ICD-10-CM | POA: Diagnosis not present

## 2017-01-30 DIAGNOSIS — K5900 Constipation, unspecified: Secondary | ICD-10-CM | POA: Diagnosis not present

## 2017-01-31 ENCOUNTER — Other Ambulatory Visit: Payer: Self-pay

## 2017-01-31 ENCOUNTER — Emergency Department (HOSPITAL_COMMUNITY)
Admission: EM | Admit: 2017-01-31 | Discharge: 2017-01-31 | Disposition: A | Discharge status: Home / Self Care | Payer: PPO | Attending: Emergency Medicine | Admitting: Emergency Medicine

## 2017-01-31 ENCOUNTER — Encounter (HOSPITAL_COMMUNITY): Payer: Self-pay | Admitting: *Deleted

## 2017-01-31 DIAGNOSIS — Z87891 Personal history of nicotine dependence: Secondary | ICD-10-CM | POA: Diagnosis not present

## 2017-01-31 DIAGNOSIS — Z79899 Other long term (current) drug therapy: Secondary | ICD-10-CM | POA: Insufficient documentation

## 2017-01-31 DIAGNOSIS — I10 Essential (primary) hypertension: Secondary | ICD-10-CM | POA: Insufficient documentation

## 2017-01-31 DIAGNOSIS — Z7982 Long term (current) use of aspirin: Secondary | ICD-10-CM | POA: Insufficient documentation

## 2017-01-31 DIAGNOSIS — K5903 Drug induced constipation: Secondary | ICD-10-CM | POA: Diagnosis not present

## 2017-01-31 DIAGNOSIS — T402X5A Adverse effect of other opioids, initial encounter: Secondary | ICD-10-CM | POA: Insufficient documentation

## 2017-01-31 DIAGNOSIS — K5641 Fecal impaction: Secondary | ICD-10-CM | POA: Diagnosis not present

## 2017-01-31 DIAGNOSIS — E039 Hypothyroidism, unspecified: Secondary | ICD-10-CM | POA: Diagnosis not present

## 2017-01-31 DIAGNOSIS — I2510 Atherosclerotic heart disease of native coronary artery without angina pectoris: Secondary | ICD-10-CM | POA: Diagnosis not present

## 2017-01-31 DIAGNOSIS — K5909 Other constipation: Secondary | ICD-10-CM | POA: Diagnosis not present

## 2017-01-31 DIAGNOSIS — K5900 Constipation, unspecified: Secondary | ICD-10-CM | POA: Diagnosis present

## 2017-01-31 MED ORDER — PEG 3350/ELECTROLYTES 240 G PO SOLR: ORAL | 0 refills | Status: DC

## 2017-01-31 MED ORDER — MAGNESIUM CITRATE PO SOLN
1.0000 | Freq: Once | ORAL | Status: AC
  Administered 2017-01-31: 1 via ORAL
  Filled 2017-01-31: qty 296

## 2017-01-31 NOTE — ED Triage Notes (Signed)
Pt c/o abdominal pain with constipation; ems reports BS heard

## 2017-01-31 NOTE — ED Notes (Signed)
Pt on bedside commode.

## 2017-01-31 NOTE — ED Notes (Addendum)
Pt states that she is at the hospital because she has not had a bowel movement in 8 days. Pt had surgery on femur Tuesday and is taking pain medication for the surgical site pain. Pt states that she has tried using enemas at home to help her have a bowel movement but has not had any success. Pt states she is currently having pain at her surgical site level of 10 on a scale of 0 to 10. Pt states that she has not taken her pain medication for tonight, but that when she does it controls her pain. She states she is not here for her surgical site but for constipation.

## 2017-01-31 NOTE — ED Provider Notes (Signed)
[facility] EMERGENCY DEPARTMENT Provider Note   CSN: [id number] Arrival date & time: 01/31/17  0012     History   Chief Complaint Chief Complaint  Patient presents with  . Abdominal Pain    HPI Theresa Levine is a 66 y.o. female.  The history is provided by the patient.  She had surgery on her left hip of placement of an intramedullary nail on November 20.  Since then, she has not had a bowel movement.  She had been passing flatus.  She had been taking docusate 200 mg at bedtime and taking senna, but has not had a bowel movement.  She has a sense of rectal urgency.  She has tried to use an enema at home without relief.  She denies abdominal pain, nausea, vomiting.  She has been taking narcotics for postoperative pain.  Past Medical History:  Diagnosis Date  . Anxiety   . Breast cancer ([place])   . Depression   . FHx: chemotherapy 1998   Radiation left in chest in 2008  . Gastroesophageal reflux disease   . History of percutaneous left heart catheterization September 2009   This showed only luminal irregularities, EF was 60%   . History of stress test    ETT 6/17: Poor Ex capacity, Ex 3' (4.6 METs), no ischemic ST changes, hypertensive BP response // ETT-Echo 8/17: normal  . Hyperlipidemia    unable to tolerate Crestor, Lipitor, or pravastatin  . Hypertension   . Hypothyroidism   . Mild mitral regurgitation by prior echocardiogram September 2009   Showed an EF of 55-60%  . Palpitations   . Supraventricular tachycardia ([place])    The patient had an episode of SVT in September 2009. Her heart rate was reported  to be around 240 by EMS., However, there were no strips to evaluate. It did terminate with  adenosine. 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 The patient did have an event monitor done in October 2009 that showed only normal sinus rhythm.   . Tobacco user     Patient Active Problem List   Diagnosis Date Noted  . Hypomagnesemia 01/19/2017  . Lytic bone lesion of left  femur   . Cancer ([place])   . Left hip pain   . Encounter for palliative care   . Goals of care, counseling/discussion   . Bone metastases/Bony metastasis/H/o Breast Cancer 01/12/2017  . Breast cancer metastasized to bone/Bone metastases/Bony metastasis/H/o Breast Cancer 01/12/2017  . Back pain/Leg pain 01/12/2017  . Pain 01/12/2017  . Coronary atherosclerosis of native coronary artery 01/30/2012  . Vertigo 11/17/2010  . Hyperlipidemia 08/31/2008  . Fatigue 08/31/2008  . Essential hypertension 08/19/2008  . PALPITATIONS 08/19/2008  . TOBACCO ABUSE 02/16/2008  . PSVT (paroxysmal supraventricular tachycardia) ([place]) 02/16/2008    Past Surgical History:  Procedure Laterality Date  . CARDIAC CATHETERIZATION  12/02/2006   Eustace Quail, MD. Lumpectomy and radical nide dissection of the left breast  . FEMUR IM NAIL Left 01/23/2017   Procedure: INTRAMEDULLARY (IM) NAIL FEMORAL;  Surgeon: Nicholes Stairs, MD;  Location: [facility] ORS;  Service: Orthopedics;  Laterality: Left;  120 mins  . MASTECTOMY  1997    OB History    No data available       Home Medications    Prior to Admission medications   Medication Sig Start Date End Date Taking? Authorizing Provider  ALPRAZolam (XANAX) 0.25 MG tablet Take 0.125 mg 2 (two) times daily by mouth.    [provider]  aspirin 81 MG tablet Take 81 mg by mouth daily.    [provider]  docusate sodium (COLACE) 100 MG capsule Take 200 mg at bedtime by mouth.    [provider]  esomeprazole (NEXIUM) 20 MG capsule Take 40 mg daily at 12 noon by mouth.    [provider]  feeding supplement, ENSURE ENLIVE, (ENSURE ENLIVE) LIQD Take 237 mLs 2 (two) times daily between meals by mouth. Patient taking differently: Take 237 mLs daily by mouth.  01/20/17   Janece Canterbury, MD  fentaNYL (DURAGESIC - DOSED MCG/HR) 50 MCG/HR Place 1 patch (50 mcg total) every 3 (three) days onto the skin. 01/20/17   Janece Canterbury, MD    furosemide (LASIX) 40 MG tablet Take 1 tablet (40 mg total) daily for 7 days by mouth. 01/20/17 01/27/17  Janece Canterbury, MD  levothyroxine (SYNTHROID, LEVOTHROID) 75 MCG tablet Take 0.5 tablets (37.5 mcg total) daily before breakfast by mouth. 01/21/17   Janece Canterbury, MD  LORazepam (ATIVAN) 0.5 MG tablet Take 1 tablet (0.5 mg total) 2 (two) times daily by mouth. Patient not taking: Reported on 01/22/2017 01/20/17   Janece Canterbury, MD  nicotine (NICODERM CQ - DOSED IN MG/24 HOURS) 21 mg/24hr patch Place 1 patch (21 mg total) daily onto the skin. 01/21/17   Janece Canterbury, MD  oxyCODONE (OXY IR/ROXICODONE) 5 MG immediate release tablet Take 1 tablet (5 mg total) every 4 (four) hours as needed by mouth for moderate pain. 01/20/17   Janece Canterbury, MD  oxyCODONE (OXY IR/ROXICODONE) 5 MG immediate release tablet Take 1-2 tablets (5-10 mg total) by mouth every 3 (three) hours as needed for breakthrough pain. 01/24/17   Nicholes Stairs, MD  PARoxetine (PAXIL) 20 MG tablet Take 1 tablet (20 mg total) daily by mouth. 01/21/17   Janece Canterbury, MD  potassium chloride SA (K-DUR,KLOR-CON) 20 MEQ tablet Take 1 tablet (20 mEq total) daily by mouth. 01/21/17   Janece Canterbury, MD    Family History Family History  Problem Relation Age of Onset  . Heart attack Father 50  . Esophageal cancer Brother   . Fainting Sister        Had a pacemaker put in  . Stroke Paternal Grandfather     Social History Social History   Tobacco Use  . Smoking status: Former Smoker    Packs/day: 1.00    Years: 40.00    Pack years: 40.00    Types: Cigarettes    Last attempt to quit: 01/12/2017    Years since quitting: 0.0  . Smokeless tobacco: Never Used  Substance Use Topics  . Alcohol use: Yes    Alcohol/week: 0.0 oz    Comment: Minimal  . Drug use: No     Allergies   Crestor [rosuvastatin calcium]; Ezetimibe; Lorazepam; and Sulfa antibiotics   Review of Systems Review of Systems  All  other systems reviewed and are negative.    Physical Exam Updated Vital Signs BP 129/82 (BP Location: Left Arm)   Pulse 92   Temp 97.8 F (36.6 C) (Oral)   Resp 18   Ht 5\' 6"  (1.676 m)   Wt 79.4 kg (175 lb)   SpO2 98%   BMI 28.25 kg/m   Physical Exam  Nursing note and vitals reviewed.  66 year old female, resting comfortably and in no acute distress. Vital signs are normal. Oxygen saturation is 98%, which is normal. Head is normocephalic and atraumatic. PERRLA, EOMI. Oropharynx is clear. Neck is nontender and supple  without adenopathy or JVD. Back is nontender and there is no CVA tenderness. Lungs are clear without rales, wheezes, or rhonchi. Chest is nontender. Heart has regular rate and rhythm without murmur. Abdomen is soft, flat, nontender without masses or hepatosplenomegaly and peristalsis is normoactive. Rectal: Decreased sphincter tone.  Soft fecal impaction present with a light brown stool. Extremities have no cyanosis or edema, full range of motion is present.  Surgical dressings in place lateral aspect of left thigh and these are not removed. Skin is warm and dry without rash. Neurologic: Mental status is normal, cranial nerves are intact, there are no motor or sensory deficits.  ED Treatments / Results   Procedures Procedures (including critical care time)  Medications Ordered in ED Medications  magnesium citrate solution 1 Bottle (not administered)     Initial Impression / Assessment and Plan / ED Course  I have reviewed the triage vital signs and the nursing notes.  Pertinent labs & imaging results that were available during my care of the patient were reviewed by me and considered in my medical decision making (see chart for details).  Fecal impaction secondary to narcotic use postop intramedullary nail left femur.  Old records are reviewed confirming above-noted surgical procedure on November 20.  She is given a soapsuds enema.  She was able to pass  a small amount of stool following the soapsuds enema.  She is complaining of a lot of rectal pain and is not willing to proceed with manual disimpaction.  She is given a dose of magnesium citrate to take when she gets home.  If that fails to give adequate results, she is also given a prescription for GoLytely to use if she does not get adequate results from magnesium citrate.  Final Clinical Impressions(s) / ED Diagnoses   Final diagnoses:  Fecal impaction in rectum (HCC)  Constipation due to opioid therapy    ED Discharge Orders        Ordered    PEG 3350-KCl-NaBcb-NaCl-NaSulf (PEG 3350/ELECTROLYTES) 240 g SOLR     74/16/38 4536       Delora Fuel, MD 46/80/32 0302

## 2017-02-05 ENCOUNTER — Telehealth: Payer: Self-pay | Admitting: *Deleted

## 2017-02-05 NOTE — Telephone Encounter (Signed)
Returning Julie's phone call. Left message for her to call [phone].

## 2017-02-05 NOTE — Telephone Encounter (Signed)
Per Dr. Alen Blew, she needs to call Dr. Corine Shelter since, this is due to surgery. Also, she needs to follow up with Dr. Lindi Adie for past history of breast cancer.

## 2017-02-05 NOTE — Telephone Encounter (Signed)
Recieved call from Letta Pate regarding patient's pain. Almyra Free stated,"she is taking Oxycodone 5 mg tablets and Fentanyl 50 mcg patch. The oxycodone is 1-2 tablets every 4 hours as needed for pain. She sees Dr. Corine Shelter tomorrow to have the sutures out. Paschal Dopp that I need to talk with Dr. Alen Blew regarding pain medications since, that is from surgery and not him. She verbalized understanding.

## 2017-02-06 ENCOUNTER — Encounter: Payer: Self-pay | Admitting: *Deleted

## 2017-02-06 ENCOUNTER — Telehealth: Payer: Self-pay | Admitting: *Deleted

## 2017-02-06 ENCOUNTER — Encounter: Payer: Self-pay | Admitting: Physician Assistant

## 2017-02-06 ENCOUNTER — Telehealth: Payer: Self-pay

## 2017-02-06 MED ORDER — LOSARTAN POTASSIUM 25 MG PO TABS: 25.0000 mg | ORAL_TABLET | Freq: Every day | ORAL | 3 refills | Status: DC

## 2017-02-06 NOTE — Telephone Encounter (Signed)
DPR on file to s/w pt's sister-in-law Almyra Free. Brantley Fling per Truitt Merle, NP who is monitoring Richardson Dopp, [place] [facility], let's restart pt on Losartan 25 mg daily. Advised to monitor BP and call the office if consistently 150/90 or higher or if consistently running low in the hospital. Almyra Free thanked me for my call. I asked if new Rx was needed. Almyra Free said she thought pt had some at home though not sure of the MG. I advised looks like what I saw last was Losartan 50 mg tablet, if this is so pt can take 1/2 tablet daily or I can just send in a new Rx for the Losartan 25 mg tablet. Almyra Free will look and call me later if needing Rx. Almyra Free thanked me for our help today.

## 2017-02-06 NOTE — Telephone Encounter (Signed)
Dr Stann Mainland from orthopedics called requesting pt be seen by breast MD. Noted another telephone message 02/06/17 by Randolm Idol RN about the same issue. Did leave VM with Bary Castilla navigator about getting this pt an appt with breast oncologist.

## 2017-02-06 NOTE — Telephone Encounter (Signed)
Patient's sister Almyra Free calling for an appt with dr Alen Blew, for pain control and hospital f/u. Per dr Alen Blew, he has sent a request to schedulers to have patient f/u with dr Lindi Adie, whom she has seen in the past. Pain issue should be f/u with surgeon, from recent surgery. Almyra Free verbalized understanding.

## 2017-02-09 ENCOUNTER — Other Ambulatory Visit: Payer: Self-pay

## 2017-02-13 ENCOUNTER — Other Ambulatory Visit: Payer: PPO

## 2017-02-13 ENCOUNTER — Other Ambulatory Visit: Payer: Self-pay | Admitting: *Deleted

## 2017-02-13 ENCOUNTER — Ambulatory Visit (HOSPITAL_BASED_OUTPATIENT_CLINIC_OR_DEPARTMENT_OTHER): Payer: PPO | Admitting: Oncology

## 2017-02-13 VITALS — BP 124/88 | HR 102 | Temp 97.7°F | Resp 13 | Ht 66.0 in

## 2017-02-13 DIAGNOSIS — Z79811 Long term (current) use of aromatase inhibitors: Secondary | ICD-10-CM | POA: Diagnosis not present

## 2017-02-13 DIAGNOSIS — C50912 Malignant neoplasm of unspecified site of left female breast: Secondary | ICD-10-CM

## 2017-02-13 DIAGNOSIS — Z171 Estrogen receptor negative status [ER-]: Secondary | ICD-10-CM

## 2017-02-13 DIAGNOSIS — G893 Neoplasm related pain (acute) (chronic): Secondary | ICD-10-CM

## 2017-02-13 DIAGNOSIS — E039 Hypothyroidism, unspecified: Secondary | ICD-10-CM

## 2017-02-13 DIAGNOSIS — C50812 Malignant neoplasm of overlapping sites of left female breast: Secondary | ICD-10-CM

## 2017-02-13 DIAGNOSIS — C50911 Malignant neoplasm of unspecified site of right female breast: Secondary | ICD-10-CM

## 2017-02-13 DIAGNOSIS — C7951 Secondary malignant neoplasm of bone: Secondary | ICD-10-CM

## 2017-02-13 DIAGNOSIS — Z853 Personal history of malignant neoplasm of breast: Secondary | ICD-10-CM | POA: Diagnosis not present

## 2017-02-13 LAB — CBC WITH DIFFERENTIAL/PLATELET
BASO%: 0.5 % (ref 0.0–2.0)
Basophils Absolute: 0 10*3/uL (ref 0.0–0.1)
EOS%: 2.7 % (ref 0.0–7.0)
Eosinophils Absolute: 0.2 10*3/uL (ref 0.0–0.5)
HCT: 36.5 % (ref 34.8–46.6)
HGB: 12.5 g/dL (ref 11.6–15.9)
LYMPH#: 1.9 10*3/uL (ref 0.9–3.3)
LYMPH%: 23.2 % (ref 14.0–49.7)
MCH: 30.6 pg (ref 25.1–34.0)
MCHC: 34.2 g/dL (ref 31.5–36.0)
MCV: 89.2 fL (ref 79.5–101.0)
MONO#: 0.7 10*3/uL (ref 0.1–0.9)
MONO%: 8.3 % (ref 0.0–14.0)
NEUT%: 65.3 % (ref 38.4–76.8)
NEUTROS ABS: 5.3 10*3/uL (ref 1.5–6.5)
Platelets: 265 10*3/uL (ref 145–400)
RBC: 4.09 10*6/uL (ref 3.70–5.45)
RDW: 15.7 % — ABNORMAL HIGH (ref 11.2–14.5)
WBC: 8.1 10*3/uL (ref 3.9–10.3)

## 2017-02-13 LAB — COMPREHENSIVE METABOLIC PANEL
ALBUMIN: 3.3 g/dL — AB (ref 3.5–5.0)
ALK PHOS: 125 U/L (ref 40–150)
ALT: 7 U/L (ref 0–55)
AST: 22 U/L (ref 5–34)
Anion Gap: 13 mEq/L — ABNORMAL HIGH (ref 3–11)
BILIRUBIN TOTAL: 0.56 mg/dL (ref 0.20–1.20)
BUN: 10.9 mg/dL (ref 7.0–26.0)
CALCIUM: 10.6 mg/dL — AB (ref 8.4–10.4)
CO2: 23 mEq/L (ref 22–29)
Chloride: 92 mEq/L — ABNORMAL LOW (ref 98–109)
Creatinine: 0.8 mg/dL (ref 0.6–1.1)
GLUCOSE: 87 mg/dL (ref 70–140)
Potassium: 4.5 mEq/L (ref 3.5–5.1)
SODIUM: 129 meq/L — AB (ref 136–145)
TOTAL PROTEIN: 7.3 g/dL (ref 6.4–8.3)

## 2017-02-13 LAB — DRAW EXTRA CLOT TUBE

## 2017-02-13 MED ORDER — FENTANYL 75 MCG/HR TD PT72: 75.0000 ug | MEDICATED_PATCH | TRANSDERMAL | 0 refills | Status: AC

## 2017-02-13 MED ORDER — OXYCODONE HCL 10 MG PO TABS: 10.0000 mg | ORAL_TABLET | ORAL | 0 refills | Status: DC | PRN

## 2017-02-13 MED ORDER — ANASTROZOLE 1 MG PO TABS: 1.0000 mg | ORAL_TABLET | Freq: Every day | ORAL | 6 refills | Status: AC

## 2017-02-13 NOTE — Progress Notes (Signed)
[place]  [phone] [phone]     ID: Makynleigh Breslin DOB: Mar 08, 1950  MR#: [id number]  ANV#:[id number]  Patient Care Team: Aletha Halim PA-C as PCP - General (Family Medicine) Apryll Hinkle, Virgie Dad, MD as Consulting Physician (Oncology) Susa Day, MD as Consulting Physician (Orthopedic Surgery) Nicholes Stairs, MD as Consulting Physician (Orthopedic Surgery) Shawnie Dapper OTHER MD:  CHIEF COMPLAINT: Metastatic breast cancer  CURRENT TREATMENT: Anastrozole, zolendronate   HISTORY OF CURRENT ILLNESS: Yuli has a history of breast cancer dating back to 1997.  She had a left lumpectomy and axillary lymph node dissection, with 10 out of 11 axillary lymph nodes involved.  She received adjuvant chemotherapy (cyclophosphamide/methotrexate/fluorouracil) and adjuvant radiation (30 fractions).  Dr. Victorino Sparrow was her oncologist at that time.  She did not receive a antiestrogens, so presumably the cancer was estrogen and progesterone receptor negative.  More recently she has had increased fatigue, and then lower back and left hip pain.  She was evaluated by Dr. Erasmo Score with an MRI of the lumbar spine 01/03/2017.  There was a large bony lesion at L4, with no evidence of cord compression.  There was a large left femoral lesion of concern for impending fracture.    On 01/15/2017 she underwent biopsy of the L4 left transverse process, showing ([id number]) metastatic carcinoma, cytokeratin 7 and 20 positive, with weak focal positivity for the estrogen receptor, negative for the progesterone receptor and for gross cystic disease fluid protein.  This was felt to be consistent although not definitively diagnostic of metastatic breast cancer  CT scans of the left femur also showed a pathologic impending fracture of the left femur.  On 01/23/2017 she underwent left femoral intramedullary nailing under Dr. Stann Mainland  Keep had a PET scan 01/09/2017  which in addition to the L4 lesion and sternal, left acetabular and left distal femur lesions, as well as other smaller bone lesions, showed a 2.3 cm right adrenal nodule with an SUV max of 26.3; there was also a hypermetabolic left adrenal mass with an SUV max of 16.6.  There was small but hypermetabolic adenopathy in the right supraclavicular area, right subpectoral area, and right hilar areas.  On 01/18/2017 Dr. Alen Blew discussed with the patient different treatment approaches including a more aggressive approach including chemotherapy versus a more conservative approach leading to hospice.  The patient is here today for a second opinion.  INTERVAL HISTORY: I met with the patient in the breast clinic 02/13/2017. She is accompanied by her sister-in-law Shaakira Borrero.    REVIEW OF SYSTEMS: Jordi reports that she is having excruciating pain that extends from her left side of her back down her left leg. She is having difficulty walking and using her left leg due to this pain. She notes that the pain is so horrible that she screams and cries. She notes that she has had this pain since having the biopsy of the L4 transverse process. She notes that she is in physical therapy, in which she is able to complete some of the exercises, but this week she is barely able to walk. She notes that she is following up with Dr. Shelda Pal, who thinks her left leg is doing okay. She notes that he also said that she has a slight curvature of the spine, and she wears a spinal brace to aid with posture and pain. She reports that she is able to get up at some points of the day, but her sister-in-law and nephew  have to help her in and out of bed. For the pain, she takes oxycodone every 4 hours, which only takes the pain away for less than 3 hours. She reports that if she takes it in the morning, the pain goes away for only about an hour. She reports that even after taking the pain medication, she is for the most part only able to  continue sitting in her chair. She is not able to do any other physical activities. She also reports that her bowel movement have not been regular, because she has not been eating well. She takes Miralax twice for a couple of days and alternates this with stool softeners. She limits this in order to not become incompetent with stools. She also reports some memory loss. She notes that she had an aggressive form of breast caner in 1997 who was followed by Dr. Venia Minks and Dr. Blair Promise. She denies unusual headaches, visual changes, nausea, vomiting, or dizziness. There has been no unusual cough, phlegm production, or pleurisy. She denies unexplained fatigue or unexplained weight loss, bleeding, rash, or fever. A detailed review of systems was otherwise stable.    PAST MEDICAL HISTORY: Past Medical History:  Diagnosis Date  . Anxiety   . Breast cancer ([place])   . Depression   . FHx: chemotherapy 1998   Radiation left in chest in 2008  . Gastroesophageal reflux disease   . History of percutaneous left heart catheterization September 2009   This showed only luminal irregularities, EF was 60%   . History of stress test    ETT 6/17: Poor Ex capacity, Ex 3' (4.6 METs), no ischemic ST changes, hypertensive BP response // ETT-Echo 8/17: normal  . Hyperlipidemia    unable to tolerate Crestor, Lipitor, or pravastatin  . Hypertension   . Hypothyroidism   . Mild mitral regurgitation by prior echocardiogram September 2009   Showed an EF of 55-60%  . Palpitations   . Supraventricular tachycardia ([place])    The patient had an episode of SVT in September 2009. Her heart rate was reported  to be around 240 by EMS., However, there were no strips to evaluate. It did terminate with  adenosine. 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 The patient did have an event monitor done in October 2009 that showed only normal sinus rhythm.   . Tobacco user     PAST SURGICAL HISTORY: Past Surgical History:  Procedure Laterality Date    . CARDIAC CATHETERIZATION  12/02/2006   Eustace Quail, MD. Lumpectomy and radical nide dissection of the left breast  . FEMUR IM NAIL Left 01/23/2017   Procedure: INTRAMEDULLARY (IM) NAIL FEMORAL;  Surgeon: Nicholes Stairs, MD;  Location: [facility];  Service: Orthopedics;  Laterality: Left;  120 mins  . MASTECTOMY  1997    FAMILY HISTORY Family History  Problem Relation Age of Onset  . Heart attack Father 70  . Esophageal cancer Brother   . Fainting Sister        Had a pacemaker put in  . Stroke Paternal Grandfather    her mother died from a heart problem at 61. Her father, who was a smoker, died from lung cancer in his 61's.  Patient's husband died from esophageal cancer--he was Julie's brother and my patient.  Anahla has 1 sister aged 32, living in a nursing home.  Baneen had 1 aunt with breast cancer, who was diagnosed in her 44's.  There is no history of ovarian cancer in the  GYNECOLOGIC HISTORY:  No LMP recorded. Patient is postmenopausal. Menarche at 32 GXP0 LMP at 78.  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  She reports that her husband was an Chief Financial Officer. After he moved back from [place], they got a divorce. She reports that her sister-in-law Chanci Ojala and her son stay with her most days, and there is also a lady higher to stay with her at night sometimes.  Yuleni has 24-hour care at present in this fashion. Almyra Free owns and runs a night container service and her son helps her.    ADVANCED DIRECTIVES:  Yadhira Mckneely is her power of attorney. Her phone number is [phone].    HEALTH MAINTENANCE: Social History   Tobacco Use  . Smoking status: Former Smoker    Packs/day: 1.00    Years: 40.00    Pack years: 40.00    Types: Cigarettes    Last attempt to quit: 01/12/2017    Years since quitting: 0.0  . Smokeless tobacco: Never Used  Substance Use Topics  . Alcohol use: Yes    Alcohol/week: 0.0 oz     Comment: Minimal  . Drug use: No     Colonoscopy:  PAP:  Bone density:   Allergies  Allergen Reactions  . Crestor [Rosuvastatin Calcium] Other (See Comments)    Myalgias  . Ezetimibe     GI Upset (intolerance)  . Lorazepam     Loopy, hallucinations, memory loss   . Sulfa Antibiotics     unknown    Current Outpatient Medications  Medication Sig Dispense Refill  . ALPRAZolam (XANAX) 0.25 MG tablet Take 0.125 mg 2 (two) times daily by mouth.    Marland Kitchen aspirin 81 MG tablet Take 81 mg by mouth daily.    Marland Kitchen docusate sodium (COLACE) 100 MG capsule Take 200 mg at bedtime by mouth.    . esomeprazole (NEXIUM) 20 MG capsule Take 40 mg daily at 12 noon by mouth.    . feeding supplement, ENSURE ENLIVE, (ENSURE ENLIVE) LIQD Take 237 mLs 2 (two) times daily between meals by mouth. (Patient taking differently: Take 237 mLs daily by mouth. ) 60 Bottle 0  . fentaNYL (DURAGESIC - DOSED MCG/HR) 50 MCG/HR Place 1 patch (50 mcg total) every 3 (three) days onto the skin. 5 patch 0  . levothyroxine (SYNTHROID, LEVOTHROID) 75 MCG tablet Take 0.5 tablets (37.5 mcg total) daily before breakfast by mouth. 15 tablet 0  . nicotine (NICODERM CQ - DOSED IN MG/24 HOURS) 21 mg/24hr patch Place 1 patch (21 mg total) daily onto the skin. 28 patch 0  . oxyCODONE (OXY IR/ROXICODONE) 5 MG immediate release tablet Take 1 tablet (5 mg total) every 4 (four) hours as needed by mouth for moderate pain. 20 tablet 0  . PARoxetine (PAXIL) 20 MG tablet Take 1 tablet (20 mg total) daily by mouth. 30 tablet 0   No current facility-administered medications for this visit.     OBJECTIVE: Middle-aged white woman examined in a wheelchair  Vitals:   02/13/17 1603  BP: 124/88  Pulse: (!) 102  Resp: 13  Temp: 97.7 F (36.5 C)  SpO2: 97%     Body mass index is 28.25 kg/m.   Wt Readings from Last 3 Encounters:  01/31/17 175 lb (79.4 kg)  01/23/17 175 lb (79.4 kg)  01/19/17 180 lb (81.6 kg)      ECOG FS:2 - Symptomatic, <50%  confined to bed  Ocular: Sclerae unicteric, pupils  round and equal Ear-nose-throat: Oropharynx clear  Lymphatic: No cervical or supraclavicular adenopathy Lungs no rales or rhonchi Heart regular rate and rhythm Abd soft, nontender, positive bowel sounds MSK the manubrium of the sternum is clearly distorted from a bony mass Neuro: non-focal, well-oriented, appropriate affect Breasts: The right breast is unremarkable.  The left breast is status post remote lumpectomy and radiation.  The left axilla is clear   LAB RESULTS:  CMP     Component Value Date/Time   NA 129 (L) 02/13/2017 1529   K 4.5 02/13/2017 1529   CL 97 (L) 01/25/2017 0617   CO2 23 02/13/2017 1529   GLUCOSE 87 02/13/2017 1529   BUN 10.9 02/13/2017 1529   CREATININE 0.8 02/13/2017 1529   CALCIUM 10.6 (H) 02/13/2017 1529   PROT 7.3 02/13/2017 1529   ALBUMIN 3.3 (L) 02/13/2017 1529   AST 22 02/13/2017 1529   ALT 7 02/13/2017 1529   ALKPHOS 125 02/13/2017 1529   BILITOT 0.56 02/13/2017 1529   GFRNONAA >60 01/25/2017 0617   GFRAA >60 01/25/2017 0617    No results found for: TOTALPROTELP, ALBUMINELP, A1GS, A2GS, BETS, BETA2SER, GAMS, MSPIKE, SPEI  No results found for: KPAFRELGTCHN, LAMBDASER, KAPLAMBRATIO  Lab Results  Component Value Date   WBC 8.1 02/13/2017   NEUTROABS 5.3 02/13/2017   HGB 12.5 02/13/2017   HCT 36.5 02/13/2017   MCV 89.2 02/13/2017   PLT 265 02/13/2017    _0 @  No results found for: LABCA2  No components found for: OFBPZW258  No results for input(s): INR in the last 168 hours.  No results found for: LABCA2  No results found for: NID782  No results found for: UMP536  No results found for: RWE315  No results found for: CA2729  No components found for: HGQUANT  No results found for: CEA1 / No results found for: CEA1   No results found for: AFPTUMOR  No results found for: [place]  No results found for: PSA1  Appointment on 02/13/2017  Component Date  Value Ref Range Status  . Extra Tube 02/13/2017 Sample held in lab for 7 days   Final  . Sodium 02/13/2017 129* 136 - 145 mEq/L Final  . Potassium 02/13/2017 4.5  3.5 - 5.1 mEq/L Final  . Chloride 02/13/2017 92* 98 - 109 mEq/L Final  . CO2 02/13/2017 23  22 - 29 mEq/L Final  . Glucose 02/13/2017 87  70 - 140 mg/dl Final   Glucose reference range is for nonfasting patients. Fasting glucose reference range is 70- 100.  Marland Kitchen BUN 02/13/2017 10.9  7.0 - 26.0 mg/dL Final  . Creatinine 02/13/2017 0.8  0.6 - 1.1 mg/dL Final  . Total Bilirubin 02/13/2017 0.56  0.20 - 1.20 mg/dL Final  . Alkaline Phosphatase 02/13/2017 125  40 - 150 U/L Final  . AST 02/13/2017 22  5 - 34 U/L Final  . ALT 02/13/2017 7  0 - 55 U/L Final  . Total Protein 02/13/2017 7.3  6.4 - 8.3 g/dL Final  . Albumin 02/13/2017 3.3* 3.5 - 5.0 g/dL Final  . Calcium 02/13/2017 10.6* 8.4 - 10.4 mg/dL Final  . Anion Gap 02/13/2017 13* 3 - 11 mEq/L Final  . EGFR 02/13/2017 >60  >60 ml/min/1.73 m2 Final   eGFR is calculated using the CKD-EPI Creatinine Equation (2009)  . WBC 02/13/2017 8.1  3.9 - 10.3 10e3/uL Final  . NEUT# 02/13/2017 5.3  1.5 - 6.5 10e3/uL Final  . HGB 02/13/2017 12.5  11.6 - 15.9 g/dL Final  .  HCT 02/13/2017 36.5  34.8 - 46.6 % Final  . Platelets 02/13/2017 265  145 - 400 10e3/uL Final  . MCV 02/13/2017 89.2  79.5 - 101.0 fL Final  . MCH 02/13/2017 30.6  25.1 - 34.0 pg Final  . MCHC 02/13/2017 34.2  31.5 - 36.0 g/dL Final  . RBC 02/13/2017 4.09  3.70 - 5.45 10e6/uL Final  . RDW 02/13/2017 15.7* 11.2 - 14.5 % Final  . lymph# 02/13/2017 1.9  0.9 - 3.3 10e3/uL Final  . MONO# 02/13/2017 0.7  0.1 - 0.9 10e3/uL Final  . Eosinophils Absolute 02/13/2017 0.2  0.0 - 0.5 10e3/uL Final  . Basophils Absolute 02/13/2017 0.0  0.0 - 0.1 10e3/uL Final  . NEUT% 02/13/2017 65.3  38.4 - 76.8 % Final  . LYMPH% 02/13/2017 23.2  14.0 - 49.7 % Final  . MONO% 02/13/2017 8.3  0.0 - 14.0 % Final  . EOS% 02/13/2017 2.7  0.0 - 7.0 % Final  .  BASO% 02/13/2017 0.5  0.0 - 2.0 % Final    (this displays the last labs from the last 3 days)  No results found for: TOTALPROTELP, ALBUMINELP, A1GS, A2GS, BETS, BETA2SER, GAMS, MSPIKE, SPEI (this displays SPEP labs)  No results found for: KPAFRELGTCHN, LAMBDASER, KAPLAMBRATIO (kappa/lambda light chains)  No results found for: HGBA, HGBA2QUANT, HGBFQUANT, HGBSQUAN (Hemoglobinopathy evaluation)   No results found for: LDH  No results found for: IRON, TIBC, IRONPCTSAT (Iron and TIBC)  No results found for: FERRITIN  Urinalysis    Component Value Date/Time   COLORURINE YELLOW (A) 01/15/2017 0445   APPEARANCEUR TURBID (A) 01/15/2017 0445   LABSPEC 1.014 01/15/2017 [place] 5.0 01/15/2017 [place] 01/15/2017 [place] (A) 01/15/2017 [place] 01/15/2017 [place] 01/15/2017 0445   PROTEINUR 30 (A) 01/15/2017 0445   NITRITE POSITIVE (A) 01/15/2017 0445   LEUKOCYTESUR LARGE (A) 01/15/2017 0445     STUDIES: Mr Jeri Cos Wo Contrast  Result Date: 01/15/2017 CLINICAL DATA:  Staging.  Metastatic disease.  Breast cancer. EXAM: MRI HEAD WITHOUT AND WITH CONTRAST TECHNIQUE: Multiplanar, multiecho pulse sequences of the brain and surrounding structures were obtained without and with intravenous contrast. CONTRAST:  16m MULTIHANCE GADOBENATE DIMEGLUMINE 529 MG/ML IV SOLN COMPARISON:  PET scan from 01/09/2017 demonstrates osseous metastatic disease to the lumbar spine and pelvis FINDINGS: Brain: No evidence for acute infarction, hemorrhage, mass lesion, hydrocephalus, or extra-axial fluid. Mild atrophy is noted. Mild subcortical and periventricular T2 and FLAIR hyperintensities, likely chronic microvascular ischemic change. Post infusion images are motion degraded, but overall, diagnostic. Post infusion, no abnormal enhancement of the brain or meninges. Vascular: Flow voids are maintained throughout the carotid, basilar, and  vertebral arteries. There are no areas of chronic hemorrhage. Skull and upper cervical spine: Unremarkable visualized calvarium, skullbase, and cervical vertebrae. Pituitary, pineal, cerebellar tonsils unremarkable. No upper cervical cord lesions. Sinuses/Orbits: Hypoplastic RIGHT maxillary sinus. No layering fluid. Negative orbits. Other: None. IMPRESSION: Mild atrophy and small vessel disease. No acute intracranial findings. Specifically, no evidence for intracranial metastatic disease. Electronically Signed   By: JStaci RighterM.D.   On: 01/15/2017 12:14   Ct Biopsy  Result Date: 01/15/2017 INDICATION: 66year old female with history of breast cancer. Patient now has multiple destructive bone lesions and needs a tissue diagnosis. EXAM: CT-GUIDED CORE BIOPSIES OF THE L4 LESION MEDICATIONS: None. ANESTHESIA/SEDATION: Moderate (conscious) sedation was employed during this procedure. A total of Versed 3.0 mg and Fentanyl 100  mcg was administered intravenously. Moderate Sedation Time: 22 minutes. The patient's level of consciousness and vital signs were monitored continuously by radiology nursing throughout the procedure under my direct supervision. FLUOROSCOPY TIME:  None COMPLICATIONS: None immediate. PROCEDURE: Informed written consent was obtained from the patient after a thorough discussion of the procedural risks, benefits and alternatives. All questions were addressed. A timeout was performed prior to the initiation of the procedure. Patient was placed prone. CT images through the lower abdomen were obtained. The lesion involving the left side of the L4 vertebral body was targeted. The patient's back was prepped with chlorhexidine and sterile field was created. Skin and soft tissues were anesthetized with 1% lidocaine. 17 gauge coaxial needle was directed onto the posterior aspect of the L4 left transverse process. Multiple core biopsies were obtained. Three adequate samples were obtained and placed in  formalin. 17 gauge needle was removed without complication. FINDINGS: Destructive soft tissue lesion involving the left side of the L4 vertebral body including the left transverse process. Core biopsies were obtained of the L4 left transverse process. IMPRESSION: CT-guided core biopsies of the L4 lesion. Electronically Signed   By: Markus Daft M.D.   On: 01/15/2017 15:55   Dg C-arm 1-60 Min-no Report  Result Date: 01/23/2017 Fluoroscopy was utilized by the requesting physician.  No radiographic interpretation.   Dg Femur Port Min 2 Views Left  Result Date: 01/23/2017 CLINICAL DATA:  Postop left femur . EXAM: LEFT FEMUR PORTABLE 2 VIEWS COMPARISON:  CT 01/13/2017. FINDINGS: ORIF left femur. Hardware intact. Anatomic alignment. No acute bony abnormality identified. Destructive lesion in the left acetabulum again noted. Peripheral vascular calcification. IMPRESSION: 1. ORIF left femur with anatomic alignment. Destructive lesion left acetabulum again noted. 2. Peripheral vascular disease . Electronically Signed   By: Marcello Moores  Register   On: 01/23/2017 15:44    ELIGIBLE FOR AVAILABLE RESEARCH PROTOCOL: no  ASSESSMENT: 66 y.o. [place] woman status post left lumpectomy in 1997 for an estrogen receptor negative tumor, TX N3 at the time, treated adjuvantly with cyclophosphamide/methotrexate/fluorouracil, then radiation  METASTATIC DISEASE: November 2018 (1) bone biopsy 02/14/2017 shows metastatic carcinoma, focally estrogen receptor positive (weak positivity)  (a) staging studies including spinal MRI 01/09/2017, PET scan 01/09/2017, and brain MRI 01/15/2017 showed no bone, adrenal and lymph node but no lung, liver or brain involvement  (2) status post left femoral intramedullary rod placement 02/22/2017  (3) poorly-controlled pain  PLAN: We spent the better part of today's hour-long appointment discussing the biology of her diagnosis and the specifics of her situation. She  understands that stage IV breast cancer is not curable with our current knowledge base. The goal of treatment is control. The strategy of treatment is to do only the minimum necessary to control the growth of the tumor so that the patient can have as normal a life as possible. There is no survival advantage in treating aggressively if treating less aggressively results in tumor control. With this strategy stage IV breast cancer in many cases can function as a "chronic illness": something that cannot be quite gotten rid of but can be controlled for an indefinite period of time  There are always 3 questions to go over and so we reviewed those today. The first question is do we treat or not. In some cases the patient's overall situation is so discouraging that palliative/comfort care alone is appropriate. If the decision is made to treat, then the next question is whether anti-estrogens or chemotherapy is more appropriate.  Once that decision is made than the third question is: Which agent or combination of agents in particular should be used?  With these questions in mind, it is clear to me that at present Crysta is not a candidate for chemotherapy.  Her functional status is very poor.  I think chemotherapy in her current situation would shorten rather than lengthen her life.    Accordingly we are putting chemotherapy aside, at least for now, and concentrating on treatments she can tolerate and that may improve her functional status.  Since the breast cancer is at least partly estrogen receptor positive, we will start anastrozole.  Today we discussed the possible toxicities, side effects and complications of this agent and the prescription was placed.  We are also going to do zolendronate every 3 months.  Hopefully we can get the first dose sometime this week.  She understands that particularly with the first dose on zolendronate can worsen bony pain initially.  In the longer run though it should help as far as  the bone pain is concerned and also help prevent further fracture problems  I am referring her to radiation oncology for consideration of palliative radiation to her areas of worse bony pain which include particularly the lower back and left leg  We are also contacting hospice.  I believe she qualifies for their in-home palliative care program.  As far as the pain is concerned I am increasing the fentanyl to 75 mg transdermally every 72 hours and increasing the oxycodone to 10 mg tablets which she can take 1 or 2 tablets every 4 hours as needed she has an excellent bowel prophylaxis regimen and this was reviewed today as well  If her pain gets better controlled, if she becomes more functional and ambulatory, then at that point, perhaps 3 months from now, we could reconsider the possibility of chemotherapy.  If however she is no better or even worse by that time, then I think a full hospice referral would be appropriate.  Jama has a good understanding of the overall plan. She agrees with it. She knows the goal of treatment in her case is cure. She will call with any problems that may develop before her next visit here.  Jemal Miskell, Virgie Dad, MD  02/13/17 4:49 PM Medical Oncology and Hematology [facility] [address] [place], [place] [address] Tel. [phone]    Fax. [phone]  This document serves as a record of services personally performed by Lurline Del, MD. It was created on his behalf by Sheron Nightingale, a trained medical scribe. The creation of this record is based on the scribe's personal observations and the provider's statements to them.    I have reviewed the above documentation for accuracy and completeness, and I agree with the above.    Marland Kitchen

## 2017-02-14 ENCOUNTER — Telehealth: Payer: Self-pay | Admitting: *Deleted

## 2017-02-14 NOTE — Telephone Encounter (Signed)
This RN referred pt for Palliative Care per [facility].

## 2017-02-15 ENCOUNTER — Telehealth: Payer: Self-pay

## 2017-02-15 DIAGNOSIS — Z87891 Personal history of nicotine dependence: Secondary | ICD-10-CM | POA: Diagnosis not present

## 2017-02-15 DIAGNOSIS — Z79891 Long term (current) use of opiate analgesic: Secondary | ICD-10-CM | POA: Diagnosis not present

## 2017-02-15 DIAGNOSIS — F419 Anxiety disorder, unspecified: Secondary | ICD-10-CM | POA: Diagnosis not present

## 2017-02-15 DIAGNOSIS — C50919 Malignant neoplasm of unspecified site of unspecified female breast: Secondary | ICD-10-CM | POA: Diagnosis not present

## 2017-02-15 DIAGNOSIS — F329 Major depressive disorder, single episode, unspecified: Secondary | ICD-10-CM | POA: Diagnosis not present

## 2017-02-15 DIAGNOSIS — K219 Gastro-esophageal reflux disease without esophagitis: Secondary | ICD-10-CM | POA: Diagnosis not present

## 2017-02-15 DIAGNOSIS — Z7982 Long term (current) use of aspirin: Secondary | ICD-10-CM | POA: Diagnosis not present

## 2017-02-15 DIAGNOSIS — C7951 Secondary malignant neoplasm of bone: Secondary | ICD-10-CM | POA: Diagnosis not present

## 2017-02-15 DIAGNOSIS — I471 Supraventricular tachycardia: Secondary | ICD-10-CM | POA: Diagnosis not present

## 2017-02-15 DIAGNOSIS — M84552D Pathological fracture in neoplastic disease, left femur, subsequent encounter for fracture with routine healing: Secondary | ICD-10-CM | POA: Diagnosis not present

## 2017-02-15 LAB — CANCER ANTIGEN 27.29: CAN 27.29: 251.6 U/mL — AB (ref 0.0–38.6)

## 2017-02-15 MED ORDER — OXYCODONE HCL 10 MG PO TABS: 10.0000 mg | ORAL_TABLET | ORAL | 0 refills | Status: AC | PRN

## 2017-02-15 NOTE — Telephone Encounter (Signed)
Rejection msg received for oxycodone 10mg  prescription d/t max amt of pills for 30 days is 180 and prescription is for 120 pills for 10 day supply.  Prior authorization process completed via telephone.  Authorization or denial should be received via fax within 24 hrs.  Relayed above information to pt's caregiver, Karelyn Brisby.  Almyra Free states pt only has 2 pills left.  Dr Jana Hakim agreeable to write a prescription for enough medication to get pt through the next 24 hrs.  This RN explained to Almyra Free that they will have to pay out of pocket for this medication.  Almyra Free agreeable to this.    This RN will follow up tomorrow on authorization approval and notify Almyra Free on 12/14

## 2017-02-15 NOTE — Addendum Note (Signed)
Addended by: Chauncey Cruel on: 02/15/2017 03:36 PM   Modules accepted: Orders

## 2017-02-16 ENCOUNTER — Telehealth: Payer: Self-pay | Admitting: Oncology

## 2017-02-16 DIAGNOSIS — G893 Neoplasm related pain (acute) (chronic): Secondary | ICD-10-CM | POA: Diagnosis not present

## 2017-02-16 NOTE — Telephone Encounter (Signed)
Scheduled appt per 12/11 and 12/14 sch message - patient is aware of appt added.

## 2017-02-19 ENCOUNTER — Telehealth: Payer: Self-pay

## 2017-02-19 ENCOUNTER — Other Ambulatory Visit: Payer: Self-pay

## 2017-02-19 MED ORDER — PREDNISONE 5 MG PO TABS: 10.0000 mg | ORAL_TABLET | Freq: Every day | ORAL | 1 refills | Status: AC

## 2017-02-19 NOTE — Telephone Encounter (Signed)
Spoke with Conni Slipper, NP for [facility] by phone regarding lack of pain control for pt.  After discussion with Dr Jana Hakim, pt will be started on methadone 10mg  TID and prednisone 10 mg daily.  Fentanyl will be discontinued.  Oxycodone will continue.  Stanton Kidney states that she will give care giver a paper script for the methadone.  This RN has sent a prescription for the prednisone to the pt's pharmacy.

## 2017-02-19 NOTE — Telephone Encounter (Signed)
Attempted to return call regarding need for better pain control per telephone advice record.  lvm with Almyra Free, pt caregiver.  No answer on pt home #  lvm on number provided for Conni Slipper, NP with [facility].

## 2017-02-20 ENCOUNTER — Telehealth: Payer: Self-pay

## 2017-02-20 NOTE — Telephone Encounter (Signed)
Confirmed with Amber at [facility] that Dr. Jana Hakim will continue as her physician during her transition and time with Hospice.  Cyndia Bent RN

## 2017-02-21 ENCOUNTER — Ambulatory Visit: Payer: PPO

## 2017-02-21 ENCOUNTER — Ambulatory Visit: Payer: Self-pay

## 2017-02-21 ENCOUNTER — Ambulatory Visit
Admission: RE | Admit: 2017-02-21 | Discharge: 2017-02-21 | Disposition: A | Discharge status: Home / Self Care | Payer: PPO | Source: Ambulatory Visit | Attending: Radiation Oncology | Admitting: Radiation Oncology

## 2017-02-23 ENCOUNTER — Telehealth: Payer: Self-pay | Admitting: *Deleted

## 2017-02-23 MED ORDER — MORPHINE SULFATE (CONCENTRATE) 20 MG/ML PO SOLN: 5.0000 mg | ORAL | 0 refills | Status: AC | PRN

## 2017-02-23 NOTE — Telephone Encounter (Signed)
This RN spoke with Thomes Dinning RN with [facility] stating pt is having noted decline as well as non verbal indications of increased discomfort ( noted elevated bp ) - family is concerned for pain control if pt becomes more unresponsive and unable to swallow her methadone.  Per above - recommended starting dose if needed as 5 mg Roxanol with concentration of 20mg /ml every 4 hours.  MD in agreement - Sam notified and prescription obtained and faxed to verified pharmacy.

## 2017-02-23 NOTE — Telephone Encounter (Signed)
"  Family cancerned about patient's pain this weekend.  Would Dr. Jana Hakim consider ordering Roxinal?  Today's visit:  B/P = 104/68 compared to 140/84.   Pulse 122 compared to 112. No compazine or methadone since 2:00 am.   Upper extremities cool to touch.  lower extremities warm. No urine output since yesterday morning. Sleeping now but concerned abut the weekend. Return number [phone]."

## 2017-02-28 ENCOUNTER — Encounter: Payer: Self-pay | Admitting: Physician Assistant

## 2017-02-28 ENCOUNTER — Encounter: Payer: Self-pay | Admitting: Oncology

## 2017-03-06 DEATH — deceased

## 2017-03-07 ENCOUNTER — Other Ambulatory Visit: Payer: Self-pay

## 2017-03-07 ENCOUNTER — Ambulatory Visit: Payer: Self-pay | Admitting: Oncology

## 2017-03-08 ENCOUNTER — Other Ambulatory Visit: Payer: Self-pay | Admitting: Oncology

## 2017-03-24 ENCOUNTER — Other Ambulatory Visit: Payer: Self-pay | Admitting: Nurse Practitioner

## 2017-04-03 ENCOUNTER — Other Ambulatory Visit: Payer: Self-pay

## 2017-04-03 ENCOUNTER — Ambulatory Visit: Payer: Self-pay | Admitting: Oncology

## 2019-04-13 IMAGING — MR MR HEAD WO/W CM
9 of 13 series · 34 of 48 positions shown · IV contrast (Yes)
Comparison: PET scan from 01/09/2017 demonstrates osseous
metastatic disease to the lumbar spine and pelvis

CLINICAL DATA: Staging.  Metastatic disease.  Breast cancer.

EXAM:
MRI HEAD WITHOUT AND WITH CONTRAST
TECHNIQUE: Multiplanar, multiecho pulse sequences of the brain and surrounding
structures were obtained without and with intravenous contrast.
CONTRAST:  17mL MULTIHANCE GADOBENATE DIMEGLUMINE 529 MG/ML IV SOLN

[Series 3: DWI · axial · 3.0mm · 1.09mm/px · z∈[-17,+121]mm · 9 of 96 slices shown (1 of 4)]
[im 1/96]
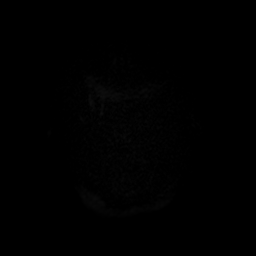
[im 12/96]
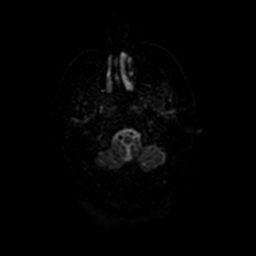
[im 24/96]
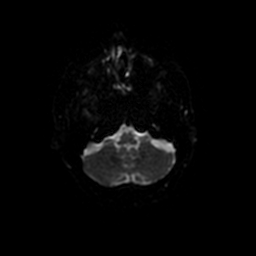
[im 36/96]
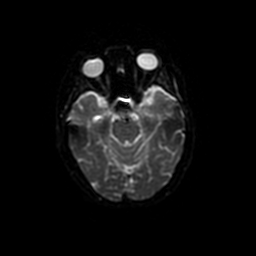
[im 48/96]
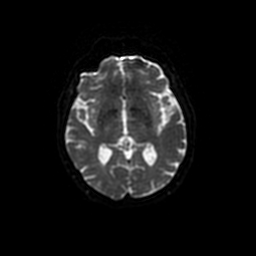
[im 60/96]
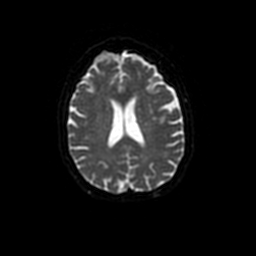
[im 72/96]
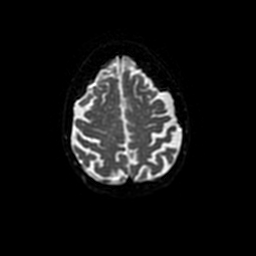
[im 84/96]
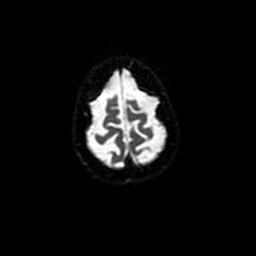
[im 96/96]
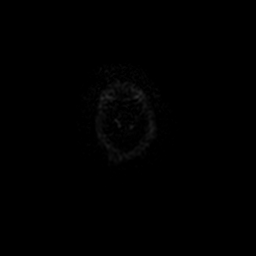

[Series 5: DWI · coronal · 5.0mm · 1.09mm/px · 6 of 64 slices shown (2 of 4)]
[im 1/64]
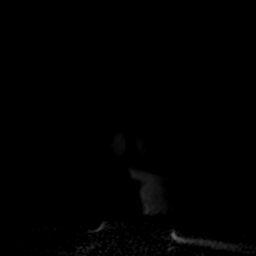
[im 13/64]
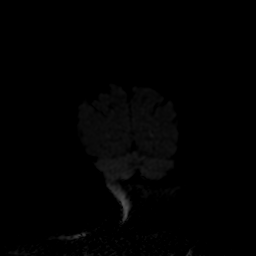
[im 26/64]
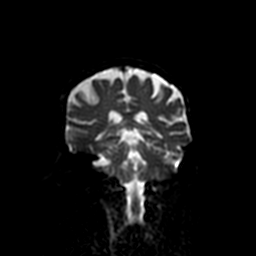
[im 38/64]
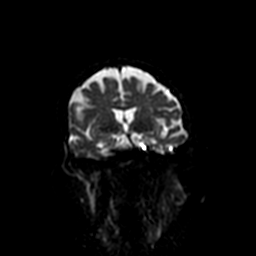
[im 51/64]
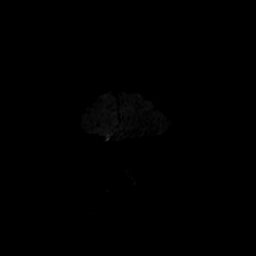
[im 64/64]
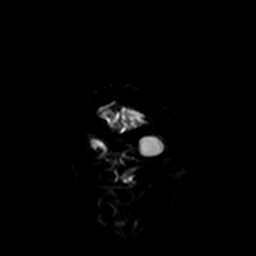

[Series 6: T2 · axial · 5.0mm · 0.43mm/px · z∈[-38,+119]mm · 3 of 28 slices shown]
[im 1/28]
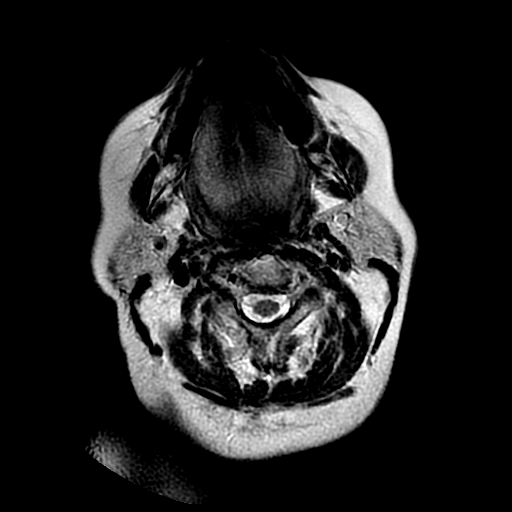
[im 14/28]
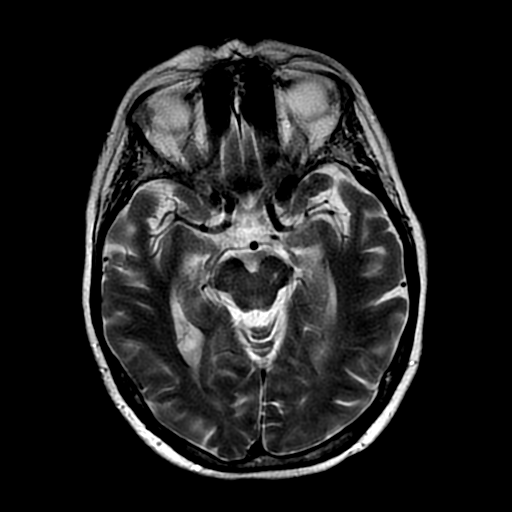
[im 28/28]
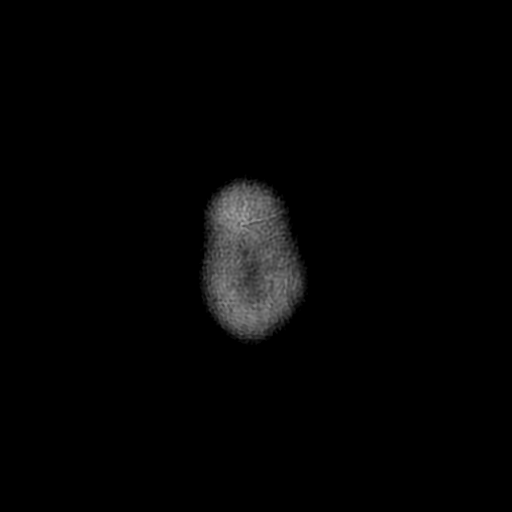

[Series 7: FLAIR · axial · 5.0mm · 0.43mm/px · z∈[-38,+119]mm · 3 of 28 slices shown]
[im 1/28]
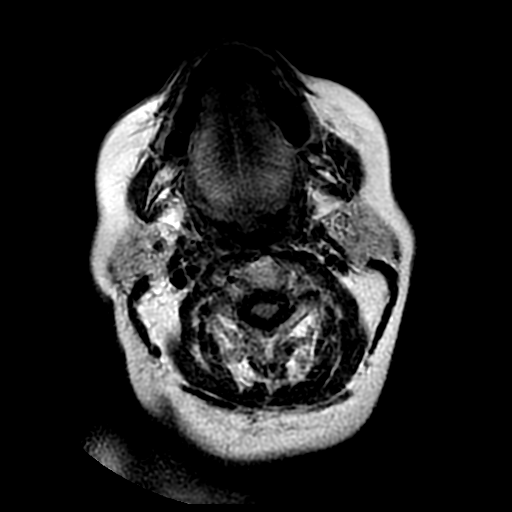
[im 14/28]
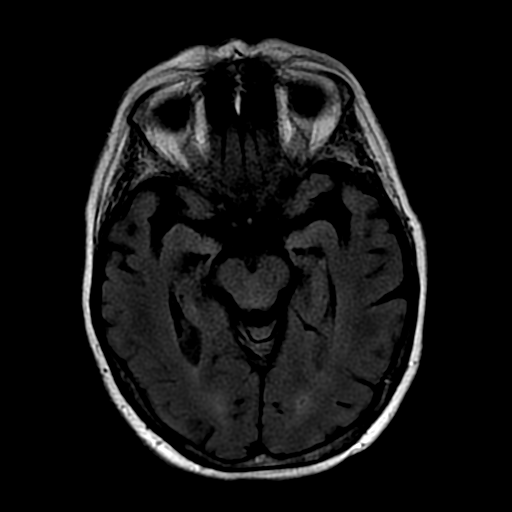
[im 28/28]
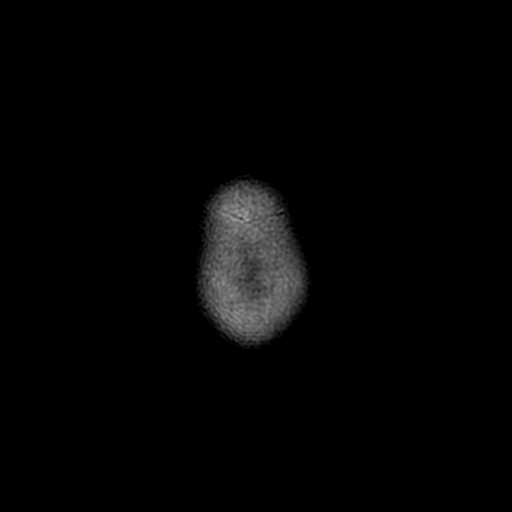

[Series 10: T2 post-contrast · coronal · 5.0mm · 0.47mm/px · 2 of 24 slices shown]
[im 1/24]
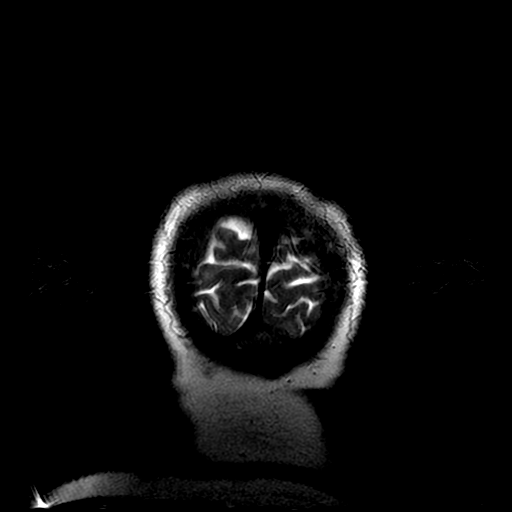
[im 24/24]
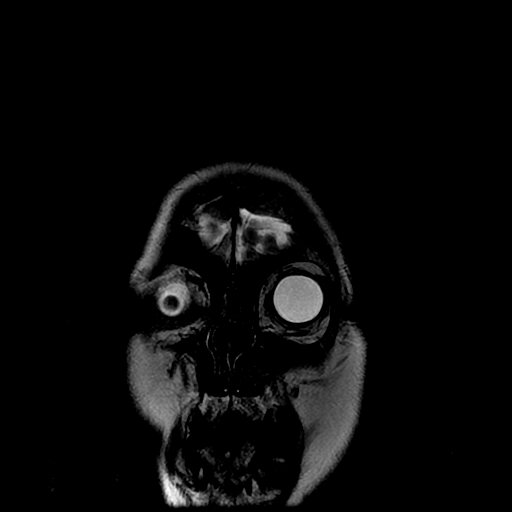

[Series 12: T1 post-contrast · coronal · 5.0mm · 0.47mm/px · 2 of 24 slices shown (1 of 2)]
[im 1/24]
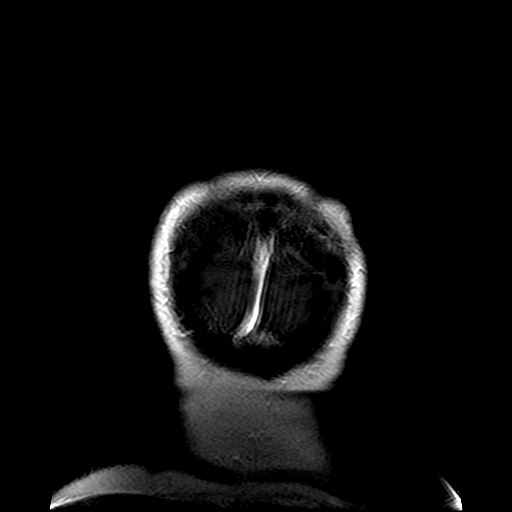
[im 24/24]
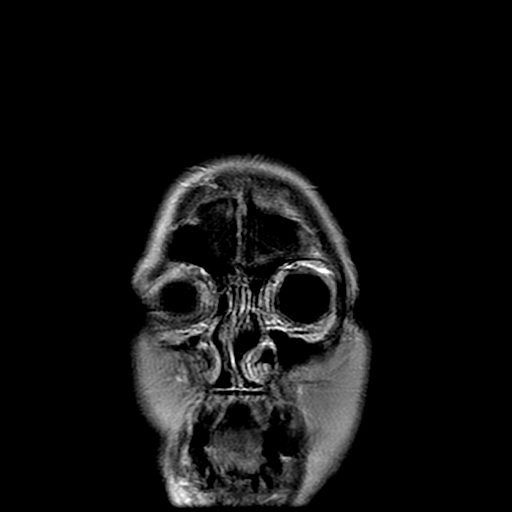

[Series 13: T1 post-contrast · sagittal · 5.0mm · 0.47mm/px · 2 of 24 slices shown (2 of 2)]
[im 1/24]
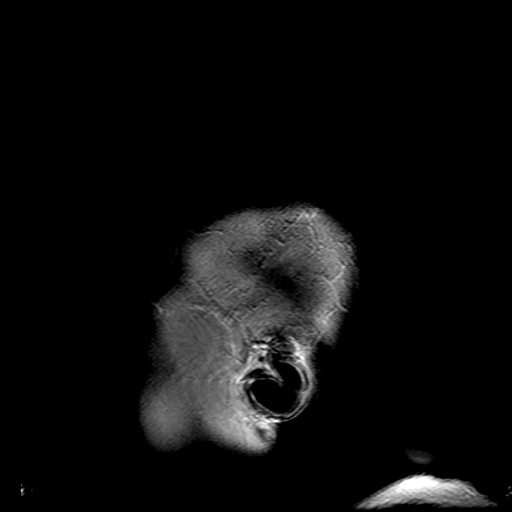
[im 24/24]
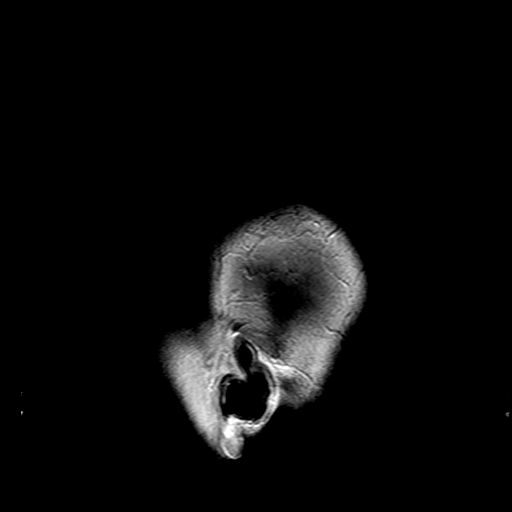

[Series 300: DWI · axial · 3.0mm · 1.09mm/px · z∈[-17,+121]mm · 4 of 48 slices shown (3 of 4)]
[im 1/48]
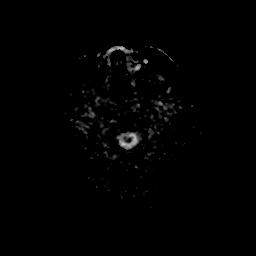
[im 16/48]
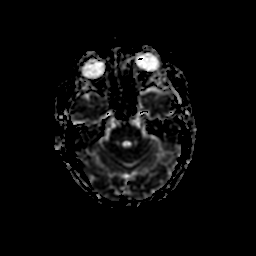
[im 32/48]
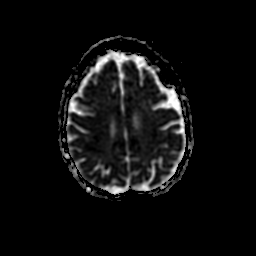
[im 48/48]
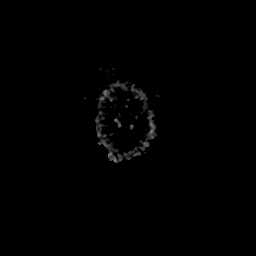

[Series 500: DWI · coronal · 5.0mm · 1.09mm/px · 3 of 32 slices shown (4 of 4)]
[im 1/32]
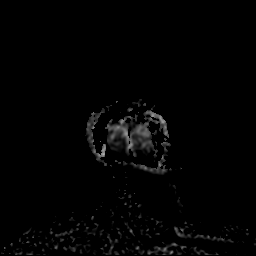
[im 16/32]
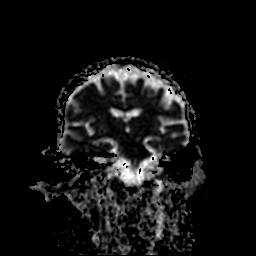
[im 32/32]
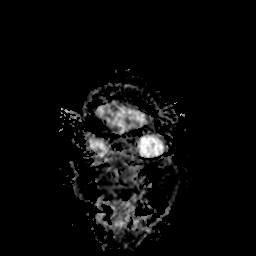

[34 of 48 positions shown; findings below may reference images not displayed]

FINDINGS: Brain: No evidence for acute infarction, hemorrhage, mass lesion,
hydrocephalus, or extra-axial fluid. Mild atrophy is noted. Mild
subcortical and periventricular T2 and FLAIR hyperintensities,
likely chronic microvascular ischemic change.

Post infusion images are motion degraded, but overall, diagnostic.
Post infusion, no abnormal enhancement of the brain or meninges.

Vascular: Flow voids are maintained throughout the carotid, basilar,
and vertebral arteries. There are no areas of chronic hemorrhage.

Skull and upper cervical spine: Unremarkable visualized calvarium,
skullbase, and cervical vertebrae. Pituitary, pineal, cerebellar
tonsils unremarkable. No upper cervical cord lesions.

Sinuses/Orbits: Hypoplastic RIGHT maxillary sinus. No layering
fluid. Negative orbits.

Other: None.
IMPRESSION: Mild atrophy and small vessel disease.

No acute intracranial findings.

Specifically, no evidence for intracranial metastatic disease.
